# Patient Record
Sex: Male | Born: 1940 | Race: Black or African American | Hispanic: No | Marital: Married | Smoking: Never smoker
Health system: Southern US, Community
[De-identification: ages and names within clinical notes are randomized; demographics above are authoritative.]

## PROBLEM LIST (undated history)

## (undated) DIAGNOSIS — R413 Other amnesia: Secondary | ICD-10-CM

## (undated) DIAGNOSIS — E119 Type 2 diabetes mellitus without complications: Secondary | ICD-10-CM

## (undated) DIAGNOSIS — E785 Hyperlipidemia, unspecified: Secondary | ICD-10-CM

## (undated) DIAGNOSIS — R001 Bradycardia, unspecified: Secondary | ICD-10-CM

## (undated) DIAGNOSIS — G459 Transient cerebral ischemic attack, unspecified: Secondary | ICD-10-CM

## (undated) DIAGNOSIS — I10 Essential (primary) hypertension: Secondary | ICD-10-CM

## (undated) DIAGNOSIS — R011 Cardiac murmur, unspecified: Secondary | ICD-10-CM

## (undated) HISTORY — DX: Hyperlipidemia, unspecified: E78.5

## (undated) HISTORY — DX: Essential (primary) hypertension: I10

## (undated) HISTORY — DX: Cardiac murmur, unspecified: R01.1

## (undated) HISTORY — DX: Type 2 diabetes mellitus without complications: E11.9

## (undated) HISTORY — DX: Bradycardia, unspecified: R00.1

---

## 1998-08-19 ENCOUNTER — Ambulatory Visit (HOSPITAL_COMMUNITY): Admission: RE | Admit: 1998-08-19 | Discharge: 1998-08-19 | Payer: Self-pay | Admitting: Gastroenterology

## 1999-04-27 ENCOUNTER — Ambulatory Visit (HOSPITAL_COMMUNITY): Admission: EM | Admit: 1999-04-27 | Discharge: 1999-04-28 | Payer: Self-pay | Admitting: Ophthalmology

## 2001-11-15 ENCOUNTER — Ambulatory Visit (HOSPITAL_COMMUNITY): Admission: RE | Admit: 2001-11-15 | Discharge: 2001-11-15 | Payer: Self-pay | Admitting: Gastroenterology

## 2004-02-22 ENCOUNTER — Emergency Department (HOSPITAL_COMMUNITY): Admission: EM | Admit: 2004-02-22 | Discharge: 2004-02-22 | Payer: Self-pay | Admitting: Emergency Medicine

## 2005-03-28 ENCOUNTER — Emergency Department (HOSPITAL_COMMUNITY): Admission: EM | Admit: 2005-03-28 | Discharge: 2005-03-28 | Payer: Self-pay | Admitting: Emergency Medicine

## 2005-11-04 ENCOUNTER — Emergency Department (HOSPITAL_COMMUNITY): Admission: EM | Admit: 2005-11-04 | Discharge: 2005-11-04 | Payer: Self-pay | Admitting: Emergency Medicine

## 2009-05-22 ENCOUNTER — Ambulatory Visit (HOSPITAL_COMMUNITY): Admission: RE | Admit: 2009-05-22 | Discharge: 2009-05-22 | Payer: Self-pay | Admitting: Family Medicine

## 2011-02-25 NOTE — Procedures (Signed)
[place]. [facility]  Patient:    [name], [name] Visit Number: [id number] MRN: [id number]          Service Type: END Location: ENDO Attending Physician:  Charna Elizabeth Dictated by:   Anselmo Rod, M.D. Proc. Date: 11/15/01 Admit Date:  11/15/2001   CC:         Geraldo Pitter, M.D.                           Procedure Report  DATE OF BIRTH:  August 25, 1940  REFERRING PHYSICIAN:  Geraldo Pitter, M.D.  PROCEDURE PERFORMED:  Colonoscopy.  ENDOSCOPIST:  Anselmo Rod, M.D.  INSTRUMENT USED:  Olympus video colonoscope.  INDICATIONS FOR PROCEDURE:  Family history of colon cancer in a 70 year old African-American male. Rule out colonic polyps, masses, hemorrhoids, etc.  PREPROCEDURE PREPARATION:  Informed consent was procured from the patient. The patient was fasted for eight hours prior to the procedure and prepped with a bottle of magnesium citrate and a gallon of NuLytely the night prior to the procedure.  PREPROCEDURE PHYSICAL:  The patient had stable vital signs.  Neck supple. Chest clear to auscultation.  S1, S2 regular.  Abdomen soft with normal bowel sounds.  DESCRIPTION OF PROCEDURE:  The patient was placed in the left lateral decubitus position and sedated with 70 mg of Demerol and 6 mg of Versed intravenously.  Once the patient was adequately sedated and maintained on low-flow oxygen and continuous cardiac monitoring, the Olympus video colonoscope was advanced from the rectum to the cecum without difficulty.  No masses polyps, erosions, ulcerations or diverticula were seen.  There was no evidence of hemorrhoids.  The patient tolerated the procedure well.  The procedure was complete up to the cecum.  The ileocecal valve and the appendiceal orifice were clearly visualized and photographed.  IMPRESSION:  Normal colonoscopy.  RECOMMENDATIONS: 1. Repeat colorectal screening is recommended in the next five years unless    the patient  were to develop any abnormal symptoms in the interim. 2. A high fiber diet has been advocated with liberal fluid intake. 3. Outpatient follow-up is advised for a history of rectal bleeding ____________  hematochezia, melena, abnormal weight loss, change in    bowel habits etc.  This is advised on a p.r.n. basis. Dictated by:   Anselmo Rod, M.D. Attending Physician:  Charna Elizabeth DD:  11/15/01 TD:  11/15/01 Job: [id number]

## 2013-03-29 ENCOUNTER — Encounter: Payer: Self-pay | Admitting: Internal Medicine

## 2013-03-29 ENCOUNTER — Ambulatory Visit (INDEPENDENT_AMBULATORY_CARE_PROVIDER_SITE_OTHER): Payer: Medicare PPO | Admitting: Internal Medicine

## 2013-03-29 VITALS — BP 116/72 | HR 42 | Ht 71.0 in | Wt 183.7 lb

## 2013-03-29 DIAGNOSIS — R001 Bradycardia, unspecified: Secondary | ICD-10-CM | POA: Insufficient documentation

## 2013-03-29 DIAGNOSIS — I10 Essential (primary) hypertension: Secondary | ICD-10-CM | POA: Insufficient documentation

## 2013-03-29 DIAGNOSIS — I498 Other specified cardiac arrhythmias: Secondary | ICD-10-CM

## 2013-03-29 DIAGNOSIS — E785 Hyperlipidemia, unspecified: Secondary | ICD-10-CM

## 2013-03-29 NOTE — Progress Notes (Signed)
  OFFICE NOTE  Chief Complaint:  Routine followup  Primary Care Physician: Raymond Pitter, MD  HPI:  Raymond Burton is a 72 year old gentleman with a history of hypertension, dyslipidemia, and sinus bradycardia in the past. He has done very well over the past year without any new complaints. Overall, he has no complaints of chest pain, shortness of breath, palpitations, presyncope, syncopal symptoms. He continues to be asymptomatic with his bradycardia, noting that his heart rate does go up with exercise and at rest stays very low.  PMHx:  Past Medical History  Diagnosis Date  . Hyperlipidemia   . Murmur     2D ECHO, 02/04/2009 - EF 55-65%, normal  . Diabetes mellitus without complication   . Hypertension     NUCLEAR STRESS TEST, 07/17/2007 - EKG negative ischemia  . Bradycardia     History reviewed. No pertinent past surgical history.  FAMHx:  Family History  Problem Relation Age of Onset  . Heart attack Mother   . Stroke Mother     SOCHx:   reports that he has quit smoking. He has never used smokeless tobacco. He reports that he does not drink alcohol or use illicit drugs.  ALLERGIES:  No Known Allergies  ROS: A comprehensive review of systems was negative.  HOME MEDS: Current Outpatient Prescriptions  Medication Sig Dispense Refill  . amLODipine (NORVASC) 10 MG tablet Take 10 mg by mouth daily.      Marland Kitchen aspirin EC 81 MG tablet Take 81 mg by mouth daily.      . calcium-vitamin D (OSCAL WITH D) 500-200 MG-UNIT per tablet Take 1 tablet by mouth daily.      Marland Kitchen doxazosin (CARDURA) 2 MG tablet Take 2 mg by mouth daily.      . pravastatin (PRAVACHOL) 40 MG tablet Take 40 mg by mouth daily.      Marland Kitchen telmisartan-hydrochlorothiazide (MICARDIS HCT) 80-25 MG per tablet Take 1 tablet by mouth daily.       No current facility-administered medications for this visit.    LABS/IMAGING: No results found for this or any previous visit (from the past 48 hour(s)). No results  found.  VITALS: BP 116/72  Pulse 42  Ht 5\' 11"  (1.803 m)  Wt 183 lb 11.2 oz (83.326 kg)  BMI 25.63 kg/m2  EXAM: General appearance: alert and no distress Neck: no adenopathy, no carotid bruit, no JVD, supple, symmetrical, trachea midline and thyroid not enlarged, symmetric, no tenderness/mass/nodules Lungs: clear to auscultation bilaterally Heart: regular rate and rhythm, S1, S2 normal, no murmur, click, rub or gallop Abdomen: soft, non-tender; bowel sounds normal; no masses,  no organomegaly Extremities: extremities normal, atraumatic, no cyanosis or edema Pulses: 2+ and symmetric Skin: Skin color, texture, turgor normal. No rashes or lesions Neurologic: Grossly normal  EKG: Marked sinus bradycardia at 42  ASSESSMENT: 1. Asymptomatic sinus bradycardia with normal intervals and axes 2. Dyslipidemia-controlled 3. HTN - controlled  PLAN: 1.   Raymond Burton continues to do well and is asymptomatic with a sinus bradycardia. He continues to be able to elevate his heart rate with exercise, and suspect he has high vagal tone. He should continue with exercise, and medications to control high blood pressure. Overall he is doing well and we'll plan to see him back annually.  Raymond Nose, MD, [facility] Attending Cardiologist [facility]  Raymond Burton 03/29/2013, 9:52 AM

## 2013-03-29 NOTE — Patient Instructions (Addendum)
Your physician recommends that you schedule a follow-up appointment in: One year.  

## 2013-04-02 ENCOUNTER — Encounter: Payer: Self-pay | Admitting: Internal Medicine

## 2013-04-22 ENCOUNTER — Ambulatory Visit: Payer: Self-pay | Admitting: Internal Medicine

## 2014-04-01 ENCOUNTER — Telehealth: Payer: Self-pay | Admitting: Internal Medicine

## 2014-04-01 ENCOUNTER — Ambulatory Visit: Payer: Medicare PPO | Admitting: Internal Medicine

## 2014-04-02 NOTE — Telephone Encounter (Signed)
Closed encounter °

## 2014-04-03 ENCOUNTER — Ambulatory Visit: Payer: Medicare PPO | Admitting: Internal Medicine

## 2014-04-21 ENCOUNTER — Encounter: Payer: Self-pay | Admitting: Internal Medicine

## 2014-04-21 ENCOUNTER — Ambulatory Visit (INDEPENDENT_AMBULATORY_CARE_PROVIDER_SITE_OTHER): Payer: Medicare PPO | Admitting: Internal Medicine

## 2014-04-21 VITALS — BP 148/71 | HR 48 | Ht 71.5 in | Wt 187.0 lb

## 2014-04-21 DIAGNOSIS — E785 Hyperlipidemia, unspecified: Secondary | ICD-10-CM

## 2014-04-21 DIAGNOSIS — I498 Other specified cardiac arrhythmias: Secondary | ICD-10-CM

## 2014-04-21 DIAGNOSIS — R011 Cardiac murmur, unspecified: Secondary | ICD-10-CM

## 2014-04-21 DIAGNOSIS — R001 Bradycardia, unspecified: Secondary | ICD-10-CM

## 2014-04-21 DIAGNOSIS — I10 Essential (primary) hypertension: Secondary | ICD-10-CM

## 2014-04-21 MED ORDER — PRAVASTATIN SODIUM 40 MG PO TABS: 40.0000 mg | ORAL_TABLET | Freq: Every day | ORAL | Status: DC

## 2014-04-21 NOTE — Patient Instructions (Signed)
Your physician wants you to follow-up in: 1 year with Dr. Hilty. You will receive a reminder letter in the mail two months in advance. If you don't receive a letter, please call our office to schedule the follow-up appointment.  

## 2014-04-21 NOTE — Progress Notes (Signed)
OFFICE NOTE  Chief Complaint:  Routine followup  Primary Care Physician: Geraldo PitterBLAND,VEITA J, MD  HPI:  Raymond Burton is a 73 year old gentleman with a history of hypertension, dyslipidemia, and sinus bradycardia in the past. He has done very well over the past year without any new complaints. Overall, he has no complaints of chest pain, shortness of breath, palpitations, presyncope, syncopal symptoms. He continues to be asymptomatic with his bradycardia, noting that his heart rate does go up with exercise and at rest stays very low. Continues to be active is doing work around the yard. He said a couple of tick bite in the recent past and is taking doxycycline for this. Overall he has no complaints.  PMHx:  Past Medical History  Diagnosis Date  . Hyperlipidemia   . Murmur     2D ECHO, 02/04/2009 - EF 55-65%, normal  . Diabetes mellitus without complication   . Hypertension     NUCLEAR STRESS TEST, 07/17/2007 - EKG negative ischemia  . Bradycardia     History reviewed. No pertinent past surgical history.  FAMHx:  Family History  Problem Relation Age of Onset  . Heart attack Mother   . Stroke Mother     SOCHx:   reports that he has never smoked. He has never used smokeless tobacco. He reports that he does not drink alcohol or use illicit drugs.  ALLERGIES:  No Known Allergies  ROS: A comprehensive review of systems was negative.  HOME MEDS: Current Outpatient Prescriptions  Medication Sig Dispense Refill  . amLODipine (NORVASC) 10 MG tablet Take 10 mg by mouth daily.      Marland Kitchen. aspirin EC 81 MG tablet Take 81 mg by mouth daily.      . calcium-vitamin D (OSCAL WITH D) 500-200 MG-UNIT per tablet Take 1 tablet by mouth daily.      Marland Kitchen. doxazosin (CARDURA) 2 MG tablet Take 2 mg by mouth daily.      . pravastatin (PRAVACHOL) 40 MG tablet Take 40 mg by mouth daily.      Marland Kitchen. telmisartan-hydrochlorothiazide (MICARDIS HCT) 80-25 MG per tablet Take 1 tablet by mouth daily.        No current facility-administered medications for this visit.    LABS/IMAGING: No results found for this or any previous visit (from the past 48 hour(s)). No results found.  VITALS: BP 148/71  Pulse 48  Ht 5' 11.5" (1.816 m)  Wt 187 lb (84.823 kg)  BMI 25.72 kg/m2  EXAM: General appearance: alert and no distress Neck: no adenopathy, no carotid bruit, no JVD, supple, symmetrical, trachea midline and thyroid not enlarged, symmetric, no tenderness/mass/nodules Lungs: clear to auscultation bilaterally Heart: regular rate and rhythm, S1, S2 normal and systolic murmur: early systolic 2/6, blowing at apex Abdomen: soft, non-tender; bowel sounds normal; no masses,  no organomegaly Extremities: extremities normal, atraumatic, no cyanosis or edema Pulses: 2+ and symmetric Skin: Skin color, texture, turgor normal. No rashes or lesions Neurologic: Grossly normal  EKG: Sinus bradycardia at 48  ASSESSMENT: 1. Asymptomatic sinus bradycardia with normal intervals and axes 2. Dyslipidemia-controlled 3. HTN - controlled 4. Murmur- mild MR, stable  PLAN: 1.   [name] continues to do well and is asymptomatic with a sinus bradycardia. He continues to be able to elevate his heart rate with exercise, and suspect he has high vagal tone. He should continue with exercise, and medications to control high blood pressure. Cholesterol is being managed by his primary care provider. He does have a  soft systolic murmur which is mild mitral regurgitation and seems stable. Overall he is doing well and we'll plan to see him back annually.  Chrystie Nose, MD, [facility] Attending Cardiologist [facility]  Harrol Novello C 04/21/2014, 10:58 AM

## 2014-12-03 ENCOUNTER — Other Ambulatory Visit (HOSPITAL_COMMUNITY): Payer: Self-pay | Admitting: Family Medicine

## 2014-12-03 ENCOUNTER — Ambulatory Visit (HOSPITAL_COMMUNITY)
Admission: RE | Admit: 2014-12-03 | Discharge: 2014-12-03 | Disposition: A | Discharge status: Home / Self Care | Payer: Medicare PPO | Source: Ambulatory Visit | Attending: Family Medicine | Admitting: Family Medicine

## 2014-12-03 DIAGNOSIS — W208XXA Other cause of strike by thrown, projected or falling object, initial encounter: Secondary | ICD-10-CM | POA: Diagnosis not present

## 2014-12-03 DIAGNOSIS — S92415A Nondisplaced fracture of proximal phalanx of left great toe, initial encounter for closed fracture: Secondary | ICD-10-CM | POA: Insufficient documentation

## 2014-12-03 DIAGNOSIS — T148XXA Other injury of unspecified body region, initial encounter: Secondary | ICD-10-CM

## 2014-12-03 DIAGNOSIS — M79675 Pain in left toe(s): Secondary | ICD-10-CM | POA: Diagnosis present

## 2015-03-25 DIAGNOSIS — N401 Enlarged prostate with lower urinary tract symptoms: Secondary | ICD-10-CM | POA: Diagnosis not present

## 2015-03-25 DIAGNOSIS — R351 Nocturia: Secondary | ICD-10-CM | POA: Diagnosis not present

## 2015-03-27 DIAGNOSIS — I10 Essential (primary) hypertension: Secondary | ICD-10-CM | POA: Diagnosis not present

## 2015-03-27 DIAGNOSIS — E119 Type 2 diabetes mellitus without complications: Secondary | ICD-10-CM | POA: Diagnosis not present

## 2015-03-27 DIAGNOSIS — E780 Pure hypercholesterolemia: Secondary | ICD-10-CM | POA: Diagnosis not present

## 2015-04-20 DIAGNOSIS — S30860A Insect bite (nonvenomous) of lower back and pelvis, initial encounter: Secondary | ICD-10-CM | POA: Diagnosis not present

## 2015-04-21 ENCOUNTER — Encounter: Payer: Self-pay | Admitting: Internal Medicine

## 2015-04-21 ENCOUNTER — Ambulatory Visit (INDEPENDENT_AMBULATORY_CARE_PROVIDER_SITE_OTHER): Payer: Medicare PPO | Admitting: Internal Medicine

## 2015-04-21 VITALS — BP 124/72 | HR 49 | Ht 71.0 in | Wt 182.7 lb

## 2015-04-21 DIAGNOSIS — H5202 Hypermetropia, left eye: Secondary | ICD-10-CM | POA: Diagnosis not present

## 2015-04-21 DIAGNOSIS — E785 Hyperlipidemia, unspecified: Secondary | ICD-10-CM

## 2015-04-21 DIAGNOSIS — I10 Essential (primary) hypertension: Secondary | ICD-10-CM

## 2015-04-21 DIAGNOSIS — H18419 Arcus senilis, unspecified eye: Secondary | ICD-10-CM | POA: Diagnosis not present

## 2015-04-21 DIAGNOSIS — H35413 Lattice degeneration of retina, bilateral: Secondary | ICD-10-CM | POA: Diagnosis not present

## 2015-04-21 DIAGNOSIS — Z961 Presence of intraocular lens: Secondary | ICD-10-CM | POA: Diagnosis not present

## 2015-04-21 DIAGNOSIS — R001 Bradycardia, unspecified: Secondary | ICD-10-CM | POA: Diagnosis not present

## 2015-04-21 DIAGNOSIS — R011 Cardiac murmur, unspecified: Secondary | ICD-10-CM | POA: Diagnosis not present

## 2015-04-21 DIAGNOSIS — H5211 Myopia, right eye: Secondary | ICD-10-CM | POA: Diagnosis not present

## 2015-04-21 DIAGNOSIS — H43392 Other vitreous opacities, left eye: Secondary | ICD-10-CM | POA: Diagnosis not present

## 2015-04-21 DIAGNOSIS — H11021 Central pterygium of right eye: Secondary | ICD-10-CM | POA: Diagnosis not present

## 2015-04-21 DIAGNOSIS — H52223 Regular astigmatism, bilateral: Secondary | ICD-10-CM | POA: Diagnosis not present

## 2015-04-21 NOTE — Patient Instructions (Signed)
Your physician wants you to follow-up in: 1 year with Dr. Hilty. You will receive a reminder letter in the mail two months in advance. If you don't receive a letter, please call our office to schedule the follow-up appointment.  

## 2015-04-21 NOTE — Progress Notes (Signed)
OFFICE NOTE  Chief Complaint:  Routine followup  Primary Care Physician: Geraldo Pitter, MD  HPI:  Raymond Burton is a 74 year old gentleman with a history of hypertension, dyslipidemia, and sinus bradycardia in the past. He has done very well over the past year without any new complaints. Overall, he has no complaints of chest pain, shortness of breath, palpitations, presyncope, syncopal symptoms. He continues to be asymptomatic with his bradycardia, noting that his heart rate does go up with exercise and at rest stays very low. Continues to be active is doing work around the yard. He said a couple of tick bite in the recent past and is taking doxycycline for this. Overall he has no complaints.  Mr. Giannini is without complaints today. Blood pressure is well-controlled. Heart rate is still the 40s but he is asymptomatic. This does increase with exercise. He had some questions about his Cialis medicine, specifically he read about a side effect of blindness. I'm not aware of the side effect and I think it must be very infrequent. I think it is safe for him to continue to take this which should help with both prostate enlargement as well as erectile dysfunction.  PMHx:  Past Medical History  Diagnosis Date  . Hyperlipidemia   . Murmur     2D ECHO, 02/04/2009 - EF 55-65%, normal  . Diabetes mellitus without complication   . Hypertension     NUCLEAR STRESS TEST, 07/17/2007 - EKG negative ischemia  . Bradycardia     No past surgical history on file.  FAMHx:  Family History  Problem Relation Age of Onset  . Heart attack Mother   . Stroke Mother     SOCHx:   reports that he has never smoked. He has never used smokeless tobacco. He reports that he does not drink alcohol or use illicit drugs.  ALLERGIES:  No Known Allergies  ROS: A comprehensive review of systems was negative.  HOME MEDS: Current Outpatient Prescriptions  Medication Sig Dispense Refill  . amLODipine  (NORVASC) 10 MG tablet Take 10 mg by mouth daily.    Marland Kitchen aspirin EC 81 MG tablet Take 81 mg by mouth daily.    Marland Kitchen doxazosin (CARDURA) 2 MG tablet Take 2 mg by mouth daily.    . Garlic (GARLIQUE PO) Take by mouth daily.    . Multiple Minerals-Vitamins (CALCIUM-MAGNESIUM-ZINC-D3) TABS Take by mouth daily.    . pravastatin (PRAVACHOL) 40 MG tablet Take 1 tablet (40 mg total) by mouth daily. 90 tablet 3  . tadalafil (CIALIS) 5 MG tablet Take 5 mg by mouth daily as needed for erectile dysfunction.    Marland Kitchen telmisartan-hydrochlorothiazide (MICARDIS HCT) 80-25 MG per tablet Take 1 tablet by mouth daily.     No current facility-administered medications for this visit.    LABS/IMAGING: No results found for this or any previous visit (from the past 48 hour(s)). No results found.  VITALS: BP 124/72 mmHg  Pulse 49  Ht  (1.803 m)  Wt 182 lb 11.2 oz (82.872 kg)  BMI 25.49 kg/m2  EXAM: General appearance: alert and no distress Neck: no adenopathy, no carotid bruit, no JVD, supple, symmetrical, trachea midline and thyroid not enlarged, symmetric, no tenderness/mass/nodules Lungs: clear to auscultation bilaterally Heart: regular rate and rhythm, S1, S2 normal and systolic murmur: early systolic 2/6, blowing at apex Abdomen: soft, non-tender; bowel sounds normal; no masses,  no organomegaly Extremities: extremities normal, atraumatic, no cyanosis or edema Pulses: 2+ and symmetric Skin: Skin color,  texture, turgor normal. No rashes or lesions Neurologic: Grossly normal  EKG: Sinus bradycardia at 49  ASSESSMENT: 1. Asymptomatic sinus bradycardia with normal intervals and axes 2. Dyslipidemia-controlled 3. HTN - controlled 4. Murmur- mild MR, stable 5. ED/BPH  PLAN: 1.   [name] continues to do well and is asymptomatic with a sinus bradycardia. He continues to be able to elevate his heart rate with exercise, and suspect he has high vagal tone. He should continue with exercise, and  medications to control high blood pressure. Cholesterol is being managed by his primary care provider. He does have a soft systolic murmur which is mild mitral regurgitation and seems stable. Overall he is doing well and we'll plan to see him back annually.  Chrystie NoseKenneth C. Hilty, MD, [facility] Attending Cardiologist [facility]  Lisette AbuKenneth C [facility] 04/21/2015, 11:15 AM

## 2015-06-05 ENCOUNTER — Other Ambulatory Visit: Payer: Self-pay | Admitting: Internal Medicine

## 2015-06-05 NOTE — Telephone Encounter (Signed)
Rx request sent to pharmacy.  

## 2015-07-21 DIAGNOSIS — I10 Essential (primary) hypertension: Secondary | ICD-10-CM | POA: Diagnosis not present

## 2015-07-21 DIAGNOSIS — E119 Type 2 diabetes mellitus without complications: Secondary | ICD-10-CM | POA: Diagnosis not present

## 2015-07-28 DIAGNOSIS — E785 Hyperlipidemia, unspecified: Secondary | ICD-10-CM | POA: Diagnosis not present

## 2015-07-28 DIAGNOSIS — E119 Type 2 diabetes mellitus without complications: Secondary | ICD-10-CM | POA: Diagnosis not present

## 2015-07-28 DIAGNOSIS — I10 Essential (primary) hypertension: Secondary | ICD-10-CM | POA: Diagnosis not present

## 2015-07-28 DIAGNOSIS — Z6825 Body mass index (BMI) 25.0-25.9, adult: Secondary | ICD-10-CM | POA: Diagnosis not present

## 2015-10-01 ENCOUNTER — Other Ambulatory Visit: Payer: Self-pay | Admitting: Internal Medicine

## 2015-10-01 NOTE — Telephone Encounter (Signed)
REFILL 

## 2015-12-02 ENCOUNTER — Ambulatory Visit (HOSPITAL_BASED_OUTPATIENT_CLINIC_OR_DEPARTMENT_OTHER): Payer: Medicare Other | Attending: Family Medicine | Admitting: Radiology

## 2015-12-02 VITALS — Ht 71.0 in | Wt 190.0 lb

## 2015-12-02 DIAGNOSIS — G4733 Obstructive sleep apnea (adult) (pediatric): Secondary | ICD-10-CM | POA: Diagnosis not present

## 2015-12-02 DIAGNOSIS — R0683 Snoring: Secondary | ICD-10-CM | POA: Insufficient documentation

## 2015-12-02 DIAGNOSIS — Z79899 Other long term (current) drug therapy: Secondary | ICD-10-CM | POA: Diagnosis not present

## 2015-12-02 DIAGNOSIS — I10 Essential (primary) hypertension: Secondary | ICD-10-CM | POA: Diagnosis not present

## 2015-12-06 DIAGNOSIS — G4733 Obstructive sleep apnea (adult) (pediatric): Secondary | ICD-10-CM | POA: Diagnosis not present

## 2015-12-06 NOTE — Progress Notes (Signed)
   Patient Name: Raymond Burton, Raymond Burton Date: 12/02/2015 Gender: Male D.O.B: 04/15/1941 Age (years): 53 Referring Provider: Renaye Rakers Height (inches): 71 Interpreting Physician: Jetty Duhamel MD, ABSM Weight (lbs): 190 RPSGT: Ulyess Mort BMI: 26 MRN: [id number] Neck Size: 15.50 CLINICAL INFORMATION Sleep Study Type: NPSG Indication for sleep study: Hypertension, OSA Epworth Sleepiness Score:  SLEEP STUDY TECHNIQUE As per the AASM Manual for the Scoring of Sleep and Associated Events v2.3 (April 2016) with a hypopnea requiring 4% desaturations. The channels recorded and monitored were frontal, central and occipital EEG, electrooculogram (EOG), submentalis EMG (chin), nasal and oral airflow, thoracic and abdominal wall motion, anterior tibialis EMG, snore microphone, electrocardiogram, and pulse oximetry.  MEDICATIONS Patient's medications include: charted for review Medications self-administered by patient during sleep study :AMLODIPINE, DOXAZOSIN MESYLATE, PRAVASTATIN, telmisartan-hctz.  SLEEP ARCHITECTURE The study was initiated at 11:08:47 PM and ended at 5:13:44 AM. Sleep onset time was 1.9 minutes and the sleep efficiency was 92.7%. The total sleep time was 338.5 minutes. Stage REM latency was 50.5 minutes. The patient spent 14.92% of the night in stage N1 sleep, 58.35% in stage N2 sleep, 0.00% in stage N3 and 26.74% in REM. Alpha intrusion was absent. Supine sleep was 80.96%.  RESPIRATORY PARAMETERS The overall apnea/hypopnea index (AHI) was 10.3 per hour. There were 8 total apneas, including 2 obstructive, 4 central and 2 mixed apneas. There were 50 hypopneas and 22 RERAs. The AHI during Stage REM sleep was 5.3 per hour. AHI while supine was 12.7 per hour. The mean oxygen saturation was 93.85%. The minimum SpO2 during sleep was 87.00%. Moderate snoring was noted during this study.  CARDIAC DATA The 2 lead EKG demonstrated sinus rhythm. The mean heart rate was  47.41 beats per minute. Other EKG findings include: None.  LEG MOVEMENT DATA The total PLMS were 4 with a resulting PLMS index of 0.71. Associated arousal with leg movement index was 0.5 .  IMPRESSIONS - Mild obstructive sleep apnea occurred during this study (AHI = 10.3/h). - There were not enough early events to meet protocol requirements for split protocol CPAP titrration. - No significant central sleep apnea occurred during this study (CAI = 0.7/h). - Mild oxygen desaturation was noted during this study (Min O2 = 87.00%). - The patient snored with Moderate snoring volume. - No cardiac abnormalities were noted during this study. - Clinically significant periodic limb movements did not occur during sleep. No significant associated arousals.  DIAGNOSIS - Obstructive Sleep Apnea (327.23 [G47.33 ICD-10]  RECOMMENDATIONS - Therapeutic CPAP titration to determine optimal pressure required to alleviate sleep disordered breathing. - Therapeutic alternatives might include a fitted oral appliance or ENT surgical evaluation. - Positional therapy avoiding supine position during sleep. - Avoid alcohol, sedatives and other CNS depressants that may worsen sleep apnea and disrupt normal sleep architecture. - Sleep hygiene should be reviewed to assess factors that may improve sleep quality. - Weight management and regular exercise should be initiated or continued if appropriate.   Waymon Budge Diplomate, American Board of Sleep Medicine  ELECTRONICALLY SIGNED ON:  12/06/2015, 9:57 AM [facility] PH: [phone]   FX: [phone] ACCREDITED BY THE AMERICAN ACADEMY OF SLEEP MEDICINE

## 2016-01-11 ENCOUNTER — Emergency Department (HOSPITAL_COMMUNITY): Payer: Medicare Other

## 2016-01-11 ENCOUNTER — Emergency Department (HOSPITAL_COMMUNITY)
Admission: EM | Admit: 2016-01-11 | Discharge: 2016-01-11 | Disposition: A | Discharge status: Home / Self Care | Payer: Medicare Other | Attending: Emergency Medicine | Admitting: Emergency Medicine

## 2016-01-11 ENCOUNTER — Encounter (HOSPITAL_COMMUNITY): Payer: Self-pay | Admitting: Emergency Medicine

## 2016-01-11 DIAGNOSIS — S8012XA Contusion of left lower leg, initial encounter: Secondary | ICD-10-CM | POA: Diagnosis not present

## 2016-01-11 DIAGNOSIS — E119 Type 2 diabetes mellitus without complications: Secondary | ICD-10-CM | POA: Diagnosis not present

## 2016-01-11 DIAGNOSIS — T148XXA Other injury of unspecified body region, initial encounter: Secondary | ICD-10-CM

## 2016-01-11 DIAGNOSIS — S8992XA Unspecified injury of left lower leg, initial encounter: Secondary | ICD-10-CM | POA: Diagnosis present

## 2016-01-11 DIAGNOSIS — Z7982 Long term (current) use of aspirin: Secondary | ICD-10-CM | POA: Insufficient documentation

## 2016-01-11 DIAGNOSIS — Y9389 Activity, other specified: Secondary | ICD-10-CM | POA: Insufficient documentation

## 2016-01-11 DIAGNOSIS — W28XXXA Contact with powered lawn mower, initial encounter: Secondary | ICD-10-CM | POA: Insufficient documentation

## 2016-01-11 DIAGNOSIS — E785 Hyperlipidemia, unspecified: Secondary | ICD-10-CM | POA: Insufficient documentation

## 2016-01-11 DIAGNOSIS — Y999 Unspecified external cause status: Secondary | ICD-10-CM | POA: Insufficient documentation

## 2016-01-11 DIAGNOSIS — I10 Essential (primary) hypertension: Secondary | ICD-10-CM | POA: Diagnosis not present

## 2016-01-11 DIAGNOSIS — Z79899 Other long term (current) drug therapy: Secondary | ICD-10-CM | POA: Insufficient documentation

## 2016-01-11 DIAGNOSIS — Y929 Unspecified place or not applicable: Secondary | ICD-10-CM | POA: Diagnosis not present

## 2016-01-11 NOTE — ED Provider Notes (Signed)
CSN: [id number]     Arrival date & time 01/11/16  [id number] History   First MD Initiated Contact with Patient 01/11/16 [phone]     Chief Complaint  Patient presents with  . Ankle Pain     (Consider location/radiation/quality/duration/timing/severity/associated sxs/prior Treatment) HPI Comments: Patient states he was loading a riding lawnmower into a truck "about March 23" when it fell onto his left leg. He immediately had left knee pain and swelling as well as some ankle pain and swelling at the time. He did not seek medical treatment. He states that the swelling is improved but still persistent. The swelling has persisted on his leg into his ankle. He is able to walk. There is no numbness, tingling, weakness. No fever or chills. He's not been taking anything for the pain. No chest pain or shortness of breath. He does not take any blood thinners. He takes a small aspirin.  The history is provided by the patient and the spouse.    Past Medical History  Diagnosis Date  . Hyperlipidemia   . Murmur     2D ECHO, 02/04/2009 - EF 55-65%, normal  . Diabetes mellitus without complication (HCC)   . Hypertension     NUCLEAR STRESS TEST, 07/17/2007 - EKG negative ischemia  . Bradycardia    No past surgical history on file. Family History  Problem Relation Age of Onset  . Heart attack Mother   . Stroke Mother    Social History  Substance Use Topics  . Smoking status: Never Smoker   . Smokeless tobacco: Never Used     Comment: "smoked for about 2 weeks, gave him a headache"  . Alcohol Use: No    Review of Systems  Constitutional: Negative for fever, activity change and appetite change.  HENT: Negative for congestion and rhinorrhea.   Respiratory: Negative for chest tightness and shortness of breath.   Cardiovascular: Positive for leg swelling. Negative for chest pain.  Gastrointestinal: Negative for nausea, vomiting and abdominal pain.  Genitourinary: Negative for dysuria, urgency and hematuria.   Musculoskeletal: Positive for myalgias and arthralgias.  Skin: Negative for rash.  Neurological: Negative for dizziness and light-headedness.  A complete 10 system review of systems was obtained and all systems are negative except as noted in the HPI and PMH.      Allergies  Review of patient's allergies indicates no known allergies.  Home Medications   Prior to Admission medications   Medication Sig Start Date End Date Taking? Authorizing Provider  amLODipine (NORVASC) 10 MG tablet Take 10 mg by mouth daily. 03/25/13  Yes Historical Provider, MD  aspirin EC 81 MG tablet Take 81 mg by mouth daily.   Yes Historical Provider, MD  doxazosin (CARDURA) 2 MG tablet Take 2 mg by mouth daily. 03/25/13  Yes Historical Provider, MD  Garlic (GARLIQUE PO) Take by mouth daily.   Yes Historical Provider, MD  Multiple Minerals-Vitamins (CALCIUM-MAGNESIUM-ZINC-D3) TABS Take by mouth daily.   Yes Historical Provider, MD  pravastatin (PRAVACHOL) 40 MG tablet TAKE 1 TABLET BY MOUTH ONCE DAILY. 10/01/15  Yes Chrystie NoseKenneth C Hilty, MD  tadalafil (CIALIS) 5 MG tablet Take 5 mg by mouth daily as needed for erectile dysfunction.   Yes Historical Provider, MD  telmisartan-hydrochlorothiazide (MICARDIS HCT) 80-25 MG per tablet Take 1 tablet by mouth daily. 01/28/13  Yes Historical Provider, MD   BP 145/82 mmHg  Pulse 58  Temp(Src) 97.6 F (36.4 C) (Oral)  Resp 18  Ht 5\' 11"  (1.803 m)  Wt  180 lb (81.647 kg)  BMI 25.12 kg/m2  SpO2 100% Physical Exam  Constitutional: He is oriented to person, place, and time. He appears well-developed and well-nourished. No distress.  HENT:  Head: Normocephalic and atraumatic.  Mouth/Throat: Oropharynx is clear and moist. No oropharyngeal exudate.  Eyes: Conjunctivae and EOM are normal. Pupils are equal, round, and reactive to light.  Neck: Normal range of motion. Neck supple.  No meningismus.  Cardiovascular: Normal rate, regular rhythm, normal heart sounds and intact distal  pulses.   No murmur heard. Pulmonary/Chest: Effort normal and breath sounds normal. No respiratory distress.  Abdominal: Soft. There is no tenderness. There is no rebound and no guarding.  Musculoskeletal: Normal range of motion. He exhibits edema and tenderness.  There is swelling and ecchymosis to the left medial knee. Ecchymosis to the posterior distal thigh and proximal calf. Intact range of motion of knee with full range of motion and no bony tenderness. Swelling to left ankle and dorsal foot diffusely. Intact DP and PT pulses. Compartments of upper and lower leg are soft. Able to flex and extend ankle. Able to walk without difficulty.  Entire left leg is swollen compared to right leg.   Neurological: He is alert and oriented to person, place, and time. No cranial nerve deficit. He exhibits normal muscle tone. Coordination normal.  No ataxia on finger to nose bilaterally. No pronator drift. 5/5 strength throughout. CN 2-12 intact.Equal grip strength. Sensation intact.   Skin: Skin is warm.  Psychiatric: He has a normal mood and affect. His behavior is normal.  Nursing note and vitals reviewed.   ED Course  Procedures (including critical care time) Labs Review Labs Reviewed - No data to display  Imaging Review Dg Tibia/fibula Left  01/11/2016  CLINICAL DATA:  Pain and swelling EXAM: LEFT TIBIA AND FIBULA - 2 VIEW COMPARISON:  None. FINDINGS: No acute fracture. No dislocation. Soft tissue swelling is noted in the medial distal thigh. IMPRESSION: No acute bony pathology. Electronically Signed   By: Jolaine Click M.D.   On: 01/11/2016 08:56   Dg Ankle Complete Left  01/11/2016  CLINICAL DATA:  Leg swelling after trauma by lawnmower. Initial encounter. EXAM: LEFT ANKLE COMPLETE - 3+ VIEW COMPARISON:  None. FINDINGS: Soft tissue swelling without fracture, dislocation, or joint effusion. IMPRESSION: Soft tissue swelling without osseous abnormality. Electronically Signed   By: Marnee Spring  M.D.   On: 01/11/2016 08:57   US Venous Img Lower Unilateral Left  01/11/2016  CLINICAL DATA:  Left lower extremity swelling from foot to knee since a recent injury on 12/31/2015 (lawnmower fell on leg). EXAM: Choose 1 LOWER EXTREMITY VENOUS DOPPLER ULTRASOUND TECHNIQUE: Gray-scale sonography with graded compression, as well as color Doppler and duplex ultrasound were performed to evaluate the lower extremity deep venous systems from the level of the common femoral vein and including the common femoral, femoral, profunda femoral, popliteal and calf veins including the posterior tibial, peroneal and gastrocnemius veins when visible. The superficial great saphenous vein was also interrogated. Spectral Doppler was utilized to evaluate flow at rest and with distal augmentation maneuvers in the common femoral, femoral and popliteal veins. COMPARISON:  None. FINDINGS: Contralateral Common Femoral Vein: Respiratory phasicity is normal and symmetric with the symptomatic side. No evidence of thrombus. Normal compressibility. Common Femoral Vein: No evidence of thrombus. Normal compressibility, respiratory phasicity and response to augmentation. Saphenofemoral Junction: No evidence of thrombus. Normal compressibility and flow on color Doppler imaging. Profunda Femoral Vein: No evidence of thrombus.  Normal compressibility and flow on color Doppler imaging. Femoral Vein: No evidence of thrombus. Normal compressibility, respiratory phasicity and response to augmentation. Popliteal Vein: No evidence of thrombus. Normal compressibility, respiratory phasicity and response to augmentation. Calf Veins: No thrombus identified. Limited assessment of the peroneal veins. Superficial Great Saphenous Vein: No evidence of thrombus. Normal compressibility and flow on color Doppler imaging. Venous Reflux:  None. Other Findings: Prominent edema throughout the left calf. There is a 9.2 x 1.9 x 7.0 cm heterogeneously hypoechoic collection  without evidence of internal vascularity in the medial aspect of the distal left thigh. An incidental, normal appearing lymph node was noted measuring 2.8 x 1.0 x 2.0 cm in the left groin. IMPRESSION: 1. No evidence of deep venous thrombosis. 2. 9.2 cm complex collection in the distal left thigh compatible with a hematoma related to recent injury. 3. Prominent left calf edema. Electronically Signed   By: Sebastian Ache M.D.   On: 01/11/2016 10:01   Dg Knee Complete 4 Views Left  01/11/2016  CLINICAL DATA:  Left knee pain and swelling. Erythema. Lawnmower fell off truck and hit his leg. Initial encounter. EXAM: LEFT KNEE - COMPLETE 4+ VIEW COMPARISON:  None. FINDINGS: Marked soft tissue swelling is evident medial to the distal femur. There is no underlying fracture. The joint is located. There is no significant effusion. An anterior medial enchondroma is noted. IMPRESSION: 1. Prominent medial soft tissue swelling. Subcutaneous or intramuscular hematoma is not excluded. 2. No acute osseous abnormality. Electronically Signed   By: Marin Roberts M.D.   On: 01/11/2016 09:00   I have personally reviewed and evaluated these images and lab results as part of my medical decision-making.   EKG Interpretation None      MDM   Final diagnoses:  Hematoma  Leg injury, left, initial encounter   Left leg pain and swelling after blunt trauma 2 weeks ago. Neurovascularly intact. We'll obtain x-rays. Given amount of swelling will obtain Doppler to evaluate for DVT.  X-rays negative. Dopplers negative for DVT but there is a large hematoma to the distal left thigh. Patient's pain is controlled. He has intact DP and PT pulses. His compartments are soft. Advised elevation, ice, rest, follow-up with PCP and orthopedics as needed. Return precautions discussed.   No evidence of compartment syndrome at this time. He declines pain medication.      Glynn Octave, MD 01/11/16 [phone]

## 2016-01-11 NOTE — Discharge Instructions (Signed)
Hematoma Keep your leg elevated. Apply ice as discussed. Rest. Follow-up with your PCP and orthopedic doctor. Return to the ED with worsening pain, chills, numbness and tingling, weakness or any other concerns. A hematoma is a collection of blood under the skin, in an organ, in a body space, in a joint space, or in other tissue. The blood can clot to form a lump that you can see and feel. The lump is often firm and may sometimes become sore and tender. Most hematomas get better in a few days to weeks. However, some hematomas may be serious and require medical care. Hematomas can range in size from very small to very large. CAUSES  A hematoma can be caused by a blunt or penetrating injury. It can also be caused by spontaneous leakage from a blood vessel under the skin. Spontaneous leakage from a blood vessel is more likely to occur in older people, especially those taking blood thinners. Sometimes, a hematoma can develop after certain medical procedures. SIGNS AND SYMPTOMS   A firm lump on the body.  Possible pain and tenderness in the area.  Bruising.Blue, dark blue, purple-red, or yellowish skin may appear at the site of the hematoma if the hematoma is close to the surface of the skin. For hematomas in deeper tissues or body spaces, the signs and symptoms may be subtle. For example, an intra-abdominal hematoma may cause abdominal pain, weakness, fainting, and shortness of breath. An intracranial hematoma may cause a headache or symptoms such as weakness, trouble speaking, or a change in consciousness. DIAGNOSIS  A hematoma can usually be diagnosed based on your medical history and a physical exam. Imaging tests may be needed if your health care provider suspects a hematoma in deeper tissues or body spaces, such as the abdomen, head, or chest. These tests may include ultrasonography or a CT scan.  TREATMENT  Hematomas usually go away on their own over time. Rarely does the blood need to be drained  out of the body. Large hematomas or those that may affect vital organs will sometimes need surgical drainage or monitoring. HOME CARE INSTRUCTIONS   Apply ice to the injured area:   Put ice in a plastic bag.   Place a towel between your skin and the bag.   Leave the ice on for 20 minutes, 2-3 times a day for the first 1 to 2 days.   After the first 2 days, switch to using warm compresses on the hematoma.   Elevate the injured area to help decrease pain and swelling. Wrapping the area with an elastic bandage may also be helpful. Compression helps to reduce swelling and promotes shrinking of the hematoma. Make sure the bandage is not wrapped too tight.   If your hematoma is on a lower extremity and is painful, crutches may be helpful for a couple days.   Only take over-the-counter or prescription medicines as directed by your health care provider. SEEK IMMEDIATE MEDICAL CARE IF:   You have increasing pain, or your pain is not controlled with medicine.   You have a fever.   You have worsening swelling or discoloration.   Your skin over the hematoma breaks or starts bleeding.   Your hematoma is in your chest or abdomen and you have weakness, shortness of breath, or a change in consciousness.  Your hematoma is on your scalp (caused by a fall or injury) and you have a worsening headache or a change in alertness or consciousness. MAKE SURE YOU:  Understand these instructions.  Will watch your condition.  Will get help right away if you are not doing well or get worse.   This information is not intended to replace advice given to you by your health care provider. Make sure you discuss any questions you have with your health care provider.   Document Released: 05/10/2004 Document Revised: 05/29/2013 Document Reviewed: 03/06/2013 Elsevier Interactive Patient Education [employer].

## 2016-01-11 NOTE — ED Notes (Signed)
Pt states his riding lawn mower fell onto his left leg 3/23. Pt c/o continued left ankle pain and intermittent swelling. Pt ambulated to exam room from triage independently. nad noted.

## 2016-06-22 ENCOUNTER — Other Ambulatory Visit (HOSPITAL_COMMUNITY): Payer: Self-pay | Admitting: Family Medicine

## 2016-06-22 ENCOUNTER — Ambulatory Visit (HOSPITAL_COMMUNITY)
Admission: RE | Admit: 2016-06-22 | Discharge: 2016-06-22 | Disposition: A | Discharge status: Home / Self Care | Payer: Medicare Other | Source: Ambulatory Visit | Attending: Family Medicine | Admitting: Family Medicine

## 2016-06-22 DIAGNOSIS — T148XXA Other injury of unspecified body region, initial encounter: Secondary | ICD-10-CM

## 2016-06-22 DIAGNOSIS — S99912A Unspecified injury of left ankle, initial encounter: Secondary | ICD-10-CM | POA: Diagnosis present

## 2016-06-22 DIAGNOSIS — M7989 Other specified soft tissue disorders: Secondary | ICD-10-CM | POA: Diagnosis not present

## 2016-06-29 ENCOUNTER — Ambulatory Visit (INDEPENDENT_AMBULATORY_CARE_PROVIDER_SITE_OTHER): Payer: Medicare Other | Admitting: Internal Medicine

## 2016-06-29 ENCOUNTER — Encounter: Payer: Self-pay | Admitting: Internal Medicine

## 2016-06-29 VITALS — BP 124/72 | HR 49 | Ht 71.0 in | Wt 187.6 lb

## 2016-06-29 DIAGNOSIS — R600 Localized edema: Secondary | ICD-10-CM

## 2016-06-29 DIAGNOSIS — I10 Essential (primary) hypertension: Secondary | ICD-10-CM

## 2016-06-29 DIAGNOSIS — R011 Cardiac murmur, unspecified: Secondary | ICD-10-CM | POA: Diagnosis not present

## 2016-06-29 DIAGNOSIS — E785 Hyperlipidemia, unspecified: Secondary | ICD-10-CM

## 2016-06-29 DIAGNOSIS — R001 Bradycardia, unspecified: Secondary | ICD-10-CM

## 2016-06-29 NOTE — Patient Instructions (Addendum)
Medication Instructions:  Your physician recommends that you continue on your current medications as directed. Please refer to the Current Medication list given to you today.  Labwork: None Ordered  Testing/Procedures: None Ordered  Follow-Up: Your physician wants you to follow-up in: 4112 Month with Dr Rennis GoldenHilty. You will receive a reminder letter in the mail two months in advance. If you don't receive a letter, please call our office to schedule the follow-up appointment.  Any Other Special Instructions Will Be Listed Below (If Applicable).  Wear during the day on left leg daily   If you need a refill on your cardiac medications before your next appointment, please call your pharmacy.

## 2016-06-29 NOTE — Progress Notes (Signed)
OFFICE NOTE  Chief Complaint:  Routine followup  Primary Care Physician: Geraldo Pitter, MD  HPI:  Raymond Burton is a 75 year old gentleman with a history of hypertension, dyslipidemia, and sinus bradycardia in the past. He has done very well over the past year without any new complaints. Overall, he has no complaints of chest pain, shortness of breath, palpitations, presyncope, syncopal symptoms. He continues to be asymptomatic with his bradycardia, noting that his heart rate does go up with exercise and at rest stays very low. Continues to be active is doing work around the yard. He said a couple of tick bite in the recent past and is taking doxycycline for this. Overall he has no complaints.  Mr. Conly is without complaints today. Blood pressure is well-controlled. Heart rate is still the 40s but he is asymptomatic. This does increase with exercise. He had some questions about his Cialis medicine, specifically he read about a side effect of blindness. I'm not aware of the side effect and I think it must be very infrequent. I think it is safe for him to continue to take this which should help with both prostate enlargement as well as erectile dysfunction.  06/29/2016  Mr. Hillock returns today for follow-up. Overall he is done pretty well. Heart rate remains in the upper 40s but he is asymptomatic with this. He is able to exercise without any difficulties. Blood pressure is well-controlled. His cholesterol was recently assessed and we will obtain that information from his primary care provider's office. He reported that he had an injury to his left lower leg in May. Since then he's had some swelling. He's had x-rays without any clear sign of fracture. He had venous ultrasound in the emergency department in April 2017 which demonstrated no DVT however there was hematoma the distal left thigh. I suspect he has poor venous return or damaged lymphatics leading to his ongoing edema.  PMHx:    Past Medical History:  Diagnosis Date  . Bradycardia   . Diabetes mellitus without complication (HCC)   . Hyperlipidemia   . Hypertension    NUCLEAR STRESS TEST, 07/17/2007 - EKG negative ischemia  . Murmur    2D ECHO, 02/04/2009 - EF 55-65%, normal    No past surgical history on file.  FAMHx:  Family History  Problem Relation Age of Onset  . Heart attack Mother   . Stroke Mother     SOCHx:   reports that he has never smoked. He has never used smokeless tobacco. He reports that he does not drink alcohol or use drugs.  ALLERGIES:  No Known Allergies  ROS: A comprehensive review of systems was negative.  HOME MEDS: Current Outpatient Prescriptions  Medication Sig Dispense Refill  . amLODipine (NORVASC) 10 MG tablet Take 10 mg by mouth daily.    Marland Kitchen aspirin EC 81 MG tablet Take 81 mg by mouth daily.    Marland Kitchen doxazosin (CARDURA) 2 MG tablet Take 2 mg by mouth daily.    . Garlic (GARLIQUE PO) Take by mouth daily.    . Multiple Minerals-Vitamins (CALCIUM-MAGNESIUM-ZINC-D3) TABS Take by mouth daily.    . pravastatin (PRAVACHOL) 40 MG tablet TAKE 1 TABLET BY MOUTH ONCE DAILY. 90 tablet 3  . tadalafil (CIALIS) 5 MG tablet Take 5 mg by mouth daily as needed for erectile dysfunction.    Marland Kitchen telmisartan-hydrochlorothiazide (MICARDIS HCT) 80-25 MG per tablet Take 1 tablet by mouth daily.     No current facility-administered medications for this visit.  LABS/IMAGING: No results found for this or any previous visit (from the past 48 hour(s)). No results found.  VITALS: BP 124/72   Pulse (!) 49   Ht 5\' 11"  (1.803 m)   Wt 187 lb 9.6 oz (85.1 kg)   BMI 26.16 kg/m   EXAM: General appearance: alert and no distress Neck: no carotid bruit and no JVD Lungs: clear to auscultation bilaterally Heart: regular rate and rhythm, S1, S2 normal and systolic murmur: early systolic 2/6, blowing at apex Abdomen: soft, non-tender; bowel sounds normal; no masses,  no organomegaly Extremities:  edema 1+ LLE edema Pulses: 2+ and symmetric Skin: Skin color, texture, turgor normal. No rashes or lesions Neurologic: Grossly normal  EKG: Sinus bradycardia at 49  ASSESSMENT: 1. Left leg post-traumatic edema 2. Asymptomatic sinus bradycardia with normal intervals and axes 3. Dyslipidemia-controlled 4. HTN - controlled 5. Murmur- mild MR, stable 6. ED/BPH  PLAN: 1.   [name] is well and denies any problems associated with his bradycardia. His mitral murmur is stable. We'll obtain lab work from his primary regarding cholesterol. Blood pressure is at goal. He recently had some leg trauma in the early spring however ultrasound do not demonstrate DVT. He's had persistent edema associated with this. This is likely due to venous injury or lymphatic obstruction. He would benefit from a compression stocking. Will provide those today. Otherwise follow-up with me annually or sooner as necessary.  Chrystie NoseKenneth C. Unique Searfoss, MD, [facility]ona[facility]ACC Attending Cardiologist [facility]  Chrystie NoseKenneth C Brentt Fread 06/29/2016, 8:59 AM

## 2016-07-17 IMAGING — DX DG TIBIA/FIBULA 2V*L*
4 series · 4 of 4 positions shown · non-contrast
Comparison: None.

CLINICAL DATA: Pain and swelling

EXAM:
LEFT TIBIA AND FIBULA - 2 VIEW

[tibia ap (1 of 2)]
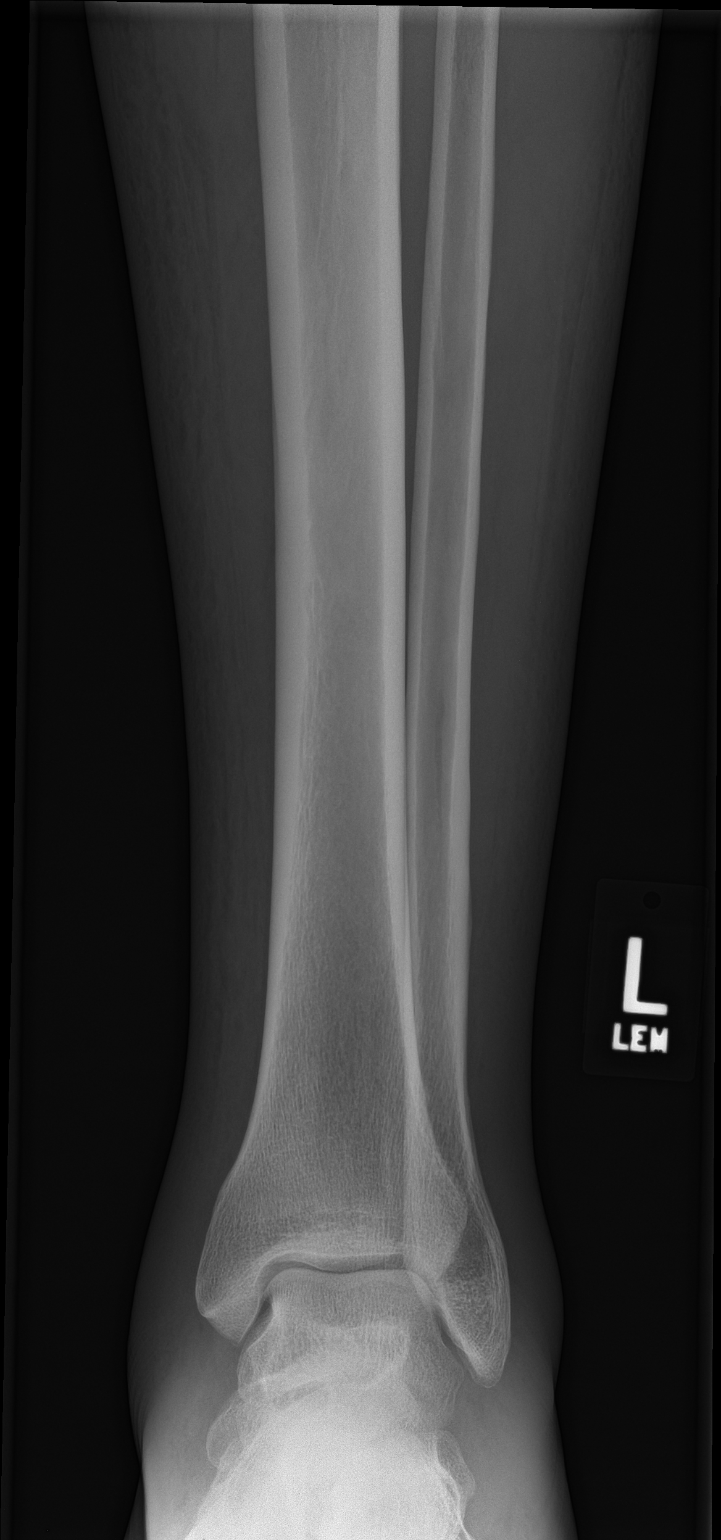

[tibia ap (2 of 2)]
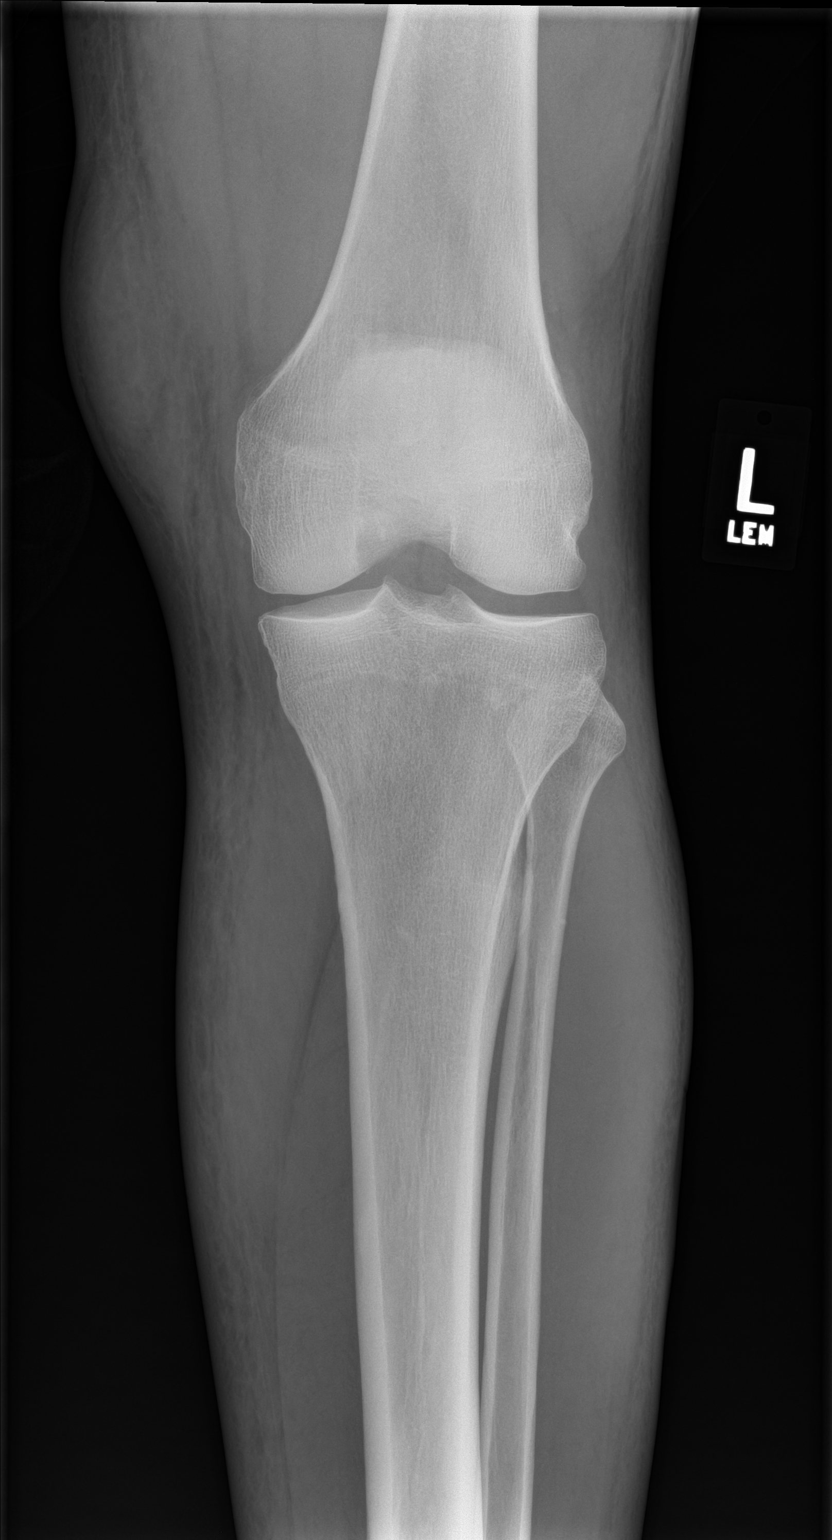

[tibia lat (1 of 2)]
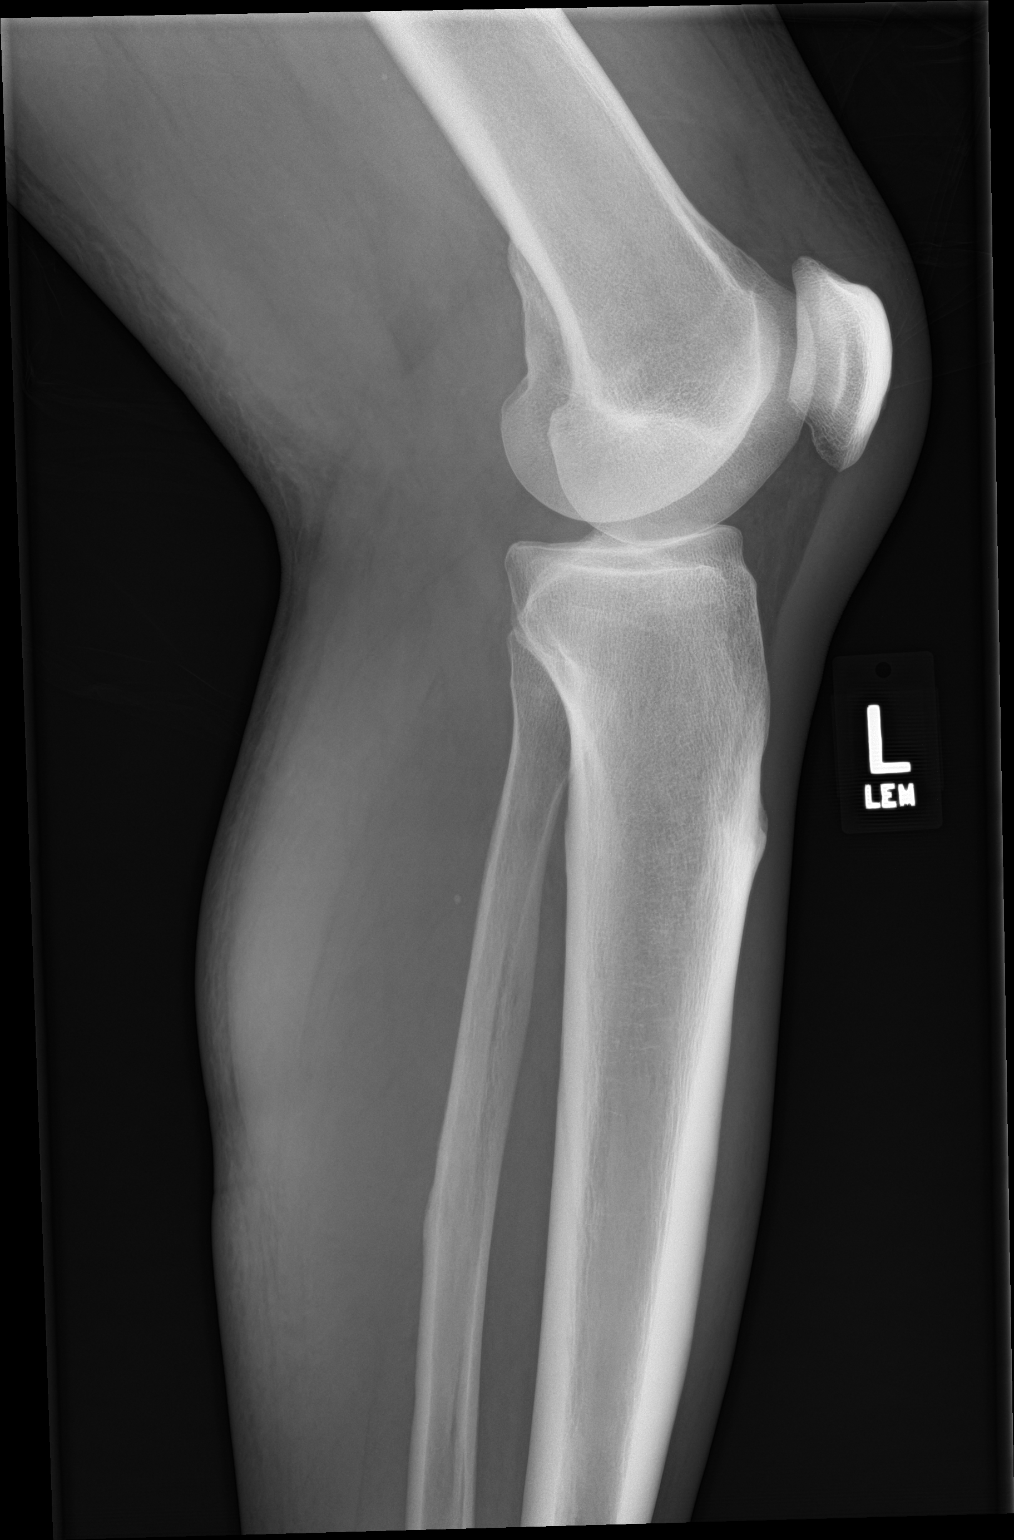

[tibia lat (2 of 2)]
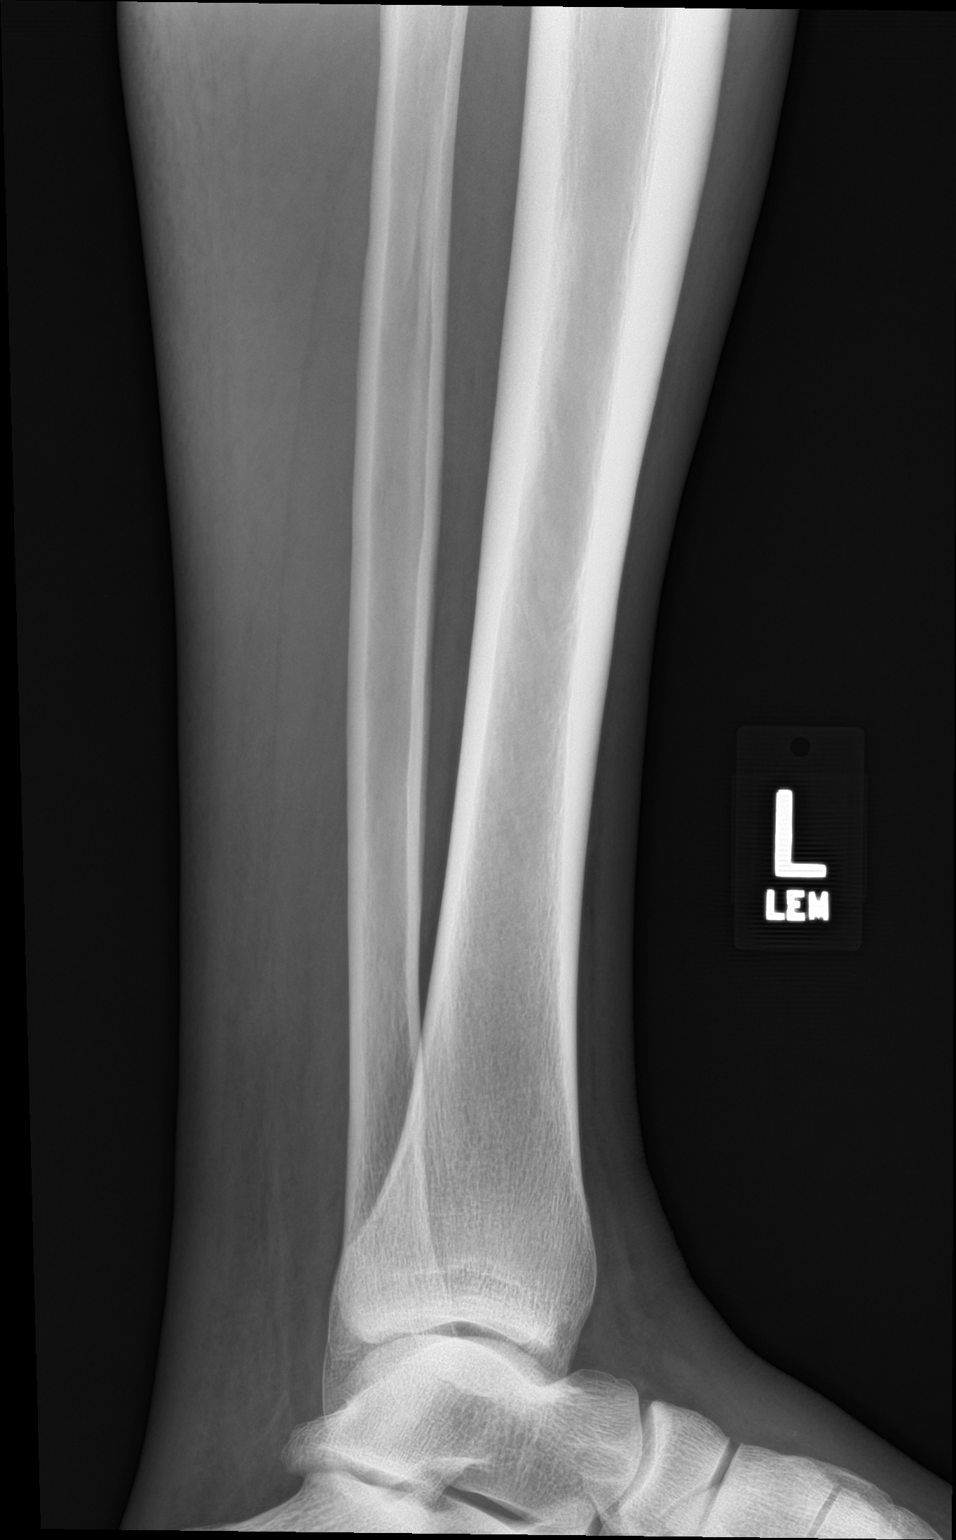

[4 of 4 positions shown; findings below may reference images not displayed]

FINDINGS: No acute fracture. No dislocation. Soft tissue swelling is noted in
the medial distal thigh.
IMPRESSION: No acute bony pathology.

## 2016-12-02 ENCOUNTER — Other Ambulatory Visit: Payer: Self-pay | Admitting: Internal Medicine

## 2016-12-02 NOTE — Telephone Encounter (Signed)
Rx(s) sent to pharmacy electronically.  

## 2017-03-07 ENCOUNTER — Other Ambulatory Visit: Payer: Self-pay | Admitting: Internal Medicine

## 2017-03-31 ENCOUNTER — Other Ambulatory Visit (HOSPITAL_COMMUNITY): Payer: Self-pay | Admitting: Family Medicine

## 2017-03-31 ENCOUNTER — Ambulatory Visit (HOSPITAL_COMMUNITY)
Admission: RE | Admit: 2017-03-31 | Discharge: 2017-03-31 | Disposition: A | Discharge status: Home / Self Care | Payer: Medicare Other | Source: Ambulatory Visit | Attending: Family Medicine | Admitting: Family Medicine

## 2017-03-31 DIAGNOSIS — M19012 Primary osteoarthritis, left shoulder: Secondary | ICD-10-CM | POA: Diagnosis not present

## 2017-03-31 DIAGNOSIS — S42002A Fracture of unspecified part of left clavicle, initial encounter for closed fracture: Secondary | ICD-10-CM | POA: Diagnosis present

## 2017-05-05 ENCOUNTER — Encounter: Payer: Medicare Other | Attending: Psychology | Admitting: Psychology

## 2017-05-05 ENCOUNTER — Encounter: Payer: Self-pay | Admitting: Psychology

## 2017-05-05 DIAGNOSIS — R413 Other amnesia: Secondary | ICD-10-CM | POA: Diagnosis not present

## 2017-05-05 NOTE — Progress Notes (Addendum)
Neuropsychological Consultation   Patient:   Raymond HolterJames L Burton   DOB:   Jun 09, 1941  MR Number:  [id number]  [facility]  [facility] [address], [place] [id number] [place] [id number] Dept: [phone]           Date of Service:   05/04/2017  Start Time:   10 AM End Time:   11 AM  Provider/Observer:  Arley PhenixJohn Joanette Silveria, Psy.D.       Clinical Neuropsychologist       Billing Code/Service: Neurobehavioral status examination   Chief Complaint:    The patient was referred by Dr. Parke SimmersBland for neuropsychological evaluation due to concerns about memory loss. The patient was concerned about whether or not these changes in memory function and word finding ability or sign of early stages of Alzheimer's versus normal age changes versus other neuropsychological/neuropsychiatric changes.  Reason for Service:  Raymond Burton is a 76 year old African-American male who was referred by Dr. Parke SimmersBland due to concerns about progressive changes in memory function. The patient reports that over the past year to that he has started forgetting more information experiencing episodes of getting lost in conversations. He also describes some word finding difficulties as well. The patient denies any geographic disorientation. The patient's wife reports that she has felt that some of these changes may be due to sleep disturbance as he is extremely restless in his sleep is always verbalizing like he is talking or making crying like sounds when he is sleeping. She reports that these symptoms started over a year ago and that as she saw these develophe he has become more forgetful. Both the patient and the wife both describe these cognitive changes as slow steady changes and not indicative of stepwise changes. The patient does not have any major medical issues that could potentially be related to this. He does acknowledge some  increased worry but to a mild degree. The patient's wife reports that there've been a lot of deaths in his immediate family and he is now the last of the siblings and he had to brothers die within the past year or 2.  The patient was referred for a sleep study with Dr. Maple HudsonYoung.  Mild obstructive sleep apnea was dx and the patient was to return for CPAP titration study.  The patient did not follow-up with this and is not using a CPAP now.  Current Status:  The patient describes significant changes in memory function as well as word finding difficulties and having difficulty finding his words during conversation. He describes being very concerning for him.  Reliability of Information: Information was provided through one hour face-to-face clinical interview with the patient and his wife as well as a review of available medical records.  Behavioral Observation: Raymond Burton  presents as a 76 y.o.-year-old Right African American Male who appeared his stated age. his dress was Appropriate and he was Well Groomed and his manners were Appropriate to the situation.  his participation was indicative of Appropriate and Attentive behaviors.  There were not any physical disabilities noted.  he displayed an appropriate level of cooperation and motivation.     Interactions:    Active Appropriate and Attentive  Attention:   within normal limits and attention span and concentration were age appropriate  Memory:   abnormal; remote memory intact, recent memory impaired  Visuo-spatial:  within normal limits  Speech (Volume):  low  Speech:   normal;  normal  Thought Process:  Coherent and Relevant  Though Content:  WNL;   Orientation:   person, place, time/date and situation  Judgment:   Good  Planning:   Good  Affect:    Anxious  Mood:    Anxious  Insight:   Good  Intelligence:   normal  Marital Status/Living: [place] and [place]  The patient is married and continues to live with his wife as well as his son Oretha CapriceDarian.  The patient has 2 children including a 75 year old son and a 75 year old son.  Current Employment: The patient is currently retired.  Past Employment:  The patient worked his entire adult life. He was in the [employer], work for the [employer] division of presence, as well as working for [employer].  Substance Use:  No concerns of substance abuse are reported.  No indications of any history of substance abuse.  Education:   The patient graduated from high school and had 2 years of formal community college.  Medical History:   Past Medical History:  Diagnosis Date  . Bradycardia   . Diabetes mellitus without complication (HCC)   . Hyperlipidemia   . Hypertension    NUCLEAR STRESS TEST, 07/17/2007 - EKG negative ischemia  . Murmur    2D ECHO, 02/04/2009 - EF 55-65%, normal       Abuse/Trauma History: There are no indications of history of abuse or trauma.  Psychiatric History:  No prior psychiatric history.  Family Med/Psych History:  Family History  Problem Relation Age of Onset  . Heart attack Mother   . Stroke Mother     Risk of Suicide/Violence: virtually non-existent no indications of any suicidal or homicidal ideation.  Impression/DX:  Raymond Burton is a 76 year old male who was referred for neuropsychological evaluation due to to concerns about progressive steady patterns of memory changes in memory loss. There also some mild features of word finding difficulties and some mild anxiety/worry. The symptoms are reported by both the patient and his wife is been progressively changing over the past year or 2. The patient has had some significant deaths in his family including his closest siblings.  The patient was referred for a sleep study with Dr. Maple HudsonYoung.  Mild obstructive sleep apnea was dx and the patient was to return for  CPAP titration study.  The patient did not follow-up with this and is not using a CPAP now.   Disposition/Plan:  We will set the patient up for neuropsychological assessment primarily using the RBANS and once that assessment battery is completed we may add or not further neuropsychological testing.  Diagnosis:    Memory loss of unknown cause         Electronically Signed   _______________________ Arley PhenixJohn Mikenna Bunkley, Psy.D.

## 2017-06-06 ENCOUNTER — Other Ambulatory Visit: Payer: Self-pay | Admitting: Internal Medicine

## 2017-06-06 ENCOUNTER — Encounter: Payer: Medicare Other | Attending: Psychology | Admitting: Psychology

## 2017-06-06 DIAGNOSIS — R413 Other amnesia: Secondary | ICD-10-CM | POA: Diagnosis present

## 2017-06-08 NOTE — Progress Notes (Signed)
The patient completed the RBANS testing battery and we will be scoring in interpreting it on 06/09/2017. The patient put his full effort into this evaluation and does appear to be a valid assessment.

## 2017-06-09 ENCOUNTER — Encounter (HOSPITAL_BASED_OUTPATIENT_CLINIC_OR_DEPARTMENT_OTHER): Payer: Medicare Other | Admitting: Psychology

## 2017-06-09 ENCOUNTER — Encounter: Payer: Self-pay | Admitting: Psychology

## 2017-06-09 DIAGNOSIS — R413 Other amnesia: Secondary | ICD-10-CM | POA: Diagnosis not present

## 2017-06-09 DIAGNOSIS — G4733 Obstructive sleep apnea (adult) (pediatric): Secondary | ICD-10-CM | POA: Diagnosis not present

## 2017-06-09 NOTE — Progress Notes (Addendum)
Neuropsychological consultation  Patient:  Raymond Burton   DOB: [birth year]  MR Number: [id number]  [facility] [address], [place] [address] [id number] [place] [id number] Dept: [phone]  Start: 10:30 AM End: 11:30 AM  Provider/Observer:     Hershal CoriaJohn R Rodenbough PSYD  Chief Complaint:      Chief Complaint  Patient presents with  . Memory Loss  . Sleeping Problem    Reason For Service:     Raymond HolterJames L Burton is a 76 year old African-American male who was referred by Dr. Parke SimmersBland due to concerns about progressive changes in memory function. The patient reports that over the past year to that he has started forgetting more information experiencing episodes of getting lost in conversations. He also describes some word finding difficulties as well. The patient denies any geographic disorientation. The patient's wife reports that she has felt that some of these changes may be due to sleep disturbance as he is extremely restless in his sleep is always verbalizing like he is talking or making crying like sounds when he is sleeping. She reports that these symptoms started over a year ago and that as she saw these develophe he has become more forgetful. Both the patient and the wife both describe these cognitive changes as slow steady changes and not indicative of stepwise changes. The patient does not have any major medical issues that could potentially be related to this. He does acknowledge some increased worry but to a mild degree. The patient's wife reports that there've been a lot of deaths in his immediate family and he is now the last of the siblings and he had to brothers die within the past year or 2.  The patient was referred for a sleep study with Dr. Maple HudsonYoung.  Mild obstructive sleep apnea was dx and the patient was to return for CPAP titration study.  The patient did not follow-up with  this and is not using a CPAP now.  Testing Administered:  RBANS  Participation Level:   Active  Participation Quality:  Appropriate      Behavioral Observation:  Well Groomed, Alert, and Appropriate.   Test Results:        RBANS Update Form A Total Scale 83  The patient produced a composite score of all indices within this neuropsychological battery of 83, which falls that the low average range of functioning.  This likely represents a significant decline from old be predicted based on his prior education and occupational history.  There was considerable variability in subtest performance and therefore the specific pattern of subtest performance will be specifically important rather than looking at this global measure beyond the concept that there is strong suggestion of general cognitive impairment.      RBANS Update Form A Immediate Memory 78  The patient produced a immediate memory index score of 78 which falls in the borderline range of functioning. This measure assess initial encoding and learning of complex and simple verbal information. This level of performance is indicative of impairments with regard to verbal memory including difficulty to encode and store new information. The patient had greater difficulties with story memory then list learning and memory and therefore the sequence of events may be one of the areas that he has greater difficulty in recalling regarding his day-to-day life.  This would likely suggest that the patient may have more difficulty remembering his medication, remembering appointments, or the sequencing of  patterns such as driving or cooking.        RBANS Update Form A Visuospatial/ Constructional 96  The patient produced an index score of 96 on the visuospatial/constructional composite score.  This measure assess basic visual spatial perception and ability to attend to visual information. The patient performed consistent with expected levels of performance  on this measure and there were no indications of significant visual spatial or constructional deficits.        RBANS Update Form A Attention 85  The patient produced and attentional index score of 85 which falls in the low average range of functioning. This is a better performance than he produced for immediate memory index score and suggest that his memory difficulties are not simply due to deficits with encoding but more of story information even though there are some issues with encoding auditory information. There is some slowing of overall visual scanning and processing speed and some issues with encoding abilities and focus execute tasks.      RBANS Update Form A Language 88  The patient produced a language index score of 88. This measure assess his expressive language functioning and this score suggests that there is some reduction in verbal fluency and verbal naming abilities. There were no indications of receptive language deficits.     RBANS Update Form A Delayed Memory 93  The patient produced a delayed memory index score of 93. This performance falls in the average range and is significantly higher than his immediate memory index score. This pattern of performance strongly suggest that the patient, while having difficulty storing initial/new learning information if that information is actually learned initially it is well maintained and available for retrieval at a later date. This is a significant finding and suggest difficulties with initial storage but the patient is able to maintain that information over a period of delay. The patient was clearly able to recognize and retrieve information from long-term memory stores.  Summary of Results:   The results of the current objective neuropsychological assessment do show considerable variability across various neuropsychological functioning areas. The patient produced a global index score of 83 which falls in the low average range. However,  there was significant variability across subtest and index measures. The patient had the greatest difficulty on measures related to immediate memory while in fact one of his best areas of performance had to do with his ability to retain information over a period of delay. The patient also did very well on visual spatial and constructional abilities. He did have some mild to moderate deficits with regard to verbal fluency and speed of mental operations/focus execute attention.   Impression/Diagnosis:   The results of the current neuropsychological evaluation are not consistent with those typically seen with various neurodegenerative/cortical dementia such as Alzheimer's. However, there is indication of significant neurocognitive decline. The areas that are most affected have to do with the overall speed of thinking and speed of mental operations, auditory encoding abilities and verbal fluency/expressive language fluency. The patient also showed significant deficits with regard to it initially learning new information. However, in sharp contrast to this, the patient was able to effectively retain the information that was initially stored quite well over a period of delay. In fact, his ability to retain information once it is learned was quite good. This would suggest more of attentional/distraction issues rather than progressive cortical dementia such as Alzheimer's or Lewy body disease. Looking to the patient's medical history I do see that he was referred  for a sleep study with Dr. Fannie Knee. The interpretation of his sleep study suggested mild sleep apnea one year ago. The patient also had an MRI of the brain 13 years ago which suggested some arterial calcification. These 2 variables may be playing a primary role in his current condition. It is my understanding that the patient did not follow-up with this initial diagnosis and has not been utilizing his CPAP device. He did not go through the titration  evaluation for specific device.  I do not think that the patient's current neuropsychological profile is consistent with conditions such as Alzheimer's or other cortical dementia is. However, we will need to reassess the patient in approximately 6-9 months to see if there is any progressive decline. In the meantime I do think that the patient should return to Dr. Maple Hudson to complete the process regarding these sleep apnea study and starting to actively utilizes CPAP device. There is specific suggestion that some of the deficits he is starting to see progressively worse and may be directly related to chronic sleep deprivation and the effects of chronic untreated sleep apnea.  Recommendations:   I would recommend two specific things with the patient at this time. The first one is following up with his sleep study to get titrated and fitted for a CPAP device. I think this is very important that the patient follow through with this as chronic sleep deprivation and sleep apneas may be playing a significant role in his observed cognitive difficulties. I also think we may want to have another CT/MRI to see if there is any worsening in the arterial calcifications identified 13 years ago.  Diagnosis:    Axis I: Memory loss of unknown cause  Obstructive sleep apnea   Arley Phenix, Psy.D. Neuropsychologist

## 2017-06-22 ENCOUNTER — Encounter: Payer: Medicare Other | Attending: Psychology | Admitting: Psychology

## 2017-06-22 DIAGNOSIS — G4733 Obstructive sleep apnea (adult) (pediatric): Secondary | ICD-10-CM

## 2017-06-22 DIAGNOSIS — R413 Other amnesia: Secondary | ICD-10-CM | POA: Insufficient documentation

## 2017-06-26 ENCOUNTER — Other Ambulatory Visit (HOSPITAL_BASED_OUTPATIENT_CLINIC_OR_DEPARTMENT_OTHER): Payer: Self-pay

## 2017-06-26 DIAGNOSIS — G478 Other sleep disorders: Secondary | ICD-10-CM

## 2017-07-03 ENCOUNTER — Ambulatory Visit (INDEPENDENT_AMBULATORY_CARE_PROVIDER_SITE_OTHER): Payer: Medicare Other | Admitting: Internal Medicine

## 2017-07-03 ENCOUNTER — Encounter: Payer: Self-pay | Admitting: Internal Medicine

## 2017-07-03 VITALS — BP 138/73 | HR 54 | Ht 71.5 in | Wt 181.4 lb

## 2017-07-03 DIAGNOSIS — R011 Cardiac murmur, unspecified: Secondary | ICD-10-CM

## 2017-07-03 DIAGNOSIS — R001 Bradycardia, unspecified: Secondary | ICD-10-CM

## 2017-07-03 DIAGNOSIS — I10 Essential (primary) hypertension: Secondary | ICD-10-CM | POA: Diagnosis not present

## 2017-07-03 DIAGNOSIS — E785 Hyperlipidemia, unspecified: Secondary | ICD-10-CM

## 2017-07-03 NOTE — Patient Instructions (Signed)
Your physician recommends that you continue on your current medications as directed. Please refer to the Current Medication list given to you today.  Your physician wants you to follow-up in: ONE YEAR with Dr. Hilty. You will receive a reminder letter in the mail two months in advance. If you don't receive a letter, please call our office to schedule the follow-up appointment.  

## 2017-07-03 NOTE — Progress Notes (Signed)
OFFICE NOTE  Chief Complaint:  Routine followup  Primary Care Physician: Renaye Rakers, MD  HPI:  Raymond Burton is a 76 year old gentleman with a history of hypertension, dyslipidemia, and sinus bradycardia in the past. He has done very well over the past year without any new complaints. Overall, he has no complaints of chest pain, shortness of breath, palpitations, presyncope, syncopal symptoms. He continues to be asymptomatic with his bradycardia, noting that his heart rate does go up with exercise and at rest stays very low. Continues to be active is doing work around the yard. He said a couple of tick bite in the recent past and is taking doxycycline for this. Overall he has no complaints.  Raymond Burton is without complaints today. Blood pressure is well-controlled. Heart rate is still the 40s but he is asymptomatic. This does increase with exercise. He had some questions about his Cialis medicine, specifically he read about a side effect of blindness. I'm not aware of the side effect and I think it must be very infrequent. I think it is safe for him to continue to take this which should help with both prostate enlargement as well as erectile dysfunction.  06/29/2016  Raymond Burton returns today for follow-up. Overall he is done pretty well. Heart rate remains in the upper 40s but he is asymptomatic with this. He is able to exercise without any difficulties. Blood pressure is well-controlled. His cholesterol was recently assessed and we will obtain that information from his primary care provider's office. He reported that he had an injury to his left lower leg in May. Since then he's had some swelling. He's had x-rays without any clear sign of fracture. He had venous ultrasound in the emergency department in April 2017 which demonstrated no DVT however there was hematoma the distal left thigh. I suspect he has poor venous return or damaged lymphatics leading to his ongoing  edema.  07/03/2017  Raymond Burton was seen today for follow-up. He denies any chest pain, worsening fatigue or shortness of breath. Heart rate is actually little higher than it has been in the past. Cholesterol is been at goal blood pressure is well-controlled today. Recently was scheduled for a split-night sleep study which is coming up next week. He has reported some fatigue during the day.  PMHx:  Past Medical History:  Diagnosis Date  . Bradycardia   . Diabetes mellitus without complication (HCC)   . Hyperlipidemia   . Hypertension    NUCLEAR STRESS TEST, 07/17/2007 - EKG negative ischemia  . Murmur    2D ECHO, 02/04/2009 - EF 55-65%, normal    No past surgical history on file.  FAMHx:  Family History  Problem Relation Age of Onset  . Heart attack Mother   . Stroke Mother     SOCHx:   reports that he has never smoked. He has never used smokeless tobacco. He reports that he does not drink alcohol or use drugs.  ALLERGIES:  No Known Allergies  ROS: Pertinent items noted in HPI and remainder of comprehensive ROS otherwise negative.  HOME MEDS: Current Outpatient Prescriptions  Medication Sig Dispense Refill  . amLODipine (NORVASC) 10 MG tablet Take 10 mg by mouth daily.    Marland Kitchen aspirin EC 81 MG tablet Take 81 mg by mouth daily.    Marland Kitchen doxazosin (CARDURA) 2 MG tablet Take 2 mg by mouth daily.    . Garlic (GARLIQUE PO) Take by mouth daily.    . Multiple Minerals-Vitamins (CALCIUM-MAGNESIUM-ZINC-D3) TABS  Take by mouth daily.    . pravastatin (PRAVACHOL) 40 MG tablet Take 1 tablet (40 mg total) by mouth daily. 90 tablet 0  . tadalafil (CIALIS) 5 MG tablet Take 5 mg by mouth daily as needed for erectile dysfunction.    Marland Kitchen telmisartan-hydrochlorothiazide (MICARDIS HCT) 80-25 MG per tablet Take 1 tablet by mouth daily.     No current facility-administered medications for this visit.     LABS/IMAGING: No results found for this or any previous visit (from the past 48 hour(s)). No  results found.  VITALS: BP 138/73   Pulse (!) 54   Ht 5' 11.5" (1.816 m)   Wt 181 lb 6.4 oz (82.3 kg)   BMI 24.95 kg/m   EXAM: General appearance: alert and no distress Neck: no carotid bruit and no JVD Lungs: clear to auscultation bilaterally Heart: regular rate and rhythm, S1, S2 normal and systolic murmur: early systolic 2/6, blowing at apex Abdomen: soft, non-tender; bowel sounds normal; no masses,  no organomegaly Extremities: edema 1+ LLE edema Pulses: 2+ and symmetric Skin: Skin color, texture, turgor normal. No rashes or lesions Neurologic: Grossly normal  EKG: Sinus bradycardia 54, LVH by voltage-personally reviewed  ASSESSMENT: 1. Left leg post-traumatic edema 2. Asymptomatic sinus bradycardia with normal intervals and axes 3. Dyslipidemia-controlled 4. HTN - controlled 5. Murmur- mild MR, stable 6. ED/BPH  PLAN: 1.   Raymond Burton seems to be doing well without any new symptoms. He has mild mitral regurgitation on echo which is been stable. His cholesterol is at goal. Blood pressure is well-controlled. He is asymptomatic with sinus bradycardia which is slightly higher today than it has been the past. Follow-up with me annually or sooner as necessary.  Chrystie Nose, MD, [facility] Attending Cardiologist [facility]  Chrystie Nose 07/03/2017, 12:57 PM

## 2017-07-04 NOTE — Progress Notes (Signed)
Impression/Diagnosis:                     The results of the current neuropsychological evaluation are not consistent with those typically seen with various neurodegenerative/cortical dementia such as Alzheimer's. However, there is indication of significant neurocognitive decline. The areas that are most affected have to do with the overall speed of thinking and speed of mental operations, auditory encoding abilities and verbal fluency/expressive language fluency. The patient also showed significant deficits with regard to it initially learning new information. However, in sharp contrast to this, the patient was able to effectively retain the information that was initially stored quite well over a period of delay. In fact, his ability to retain information once it is learned was quite good. This would suggest more of attentional/distraction issues rather than progressive cortical dementia such as Alzheimer's or Lewy body disease. Looking to the patient's medical history I do see that he was referred for a sleep study with Dr. Fannie Knee. The interpretation of his sleep study suggested mild sleep apnea one year ago. The patient also had an MRI of the brain 13 years ago which suggested some arterial calcification. These 2 variables may be playing a primary role in his current condition. It is my understanding that the patient did not follow-up with this initial diagnosis and has not been utilizing his CPAP device. He did not go through the titration evaluation for specific device.  I do not think that the patient's current neuropsychological profile is consistent with conditions such as Alzheimer's or other cortical dementia is. However, we will need to reassess the patient in approximately 6-9 months to see if there is any progressive decline. In the meantime I do think that the patient should return to Dr. Maple Hudson to complete the process regarding these sleep apnea study and starting to actively utilizes CPAP device.  There is specific suggestion that some of the deficits he is starting to see progressively worse and may be directly related to chronic sleep deprivation and the effects of chronic untreated sleep apnea.  Recommendations:                          I would recommend two specific things with the patient at this time. The first one is following up with his sleep study to get titrated and fitted for a CPAP device. I think this is very important that the patient follow through with this as chronic sleep deprivation and sleep apneas may be playing a significant role in his observed cognitive difficulties. I also think we may want to have another CT/MRI to see if there is any worsening in the arterial calcifications identified 13 years ago.  Diagnosis:                               Axis I: Memory loss of unknown cause  Obstructive sleep apnea   Arley Phenix, Psy.D. Neuropsychologist

## 2017-07-12 ENCOUNTER — Ambulatory Visit: Payer: Medicare Other | Attending: Family Medicine | Admitting: Neurology

## 2017-07-12 DIAGNOSIS — Z7982 Long term (current) use of aspirin: Secondary | ICD-10-CM | POA: Insufficient documentation

## 2017-07-12 DIAGNOSIS — G478 Other sleep disorders: Secondary | ICD-10-CM | POA: Insufficient documentation

## 2017-07-12 DIAGNOSIS — Z79899 Other long term (current) drug therapy: Secondary | ICD-10-CM | POA: Insufficient documentation

## 2017-07-12 DIAGNOSIS — R0683 Snoring: Secondary | ICD-10-CM | POA: Diagnosis not present

## 2017-07-14 ENCOUNTER — Encounter: Payer: Medicare Other | Admitting: Psychology

## 2017-07-15 NOTE — Procedures (Signed)
   [facility] Olufemi Mofield A. Gerilyn Pilgrim, MD     [email or web address]             NOCTURNAL POLYSOMNOGRAPHY   LOCATION: [facility]  Patient Name: Raymond Burton, Raymond Burton Date: 07/12/2017 Gender: Male D.O.B: Mar 10, 1941 Age (years): 75 Referring Provider: Renaye Rakers Height (inches): 71 Interpreting Physician: Beryle Beams MD, ABSM Weight (lbs): 190 RPSGT: Peak, Robert BMI: 26 MRN: [id number] Neck Size: 15.50 CLINICAL INFORMATION Sleep Study Type: NPSG  Indication for sleep study: N/A  Epworth Sleepiness Score: 15   Most recent polysomnogram dated 12/02/2015 revealed an AHI of 10.3/h and RDI of 14.2/h. SLEEP STUDY TECHNIQUE As per the AASM Manual for the Scoring of Sleep and Associated Events v2.3 (April 2016) with a hypopnea requiring 4% desaturations.  The channels recorded and monitored were frontal, central and occipital EEG, electrooculogram (EOG), submentalis EMG (chin), nasal and oral airflow, thoracic and abdominal wall motion, anterior tibialis EMG, snore microphone, electrocardiogram, and pulse oximetry.  MEDICATIONS Medications self-administered by patient taken the night of the study : AMLODIPINE, DOXAZOSIN MESYLATE, PRAVASTATIN, telmisartan-hctz   Current Outpatient Prescriptions:  .  amLODipine (NORVASC) 10 MG tablet, Take 10 mg by mouth daily., Disp: , Rfl:  .  aspirin EC 81 MG tablet, Take 81 mg by mouth daily., Disp: , Rfl:  .  doxazosin (CARDURA) 2 MG tablet, Take 2 mg by mouth daily., Disp: , Rfl:  .  Garlic (GARLIQUE PO), Take by mouth daily., Disp: , Rfl:  .  Multiple Minerals-Vitamins (CALCIUM-MAGNESIUM-ZINC-D3) TABS, Take by mouth daily., Disp: , Rfl:  .  pravastatin (PRAVACHOL) 40 MG tablet, Take 1 tablet (40 mg total) by mouth daily., Disp: 90 tablet, Rfl: 0 .  tadalafil (CIALIS) 5 MG tablet, Take 5 mg by mouth daily as needed for erectile dysfunction., Disp: , Rfl:  .  telmisartan-hydrochlorothiazide (MICARDIS HCT) 80-25 MG per tablet, Take 1  tablet by mouth daily., Disp: , Rfl:    SLEEP ARCHITECTURE The study was initiated at 9:52:53 PM and ended at 5:27:26 AM.  Sleep onset time was 1.6 minutes and the sleep efficiency was 82.8%. The total sleep time was 376.4 minutes.  Stage REM latency was 61.0 minutes.  The patient spent 14.61% of the night in stage N1 sleep, 71.04% in stage N2 sleep, 0.00% in stage N3 and 14.34% in REM.  Alpha intrusion was absent.  Supine sleep was 80.42%.  RESPIRATORY PARAMETERS The overall apnea/hypopnea index (AHI) was 3.8 per hour. There were 1 total apneas, including 0 obstructive, 1 central and 0 mixed apneas. There were 23 hypopneas and 20 RERAs.  The AHI during Stage REM sleep was 4.4 per hour.  AHI while supine was 4.2 per hour.  The mean oxygen saturation was 91.16%. The minimum SpO2 during sleep was 86.00%.  moderate snoring was noted during this study.  CARDIAC DATA The 2 lead EKG demonstrated sinus rhythm. The mean heart rate was 47.05 beats per minute. Other EKG findings include: None. LEG MOVEMENT DATA The total PLMS were 0 with a resulting PLMS index of 0.00. Associated arousal with leg movement index was 0.0.  IMPRESSIONS 1.  Absent slow sleep is observed during this recording. Otherwise, this study is unremarkable.   Argie Ramming, MD Diplomate, American Board of Sleep Medicine.  ELECTRONICALLY SIGNED ON:  07/15/2017, 5:04 PM [facility] PH: [phone]   FX: [phone] ACCREDITED BY THE AMERICAN ACADEMY OF SLEEP MEDICINE

## 2017-09-05 ENCOUNTER — Other Ambulatory Visit: Payer: Self-pay | Admitting: Internal Medicine

## 2017-09-05 NOTE — Telephone Encounter (Signed)
REFILL 

## 2018-02-20 ENCOUNTER — Encounter: Payer: Medicare Other | Attending: Psychology | Admitting: Psychology

## 2018-02-20 DIAGNOSIS — R413 Other amnesia: Secondary | ICD-10-CM

## 2018-02-23 ENCOUNTER — Encounter: Payer: Self-pay | Admitting: Psychology

## 2018-02-23 NOTE — Addendum Note (Signed)
Addended by: Hershal Coria on: 02/23/2018 11:22 AM   Modules accepted: Level of Service

## 2018-02-23 NOTE — Progress Notes (Signed)
The patient returns for his follow-up visit regarding some of the efforts with that of been made since the neuropsychological evaluation was completed.  The patient reports that he has gone into a sleep study and they found only mild sleep apnea and did not feel it was to the level that needed to be treated.  However, the patient is continuing to have sleep issues and sleep deprivation may be at least partially a factor in his cognitive difficulties.  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  The patient reports that Dr. Parke Simmers has started him on a new medication but he was not sure what it is and I am working on trying to find out whether this is a cholinesterase-based medication.

## 2018-03-20 ENCOUNTER — Ambulatory Visit: Payer: Medicare Other | Admitting: Psychology

## 2018-04-26 ENCOUNTER — Encounter: Payer: Medicare Other | Admitting: Psychology

## 2018-05-10 ENCOUNTER — Encounter: Payer: Medicare Other | Attending: Psychology | Admitting: Psychology

## 2018-05-10 DIAGNOSIS — R413 Other amnesia: Secondary | ICD-10-CM | POA: Insufficient documentation

## 2018-05-10 NOTE — Progress Notes (Signed)
Memory issues continue and some issues coming up with words or people's names and losing track some but not really much change.  Being more careful with what he is saying to keep on track.  We will perform repeat neuropsychological testing to confirm stability of functioning.   Patient keeping track of blood glucose and it is almost always under 100.  Will set up for repeat testing.    Will repeat the RBANS

## 2018-07-20 ENCOUNTER — Encounter: Payer: Medicare Other | Attending: Psychology | Admitting: Psychology

## 2018-07-20 DIAGNOSIS — G4733 Obstructive sleep apnea (adult) (pediatric): Secondary | ICD-10-CM

## 2018-07-20 DIAGNOSIS — R413 Other amnesia: Secondary | ICD-10-CM | POA: Diagnosis not present

## 2018-07-25 ENCOUNTER — Encounter: Payer: Self-pay | Admitting: Psychology

## 2018-07-25 NOTE — Progress Notes (Signed)
We continue to work on therapeutic interventions around issues related to his memory loss.  The patient reports that his memory has not changed significantly although he does continue to have difficulties in this area.  The patient reports that his sleep has been improved.  The patient is continued to actively work on the therapeutic interventions we have been addressing including sleep hygiene issues, building better coping skills and adaptive skills around stress and working on improving social interactions.

## 2018-07-26 ENCOUNTER — Encounter (HOSPITAL_BASED_OUTPATIENT_CLINIC_OR_DEPARTMENT_OTHER): Payer: Medicare Other | Admitting: Psychology

## 2018-07-26 ENCOUNTER — Encounter

## 2018-07-26 ENCOUNTER — Encounter: Payer: Self-pay | Admitting: Psychology

## 2018-07-26 DIAGNOSIS — R413 Other amnesia: Secondary | ICD-10-CM

## 2018-07-26 DIAGNOSIS — G4733 Obstructive sleep apnea (adult) (pediatric): Secondary | ICD-10-CM

## 2018-07-26 NOTE — Progress Notes (Signed)
Neuropsychological consultation  Patient:  Raymond Burton   DOB: 1941-04-13  MR Number: [id number]  Location: [facility]IC[facility] PHYSICAL MEDICINE AND REHABILITATION [address] [place], [address] [id number] [facility] [place] [address] Dept: [phone]  Start: 2:30 PM End: 3:30 PM  Provider/Observer:     Hershal Coria PsyD  Chief Complaint:      Chief Complaint  Patient presents with  . Memory Loss    Reason For Service:     Raymond Burton is a 77 year old African-American male who was referred by Dr. Parke Burton due to concerns about progressive changes in memory function. The patient reports that over the past year to that he has started forgetting more information experiencing episodes of getting lost in conversations. He also describes some word finding difficulties as well. The patient denies any geographic disorientation. The patient's wife reports that she has felt that some of these changes may be due to sleep disturbance as he is extremely restless in his sleep is always verbalizing like he is talking or making crying like sounds when he is sleeping. She reports that these symptoms started over a year ago and that as she saw these develophe he has become more forgetful. Both the patient and the wife both describe these cognitive changes as slow steady changes and not indicative of stepwise changes. The patient does not have any major medical issues that could potentially be related to this. He does acknowledge some increased worry but to a mild degree. The patient's wife reports that there've been a lot of deaths in his immediate family and he is now the last of the siblings and he had to brothers die within the past year or 2.  This was the reason for service initially in 2018.  The patient is return for follow-up neuropsychological evaluation for direct comparisons to the previous assessment done in 2018.  The patient denies any  significant changes or worsening of his cognitive functioning over the past year.    Below you will find reference for both the previous evaluation directly compared to current measures.  The patient does report that his memory is remained very stable and he is not noted any worsening of his symptoms.  The patient was administered the form a version of the RBANS in 2018 with and was administered the form b for this assessment.  Testing Administered:  RBANS  Participation Level:   Active  Participation Quality:  Appropriate      Behavioral Observation:  Well Groomed, Alert, and Appropriate.   Test Results:        RBANS Update  Total Scale  2018: 83  2019:  92  Patient produced a composite score on the initial assessment that fell in the low average range of functioning.  The patient's current measure falls in the average range and is a 9 point improvement over his assessment in 2018.  Clearly, there does not appear to be any decrease in functioning over the past year from a global perspective and in fact the patient is doing better than the initial assessment.       RBANS Update Immediate Memory  2018:  78  2019:  87  The patient initially produced an immediate memory index score of 87 which fell in the borderline range of functioning.  The patient displayed objective findings of difficulty encoding and story new information.  However, over the past year his performance on this measure is significantly improved (9  point improvement) and he is now functioning in the upper end of the low average range.  This pattern would suggest that the patient is only having mild deficits with regard to immediate memory.         RBANS Update Visuospatial/ Constructional  2018:  96    2019:  109  The patient produced a visual spatial/visual construction index score of 96 in 2018 and his current performance on an identical measure was 109.  His performance was at the average range in 2018 and is in the upper  end of the average range today.  This again is a 13 point improvement over the initial assessment.  There do not appear to be any deficits with regard to visual spatial or visual constructional abilities.        RBANS Update Attention   2018:  85     2019:  91  Again, following a similar pattern the patient showed a six-point improvement in performance comparing 2018 functioning versus 2019 on a measure of attentional variables.  His initial performance was in the low average range and his current performance is in the lower end of the average range.  If anything, this would represent only very mild difficulties with regard to attention and concentration.       RBANS Update Language 2018:  88    2019:   88  The patient produced identical scores in both 2018 in 2019 with regard to his language index.  This pattern falls in the low average range of functioning and while would be consistent with some mild deficits with regard to verbal fluency and verbal naming abilities there is no indication of any progressive deficits in this area.     RBANS Update Delayed Memory 2018:  93      2019:  98  The patient again showed improvement in his overall functioning on measures of delayed memory.  Initially produced an index score of 93 in 2018 which is at the lower end of the average range.  The patient performed essentially in the average range on the most recent measure and displayed an 8 point improvement with previous comparisons.  There do not appear to be any significant deficits with regard to his ability to remember after period of delay and he is clearly able to recognize and retrieve information from long-term memory stores.   Summary of Results:   The results of the current neuropsychological evaluation are not consistent with any progression or further deterioration in his overall neuropsychological functioning.  In fact, on all measures but one the patient showed mild to moderate improvements in  individual subtest measures.  The patient's global performance showed improvement and he also showed improvement on areas of immediate memory, visual-spatial abilities, attention and concentration, and delayed memory functioning.  The only score that was the same from previous assessment was his language functioning related to verbal fluency and naming abilities and this score was identical to last years.  There is clearly no indication of any progressive deterioration in the patient's overall functioning.  All measures fell in the average range with the exception of immediate memory which falls in the upper end of the low average range.  Impression/Diagnosis:   The results of the current neuropsychological evaluation which also consist of direct comparisons to identical test measures performed 1 year prior are not consistent with those typically seen with various neuro degenerative/cortical dementia such as Alzheimer's.  There are no indications of any significant neurocognitive decline.  The patient does have some very mild issues with regard to verbal fluency and encoding of information but the patient is able to adequately store that information and retrieve it after period of delay.  I do think that the patient should continue to address issues related to mild to moderate sleep apnea.  I will be providing feedback regarding the results of the current neuropsychological evaluation directly to the patient later this week.  Recommendations:   At this point, other than continuing to address issues related to possible obstructive sleep apnea the patient appears to be doing quite well.  There clearly no indications of progressive degenerative dementia and in fact, the patient is doing better this year than he was last year with regard to his cognitive functioning.  If there are any significant changes noted by the patient or his treating physicians I will remain available for further neuropsychological  consultation if needed.  Diagnosis:    Axis I: Memory loss of unknown cause  Obstructive sleep apnea   Arley Phenix, Psy.D. Neuropsychologist

## 2018-08-23 ENCOUNTER — Encounter: Payer: Medicare Other | Attending: Psychology | Admitting: Psychology

## 2018-08-23 DIAGNOSIS — R413 Other amnesia: Secondary | ICD-10-CM

## 2018-08-28 ENCOUNTER — Encounter: Payer: Self-pay | Admitting: Psychology

## 2018-08-28 NOTE — Progress Notes (Signed)
Today provided feedback regarding the recent evaluation.  Below is the summer and interpretation of that report.  This was interpreted and feedback provided to patient today.  Summary of Results:                        The results of the current neuropsychological evaluation are not consistent with any progression or further deterioration in his overall neuropsychological functioning.  In fact, on all measures but one the patient showed mild to moderate improvements in individual subtest measures.  The patient's global performance showed improvement and he also showed improvement on areas of immediate memory, visual-spatial abilities, attention and concentration, and delayed memory functioning.  The only score that was the same from previous assessment was his language functioning related to verbal fluency and naming abilities and this score was identical to last years.  There is clearly no indication of any progressive deterioration in the patient's overall functioning.  All measures fell in the average range with the exception of immediate memory which falls in the upper end of the low average range.  Impression/Diagnosis:                     The results of the current neuropsychological evaluation which also consist of direct comparisons to identical test measures performed 1 year prior are not consistent with those typically seen with various neuro degenerative/cortical dementia such as Alzheimer's.  There are no indications of any significant neurocognitive decline.  The patient does have some very mild issues with regard to verbal fluency and encoding of information but the patient is able to adequately store that information and retrieve it after period of delay.  I do think that the patient should continue to address issues related to mild to moderate sleep apnea.  I will be providing feedback regarding the results of the current neuropsychological evaluation directly to the patient later this  week.  Recommendations:                          At this point, other than continuing to address issues related to possible obstructive sleep apnea the patient appears to be doing quite well.  There clearly no indications of progressive degenerative dementia and in fact, the patient is doing better this year than he was last year with regard to his cognitive functioning.  If there are any significant changes noted by the patient or his treating physicians I will remain available for further neuropsychological consultation if needed.  Diagnosis:                               Axis I: Memory loss of unknown cause  Obstructive sleep apnea   Arley PhenixJohn Seletha Zimmermann, Psy.D. Neuropsychologist

## 2018-09-20 ENCOUNTER — Other Ambulatory Visit: Payer: Self-pay | Admitting: Internal Medicine

## 2018-10-04 ENCOUNTER — Ambulatory Visit: Payer: Medicare Other | Admitting: Psychology

## 2018-10-05 ENCOUNTER — Ambulatory Visit: Payer: Medicare Other | Admitting: Psychology

## 2018-10-05 ENCOUNTER — Encounter

## 2019-04-02 ENCOUNTER — Other Ambulatory Visit: Payer: Medicare Other

## 2019-04-02 ENCOUNTER — Other Ambulatory Visit: Payer: Self-pay

## 2019-04-02 DIAGNOSIS — Z20822 Contact with and (suspected) exposure to covid-19: Secondary | ICD-10-CM

## 2019-04-06 LAB — NOVEL CORONAVIRUS, NAA: SARS-CoV-2, NAA: NOT DETECTED

## 2019-10-17 DIAGNOSIS — R7309 Other abnormal glucose: Secondary | ICD-10-CM | POA: Diagnosis not present

## 2019-10-17 DIAGNOSIS — E782 Mixed hyperlipidemia: Secondary | ICD-10-CM | POA: Diagnosis not present

## 2019-10-17 DIAGNOSIS — I10 Essential (primary) hypertension: Secondary | ICD-10-CM | POA: Diagnosis not present

## 2019-10-30 ENCOUNTER — Ambulatory Visit: Payer: Medicare PPO | Attending: Internal Medicine

## 2019-10-30 ENCOUNTER — Other Ambulatory Visit: Payer: Self-pay

## 2019-10-30 DIAGNOSIS — Z20822 Contact with and (suspected) exposure to covid-19: Secondary | ICD-10-CM | POA: Insufficient documentation

## 2019-10-31 LAB — NOVEL CORONAVIRUS, NAA: SARS-CoV-2, NAA: NOT DETECTED

## 2019-11-04 ENCOUNTER — Telehealth: Payer: Self-pay | Admitting: Family Medicine

## 2019-11-04 NOTE — Telephone Encounter (Signed)
Negative COVID results given. Patient results "NOT Detected." Caller expressed understanding. ° °

## 2019-12-11 DIAGNOSIS — E782 Mixed hyperlipidemia: Secondary | ICD-10-CM | POA: Diagnosis not present

## 2019-12-11 DIAGNOSIS — R7309 Other abnormal glucose: Secondary | ICD-10-CM | POA: Diagnosis not present

## 2019-12-11 DIAGNOSIS — I10 Essential (primary) hypertension: Secondary | ICD-10-CM | POA: Diagnosis not present

## 2019-12-19 DIAGNOSIS — I10 Essential (primary) hypertension: Secondary | ICD-10-CM | POA: Diagnosis not present

## 2019-12-19 DIAGNOSIS — E1169 Type 2 diabetes mellitus with other specified complication: Secondary | ICD-10-CM | POA: Diagnosis not present

## 2019-12-19 DIAGNOSIS — E785 Hyperlipidemia, unspecified: Secondary | ICD-10-CM | POA: Diagnosis not present

## 2020-04-20 DIAGNOSIS — R54 Age-related physical debility: Secondary | ICD-10-CM | POA: Diagnosis not present

## 2020-04-20 DIAGNOSIS — Z6829 Body mass index (BMI) 29.0-29.9, adult: Secondary | ICD-10-CM | POA: Diagnosis not present

## 2020-04-20 DIAGNOSIS — E1169 Type 2 diabetes mellitus with other specified complication: Secondary | ICD-10-CM | POA: Diagnosis not present

## 2020-04-20 DIAGNOSIS — I10 Essential (primary) hypertension: Secondary | ICD-10-CM | POA: Diagnosis not present

## 2020-04-20 DIAGNOSIS — E782 Mixed hyperlipidemia: Secondary | ICD-10-CM | POA: Diagnosis not present

## 2020-06-11 DIAGNOSIS — R351 Nocturia: Secondary | ICD-10-CM | POA: Diagnosis not present

## 2020-06-11 DIAGNOSIS — R3121 Asymptomatic microscopic hematuria: Secondary | ICD-10-CM | POA: Diagnosis not present

## 2020-06-11 DIAGNOSIS — N401 Enlarged prostate with lower urinary tract symptoms: Secondary | ICD-10-CM | POA: Diagnosis not present

## 2020-06-22 DIAGNOSIS — R410 Disorientation, unspecified: Secondary | ICD-10-CM | POA: Diagnosis not present

## 2020-06-22 DIAGNOSIS — Z23 Encounter for immunization: Secondary | ICD-10-CM | POA: Diagnosis not present

## 2020-06-22 DIAGNOSIS — R7309 Other abnormal glucose: Secondary | ICD-10-CM | POA: Diagnosis not present

## 2020-06-22 DIAGNOSIS — I10 Essential (primary) hypertension: Secondary | ICD-10-CM | POA: Diagnosis not present

## 2020-07-08 DIAGNOSIS — H6123 Impacted cerumen, bilateral: Secondary | ICD-10-CM | POA: Diagnosis not present

## 2020-09-30 DIAGNOSIS — H5203 Hypermetropia, bilateral: Secondary | ICD-10-CM | POA: Diagnosis not present

## 2020-09-30 DIAGNOSIS — Z9849 Cataract extraction status, unspecified eye: Secondary | ICD-10-CM | POA: Diagnosis not present

## 2020-09-30 DIAGNOSIS — H43393 Other vitreous opacities, bilateral: Secondary | ICD-10-CM | POA: Diagnosis not present

## 2020-09-30 DIAGNOSIS — H43813 Vitreous degeneration, bilateral: Secondary | ICD-10-CM | POA: Diagnosis not present

## 2020-09-30 DIAGNOSIS — H524 Presbyopia: Secondary | ICD-10-CM | POA: Diagnosis not present

## 2020-09-30 DIAGNOSIS — H52223 Regular astigmatism, bilateral: Secondary | ICD-10-CM | POA: Diagnosis not present

## 2020-09-30 DIAGNOSIS — H33301 Unspecified retinal break, right eye: Secondary | ICD-10-CM | POA: Diagnosis not present

## 2020-09-30 DIAGNOSIS — Z961 Presence of intraocular lens: Secondary | ICD-10-CM | POA: Diagnosis not present

## 2020-10-22 DIAGNOSIS — Z125 Encounter for screening for malignant neoplasm of prostate: Secondary | ICD-10-CM | POA: Diagnosis not present

## 2020-10-22 DIAGNOSIS — I10 Essential (primary) hypertension: Secondary | ICD-10-CM | POA: Diagnosis not present

## 2020-10-22 DIAGNOSIS — F0150 Vascular dementia without behavioral disturbance: Secondary | ICD-10-CM | POA: Diagnosis not present

## 2020-10-22 DIAGNOSIS — E1169 Type 2 diabetes mellitus with other specified complication: Secondary | ICD-10-CM | POA: Diagnosis not present

## 2020-10-22 DIAGNOSIS — R413 Other amnesia: Secondary | ICD-10-CM | POA: Diagnosis not present

## 2020-11-07 DIAGNOSIS — I10 Essential (primary) hypertension: Secondary | ICD-10-CM | POA: Diagnosis not present

## 2020-11-07 DIAGNOSIS — E785 Hyperlipidemia, unspecified: Secondary | ICD-10-CM | POA: Diagnosis not present

## 2021-01-13 DIAGNOSIS — E1169 Type 2 diabetes mellitus with other specified complication: Secondary | ICD-10-CM | POA: Diagnosis not present

## 2021-01-13 DIAGNOSIS — Z0000 Encounter for general adult medical examination without abnormal findings: Secondary | ICD-10-CM | POA: Diagnosis not present

## 2021-01-13 DIAGNOSIS — E785 Hyperlipidemia, unspecified: Secondary | ICD-10-CM | POA: Diagnosis not present

## 2021-01-13 DIAGNOSIS — I10 Essential (primary) hypertension: Secondary | ICD-10-CM | POA: Diagnosis not present

## 2021-01-28 DIAGNOSIS — Z23 Encounter for immunization: Secondary | ICD-10-CM | POA: Diagnosis not present

## 2021-02-19 DIAGNOSIS — E785 Hyperlipidemia, unspecified: Secondary | ICD-10-CM | POA: Diagnosis not present

## 2021-02-19 DIAGNOSIS — R413 Other amnesia: Secondary | ICD-10-CM | POA: Diagnosis not present

## 2021-02-19 DIAGNOSIS — I10 Essential (primary) hypertension: Secondary | ICD-10-CM | POA: Diagnosis not present

## 2021-02-19 DIAGNOSIS — E559 Vitamin D deficiency, unspecified: Secondary | ICD-10-CM | POA: Diagnosis not present

## 2021-02-19 DIAGNOSIS — E1169 Type 2 diabetes mellitus with other specified complication: Secondary | ICD-10-CM | POA: Diagnosis not present

## 2021-03-26 DIAGNOSIS — R413 Other amnesia: Secondary | ICD-10-CM | POA: Diagnosis not present

## 2021-03-26 DIAGNOSIS — Z6823 Body mass index (BMI) 23.0-23.9, adult: Secondary | ICD-10-CM | POA: Diagnosis not present

## 2021-03-26 DIAGNOSIS — I10 Essential (primary) hypertension: Secondary | ICD-10-CM | POA: Diagnosis not present

## 2021-03-26 DIAGNOSIS — E1169 Type 2 diabetes mellitus with other specified complication: Secondary | ICD-10-CM | POA: Diagnosis not present

## 2021-03-26 DIAGNOSIS — R634 Abnormal weight loss: Secondary | ICD-10-CM | POA: Diagnosis not present

## 2021-05-17 DIAGNOSIS — E559 Vitamin D deficiency, unspecified: Secondary | ICD-10-CM | POA: Diagnosis not present

## 2021-05-17 DIAGNOSIS — G4730 Sleep apnea, unspecified: Secondary | ICD-10-CM | POA: Diagnosis not present

## 2021-05-17 DIAGNOSIS — I10 Essential (primary) hypertension: Secondary | ICD-10-CM | POA: Diagnosis not present

## 2021-05-17 DIAGNOSIS — R7309 Other abnormal glucose: Secondary | ICD-10-CM | POA: Diagnosis not present

## 2021-05-17 DIAGNOSIS — E669 Obesity, unspecified: Secondary | ICD-10-CM | POA: Diagnosis not present

## 2021-05-17 DIAGNOSIS — R413 Other amnesia: Secondary | ICD-10-CM | POA: Diagnosis not present

## 2021-05-17 DIAGNOSIS — E782 Mixed hyperlipidemia: Secondary | ICD-10-CM | POA: Diagnosis not present

## 2021-06-02 DIAGNOSIS — H04121 Dry eye syndrome of right lacrimal gland: Secondary | ICD-10-CM | POA: Diagnosis not present

## 2021-06-02 DIAGNOSIS — H11021 Central pterygium of right eye: Secondary | ICD-10-CM | POA: Diagnosis not present

## 2021-06-02 DIAGNOSIS — T7840XD Allergy, unspecified, subsequent encounter: Secondary | ICD-10-CM | POA: Diagnosis not present

## 2021-06-02 DIAGNOSIS — H04122 Dry eye syndrome of left lacrimal gland: Secondary | ICD-10-CM | POA: Diagnosis not present

## 2021-07-12 DIAGNOSIS — N400 Enlarged prostate without lower urinary tract symptoms: Secondary | ICD-10-CM | POA: Diagnosis not present

## 2021-07-12 DIAGNOSIS — R3121 Asymptomatic microscopic hematuria: Secondary | ICD-10-CM | POA: Diagnosis not present

## 2021-08-17 DIAGNOSIS — E1169 Type 2 diabetes mellitus with other specified complication: Secondary | ICD-10-CM | POA: Diagnosis not present

## 2021-08-17 DIAGNOSIS — A779 Spotted fever, unspecified: Secondary | ICD-10-CM | POA: Diagnosis not present

## 2021-08-17 DIAGNOSIS — E785 Hyperlipidemia, unspecified: Secondary | ICD-10-CM | POA: Diagnosis not present

## 2021-08-17 DIAGNOSIS — I10 Essential (primary) hypertension: Secondary | ICD-10-CM | POA: Diagnosis not present

## 2021-08-17 DIAGNOSIS — E559 Vitamin D deficiency, unspecified: Secondary | ICD-10-CM | POA: Diagnosis not present

## 2021-08-17 DIAGNOSIS — R413 Other amnesia: Secondary | ICD-10-CM | POA: Diagnosis not present

## 2021-08-17 DIAGNOSIS — A849 Tick-borne viral encephalitis, unspecified: Secondary | ICD-10-CM | POA: Diagnosis not present

## 2021-08-17 DIAGNOSIS — F0150 Vascular dementia without behavioral disturbance: Secondary | ICD-10-CM | POA: Diagnosis not present

## 2021-09-13 DIAGNOSIS — R410 Disorientation, unspecified: Secondary | ICD-10-CM | POA: Diagnosis not present

## 2021-09-13 DIAGNOSIS — F0150 Vascular dementia without behavioral disturbance: Secondary | ICD-10-CM | POA: Diagnosis not present

## 2021-09-13 DIAGNOSIS — E1169 Type 2 diabetes mellitus with other specified complication: Secondary | ICD-10-CM | POA: Diagnosis not present

## 2021-09-13 DIAGNOSIS — I10 Essential (primary) hypertension: Secondary | ICD-10-CM | POA: Diagnosis not present

## 2021-10-28 DIAGNOSIS — K4090 Unilateral inguinal hernia, without obstruction or gangrene, not specified as recurrent: Secondary | ICD-10-CM | POA: Diagnosis not present

## 2021-11-03 DIAGNOSIS — Z7729 Contact with and (suspected ) exposure to other hazardous substances: Secondary | ICD-10-CM | POA: Diagnosis not present

## 2021-11-03 DIAGNOSIS — R413 Other amnesia: Secondary | ICD-10-CM | POA: Diagnosis not present

## 2021-11-03 DIAGNOSIS — Z1159 Encounter for screening for other viral diseases: Secondary | ICD-10-CM | POA: Diagnosis not present

## 2021-11-03 DIAGNOSIS — Z79899 Other long term (current) drug therapy: Secondary | ICD-10-CM | POA: Diagnosis not present

## 2021-11-03 DIAGNOSIS — G20 Parkinson's disease: Secondary | ICD-10-CM | POA: Diagnosis not present

## 2021-11-03 DIAGNOSIS — G4752 REM sleep behavior disorder: Secondary | ICD-10-CM | POA: Diagnosis not present

## 2021-11-03 DIAGNOSIS — F0390 Unspecified dementia without behavioral disturbance: Secondary | ICD-10-CM | POA: Diagnosis not present

## 2021-11-03 DIAGNOSIS — R258 Other abnormal involuntary movements: Secondary | ICD-10-CM | POA: Diagnosis not present

## 2021-11-10 DIAGNOSIS — R413 Other amnesia: Secondary | ICD-10-CM | POA: Diagnosis not present

## 2021-12-02 DIAGNOSIS — I10 Essential (primary) hypertension: Secondary | ICD-10-CM | POA: Diagnosis not present

## 2021-12-15 DIAGNOSIS — R258 Other abnormal involuntary movements: Secondary | ICD-10-CM | POA: Diagnosis not present

## 2021-12-15 DIAGNOSIS — G20 Parkinson's disease: Secondary | ICD-10-CM | POA: Diagnosis not present

## 2021-12-15 DIAGNOSIS — F0280 Dementia in other diseases classified elsewhere without behavioral disturbance: Secondary | ICD-10-CM | POA: Diagnosis not present

## 2021-12-15 DIAGNOSIS — Z79899 Other long term (current) drug therapy: Secondary | ICD-10-CM | POA: Diagnosis not present

## 2021-12-15 DIAGNOSIS — F03A0 Unspecified dementia, mild, without behavioral disturbance, psychotic disturbance, mood disturbance, and anxiety: Secondary | ICD-10-CM | POA: Diagnosis not present

## 2021-12-22 DIAGNOSIS — K4090 Unilateral inguinal hernia, without obstruction or gangrene, not specified as recurrent: Secondary | ICD-10-CM | POA: Diagnosis not present

## 2022-01-13 DIAGNOSIS — I10 Essential (primary) hypertension: Secondary | ICD-10-CM | POA: Diagnosis not present

## 2022-01-13 DIAGNOSIS — D176 Benign lipomatous neoplasm of spermatic cord: Secondary | ICD-10-CM | POA: Diagnosis not present

## 2022-01-13 DIAGNOSIS — K4090 Unilateral inguinal hernia, without obstruction or gangrene, not specified as recurrent: Secondary | ICD-10-CM | POA: Diagnosis not present

## 2022-01-31 DIAGNOSIS — I10 Essential (primary) hypertension: Secondary | ICD-10-CM | POA: Diagnosis not present

## 2022-01-31 DIAGNOSIS — E1169 Type 2 diabetes mellitus with other specified complication: Secondary | ICD-10-CM | POA: Diagnosis not present

## 2022-01-31 DIAGNOSIS — F0150 Vascular dementia without behavioral disturbance: Secondary | ICD-10-CM | POA: Diagnosis not present

## 2022-01-31 DIAGNOSIS — E782 Mixed hyperlipidemia: Secondary | ICD-10-CM | POA: Diagnosis not present

## 2022-06-10 DIAGNOSIS — Z0000 Encounter for general adult medical examination without abnormal findings: Secondary | ICD-10-CM | POA: Diagnosis not present

## 2022-06-10 DIAGNOSIS — E119 Type 2 diabetes mellitus without complications: Secondary | ICD-10-CM | POA: Diagnosis not present

## 2022-06-10 DIAGNOSIS — Z6826 Body mass index (BMI) 26.0-26.9, adult: Secondary | ICD-10-CM | POA: Diagnosis not present

## 2022-06-10 DIAGNOSIS — G4730 Sleep apnea, unspecified: Secondary | ICD-10-CM | POA: Diagnosis not present

## 2022-06-10 DIAGNOSIS — R7309 Other abnormal glucose: Secondary | ICD-10-CM | POA: Diagnosis not present

## 2022-06-10 DIAGNOSIS — I10 Essential (primary) hypertension: Secondary | ICD-10-CM | POA: Diagnosis not present

## 2022-06-10 DIAGNOSIS — F0150 Vascular dementia without behavioral disturbance: Secondary | ICD-10-CM | POA: Diagnosis not present

## 2022-06-23 DIAGNOSIS — F03A0 Unspecified dementia, mild, without behavioral disturbance, psychotic disturbance, mood disturbance, and anxiety: Secondary | ICD-10-CM | POA: Diagnosis not present

## 2022-06-23 DIAGNOSIS — R258 Other abnormal involuntary movements: Secondary | ICD-10-CM | POA: Diagnosis not present

## 2022-06-23 DIAGNOSIS — F0390 Unspecified dementia without behavioral disturbance: Secondary | ICD-10-CM | POA: Diagnosis not present

## 2022-06-23 DIAGNOSIS — Z79899 Other long term (current) drug therapy: Secondary | ICD-10-CM | POA: Diagnosis not present

## 2022-08-29 DIAGNOSIS — N401 Enlarged prostate with lower urinary tract symptoms: Secondary | ICD-10-CM | POA: Diagnosis not present

## 2022-08-29 DIAGNOSIS — R3121 Asymptomatic microscopic hematuria: Secondary | ICD-10-CM | POA: Diagnosis not present

## 2022-08-29 DIAGNOSIS — R351 Nocturia: Secondary | ICD-10-CM | POA: Diagnosis not present

## 2022-09-09 DIAGNOSIS — I10 Essential (primary) hypertension: Secondary | ICD-10-CM | POA: Diagnosis not present

## 2022-09-09 DIAGNOSIS — E782 Mixed hyperlipidemia: Secondary | ICD-10-CM | POA: Diagnosis not present

## 2022-09-09 DIAGNOSIS — E1169 Type 2 diabetes mellitus with other specified complication: Secondary | ICD-10-CM | POA: Diagnosis not present

## 2022-10-26 DIAGNOSIS — F03A0 Unspecified dementia, mild, without behavioral disturbance, psychotic disturbance, mood disturbance, and anxiety: Secondary | ICD-10-CM | POA: Diagnosis not present

## 2022-10-26 DIAGNOSIS — Z77098 Contact with and (suspected) exposure to other hazardous, chiefly nonmedicinal, chemicals: Secondary | ICD-10-CM | POA: Diagnosis not present

## 2022-10-26 DIAGNOSIS — G20A1 Parkinson's disease without dyskinesia, without mention of fluctuations: Secondary | ICD-10-CM | POA: Diagnosis not present

## 2023-01-13 DIAGNOSIS — R7309 Other abnormal glucose: Secondary | ICD-10-CM | POA: Diagnosis not present

## 2023-01-13 DIAGNOSIS — I10 Essential (primary) hypertension: Secondary | ICD-10-CM | POA: Diagnosis not present

## 2023-02-20 DIAGNOSIS — S70362A Insect bite (nonvenomous), left thigh, initial encounter: Secondary | ICD-10-CM | POA: Diagnosis not present

## 2023-04-26 DIAGNOSIS — G20A1 Parkinson's disease without dyskinesia, without mention of fluctuations: Secondary | ICD-10-CM | POA: Diagnosis not present

## 2023-04-26 DIAGNOSIS — F02A0 Dementia in other diseases classified elsewhere, mild, without behavioral disturbance, psychotic disturbance, mood disturbance, and anxiety: Secondary | ICD-10-CM | POA: Diagnosis not present

## 2023-05-19 DIAGNOSIS — I10 Essential (primary) hypertension: Secondary | ICD-10-CM | POA: Diagnosis not present

## 2023-05-19 DIAGNOSIS — Z6824 Body mass index (BMI) 24.0-24.9, adult: Secondary | ICD-10-CM | POA: Diagnosis not present

## 2023-05-19 DIAGNOSIS — E1169 Type 2 diabetes mellitus with other specified complication: Secondary | ICD-10-CM | POA: Diagnosis not present

## 2023-05-19 DIAGNOSIS — E559 Vitamin D deficiency, unspecified: Secondary | ICD-10-CM | POA: Diagnosis not present

## 2023-05-19 DIAGNOSIS — F0150 Vascular dementia without behavioral disturbance: Secondary | ICD-10-CM | POA: Diagnosis not present

## 2023-06-10 DIAGNOSIS — N1831 Chronic kidney disease, stage 3a: Secondary | ICD-10-CM | POA: Diagnosis not present

## 2023-06-10 DIAGNOSIS — Z823 Family history of stroke: Secondary | ICD-10-CM | POA: Diagnosis not present

## 2023-06-10 DIAGNOSIS — G20A1 Parkinson's disease without dyskinesia, without mention of fluctuations: Secondary | ICD-10-CM | POA: Diagnosis not present

## 2023-06-10 DIAGNOSIS — E785 Hyperlipidemia, unspecified: Secondary | ICD-10-CM | POA: Diagnosis not present

## 2023-06-10 DIAGNOSIS — Z8249 Family history of ischemic heart disease and other diseases of the circulatory system: Secondary | ICD-10-CM | POA: Diagnosis not present

## 2023-06-10 DIAGNOSIS — N400 Enlarged prostate without lower urinary tract symptoms: Secondary | ICD-10-CM | POA: Diagnosis not present

## 2023-06-10 DIAGNOSIS — G309 Alzheimer's disease, unspecified: Secondary | ICD-10-CM | POA: Diagnosis not present

## 2023-06-10 DIAGNOSIS — F0150 Vascular dementia without behavioral disturbance: Secondary | ICD-10-CM | POA: Diagnosis not present

## 2023-06-10 DIAGNOSIS — Z809 Family history of malignant neoplasm, unspecified: Secondary | ICD-10-CM | POA: Diagnosis not present

## 2023-06-19 DIAGNOSIS — Z6824 Body mass index (BMI) 24.0-24.9, adult: Secondary | ICD-10-CM | POA: Diagnosis not present

## 2023-06-19 DIAGNOSIS — A6929 Other conditions associated with Lyme disease: Secondary | ICD-10-CM | POA: Diagnosis not present

## 2023-06-19 DIAGNOSIS — A849 Tick-borne viral encephalitis, unspecified: Secondary | ICD-10-CM | POA: Diagnosis not present

## 2023-08-22 DIAGNOSIS — R4584 Anhedonia: Secondary | ICD-10-CM | POA: Diagnosis not present

## 2023-08-22 DIAGNOSIS — G5132 Clonic hemifacial spasm, left: Secondary | ICD-10-CM | POA: Diagnosis not present

## 2023-08-22 DIAGNOSIS — G20A1 Parkinson's disease without dyskinesia, without mention of fluctuations: Secondary | ICD-10-CM | POA: Diagnosis not present

## 2023-08-29 DIAGNOSIS — R4182 Altered mental status, unspecified: Secondary | ICD-10-CM | POA: Diagnosis not present

## 2023-08-29 DIAGNOSIS — Z8673 Personal history of transient ischemic attack (TIA), and cerebral infarction without residual deficits: Secondary | ICD-10-CM | POA: Diagnosis not present

## 2023-08-29 DIAGNOSIS — G5132 Clonic hemifacial spasm, left: Secondary | ICD-10-CM | POA: Diagnosis not present

## 2023-08-29 DIAGNOSIS — R9089 Other abnormal findings on diagnostic imaging of central nervous system: Secondary | ICD-10-CM | POA: Diagnosis not present

## 2023-08-29 DIAGNOSIS — I6782 Cerebral ischemia: Secondary | ICD-10-CM | POA: Diagnosis not present

## 2023-09-06 ENCOUNTER — Inpatient Hospital Stay (HOSPITAL_COMMUNITY)
Admission: EM | Admit: 2023-09-06 | Discharge: 2023-09-20 | DRG: 683 | Disposition: A | Discharge status: Other Acute Inpatient Hospital | Payer: Medicare PPO | Attending: Internal Medicine | Admitting: Internal Medicine

## 2023-09-06 ENCOUNTER — Emergency Department (HOSPITAL_COMMUNITY): Payer: Medicare PPO

## 2023-09-06 ENCOUNTER — Encounter (HOSPITAL_COMMUNITY): Payer: Self-pay

## 2023-09-06 DIAGNOSIS — N179 Acute kidney failure, unspecified: Secondary | ICD-10-CM | POA: Diagnosis not present

## 2023-09-06 DIAGNOSIS — N170 Acute kidney failure with tubular necrosis: Secondary | ICD-10-CM | POA: Diagnosis not present

## 2023-09-06 DIAGNOSIS — F0283 Dementia in other diseases classified elsewhere, unspecified severity, with mood disturbance: Secondary | ICD-10-CM | POA: Diagnosis present

## 2023-09-06 DIAGNOSIS — J690 Pneumonitis due to inhalation of food and vomit: Secondary | ICD-10-CM | POA: Diagnosis not present

## 2023-09-06 DIAGNOSIS — C9000 Multiple myeloma not having achieved remission: Secondary | ICD-10-CM | POA: Diagnosis present

## 2023-09-06 DIAGNOSIS — I1311 Hypertensive heart and chronic kidney disease without heart failure, with stage 5 chronic kidney disease, or end stage renal disease: Secondary | ICD-10-CM | POA: Diagnosis not present

## 2023-09-06 DIAGNOSIS — N186 End stage renal disease: Secondary | ICD-10-CM | POA: Diagnosis present

## 2023-09-06 DIAGNOSIS — N189 Chronic kidney disease, unspecified: Secondary | ICD-10-CM | POA: Diagnosis not present

## 2023-09-06 DIAGNOSIS — I10 Essential (primary) hypertension: Secondary | ICD-10-CM | POA: Diagnosis present

## 2023-09-06 DIAGNOSIS — N281 Cyst of kidney, acquired: Secondary | ICD-10-CM | POA: Diagnosis not present

## 2023-09-06 DIAGNOSIS — M898X9 Other specified disorders of bone, unspecified site: Secondary | ICD-10-CM | POA: Diagnosis present

## 2023-09-06 DIAGNOSIS — I6782 Cerebral ischemia: Secondary | ICD-10-CM | POA: Diagnosis not present

## 2023-09-06 DIAGNOSIS — R6521 Severe sepsis with septic shock: Secondary | ICD-10-CM | POA: Diagnosis not present

## 2023-09-06 DIAGNOSIS — R809 Proteinuria, unspecified: Secondary | ICD-10-CM | POA: Diagnosis not present

## 2023-09-06 DIAGNOSIS — K219 Gastro-esophageal reflux disease without esophagitis: Secondary | ICD-10-CM | POA: Diagnosis present

## 2023-09-06 DIAGNOSIS — N400 Enlarged prostate without lower urinary tract symptoms: Secondary | ICD-10-CM | POA: Diagnosis present

## 2023-09-06 DIAGNOSIS — B952 Enterococcus as the cause of diseases classified elsewhere: Secondary | ICD-10-CM | POA: Diagnosis not present

## 2023-09-06 DIAGNOSIS — Z1152 Encounter for screening for COVID-19: Secondary | ICD-10-CM

## 2023-09-06 DIAGNOSIS — Z7982 Long term (current) use of aspirin: Secondary | ICD-10-CM | POA: Diagnosis not present

## 2023-09-06 DIAGNOSIS — Z79899 Other long term (current) drug therapy: Secondary | ICD-10-CM

## 2023-09-06 DIAGNOSIS — Z8673 Personal history of transient ischemic attack (TIA), and cerebral infarction without residual deficits: Secondary | ICD-10-CM | POA: Diagnosis not present

## 2023-09-06 DIAGNOSIS — Z751 Person awaiting admission to adequate facility elsewhere: Secondary | ICD-10-CM

## 2023-09-06 DIAGNOSIS — I480 Paroxysmal atrial fibrillation: Secondary | ICD-10-CM | POA: Diagnosis not present

## 2023-09-06 DIAGNOSIS — E8770 Fluid overload, unspecified: Secondary | ICD-10-CM | POA: Diagnosis not present

## 2023-09-06 DIAGNOSIS — R0989 Other specified symptoms and signs involving the circulatory and respiratory systems: Secondary | ICD-10-CM | POA: Diagnosis not present

## 2023-09-06 DIAGNOSIS — R0602 Shortness of breath: Secondary | ICD-10-CM | POA: Diagnosis not present

## 2023-09-06 DIAGNOSIS — N390 Urinary tract infection, site not specified: Secondary | ICD-10-CM | POA: Diagnosis not present

## 2023-09-06 DIAGNOSIS — Z823 Family history of stroke: Secondary | ICD-10-CM

## 2023-09-06 DIAGNOSIS — I959 Hypotension, unspecified: Secondary | ICD-10-CM | POA: Diagnosis not present

## 2023-09-06 DIAGNOSIS — R7303 Prediabetes: Secondary | ICD-10-CM | POA: Diagnosis not present

## 2023-09-06 DIAGNOSIS — K5900 Constipation, unspecified: Secondary | ICD-10-CM | POA: Diagnosis not present

## 2023-09-06 DIAGNOSIS — Z8249 Family history of ischemic heart disease and other diseases of the circulatory system: Secondary | ICD-10-CM | POA: Diagnosis not present

## 2023-09-06 DIAGNOSIS — R918 Other nonspecific abnormal finding of lung field: Secondary | ICD-10-CM | POA: Diagnosis not present

## 2023-09-06 DIAGNOSIS — N320 Bladder-neck obstruction: Secondary | ICD-10-CM | POA: Diagnosis present

## 2023-09-06 DIAGNOSIS — R0902 Hypoxemia: Secondary | ICD-10-CM | POA: Diagnosis not present

## 2023-09-06 DIAGNOSIS — G20A1 Parkinson's disease without dyskinesia, without mention of fluctuations: Secondary | ICD-10-CM | POA: Diagnosis present

## 2023-09-06 DIAGNOSIS — D631 Anemia in chronic kidney disease: Secondary | ICD-10-CM | POA: Diagnosis present

## 2023-09-06 DIAGNOSIS — I2489 Other forms of acute ischemic heart disease: Secondary | ICD-10-CM | POA: Diagnosis present

## 2023-09-06 DIAGNOSIS — R001 Bradycardia, unspecified: Secondary | ICD-10-CM | POA: Diagnosis present

## 2023-09-06 DIAGNOSIS — D1803 Hemangioma of intra-abdominal structures: Secondary | ICD-10-CM | POA: Diagnosis present

## 2023-09-06 DIAGNOSIS — R531 Weakness: Secondary | ICD-10-CM | POA: Diagnosis not present

## 2023-09-06 DIAGNOSIS — J9601 Acute respiratory failure with hypoxia: Secondary | ICD-10-CM | POA: Diagnosis not present

## 2023-09-06 DIAGNOSIS — D72829 Elevated white blood cell count, unspecified: Secondary | ICD-10-CM | POA: Diagnosis not present

## 2023-09-06 DIAGNOSIS — M47819 Spondylosis without myelopathy or radiculopathy, site unspecified: Secondary | ICD-10-CM | POA: Diagnosis not present

## 2023-09-06 DIAGNOSIS — A419 Sepsis, unspecified organism: Secondary | ICD-10-CM | POA: Diagnosis not present

## 2023-09-06 DIAGNOSIS — E785 Hyperlipidemia, unspecified: Secondary | ICD-10-CM | POA: Diagnosis present

## 2023-09-06 DIAGNOSIS — I272 Pulmonary hypertension, unspecified: Secondary | ICD-10-CM | POA: Diagnosis not present

## 2023-09-06 DIAGNOSIS — I509 Heart failure, unspecified: Secondary | ICD-10-CM | POA: Diagnosis not present

## 2023-09-06 DIAGNOSIS — I132 Hypertensive heart and chronic kidney disease with heart failure and with stage 5 chronic kidney disease, or end stage renal disease: Secondary | ICD-10-CM | POA: Diagnosis not present

## 2023-09-06 DIAGNOSIS — R7989 Other specified abnormal findings of blood chemistry: Secondary | ICD-10-CM | POA: Diagnosis not present

## 2023-09-06 DIAGNOSIS — J811 Chronic pulmonary edema: Secondary | ICD-10-CM | POA: Diagnosis not present

## 2023-09-06 DIAGNOSIS — I517 Cardiomegaly: Secondary | ICD-10-CM | POA: Diagnosis not present

## 2023-09-06 DIAGNOSIS — I4891 Unspecified atrial fibrillation: Secondary | ICD-10-CM | POA: Diagnosis not present

## 2023-09-06 DIAGNOSIS — R0600 Dyspnea, unspecified: Secondary | ICD-10-CM | POA: Diagnosis not present

## 2023-09-06 DIAGNOSIS — R451 Restlessness and agitation: Secondary | ICD-10-CM | POA: Diagnosis not present

## 2023-09-06 DIAGNOSIS — J9811 Atelectasis: Secondary | ICD-10-CM | POA: Diagnosis not present

## 2023-09-06 DIAGNOSIS — R059 Cough, unspecified: Secondary | ICD-10-CM | POA: Diagnosis not present

## 2023-09-06 DIAGNOSIS — I5031 Acute diastolic (congestive) heart failure: Secondary | ICD-10-CM | POA: Diagnosis not present

## 2023-09-06 DIAGNOSIS — G9340 Encephalopathy, unspecified: Secondary | ICD-10-CM | POA: Diagnosis not present

## 2023-09-06 DIAGNOSIS — I129 Hypertensive chronic kidney disease with stage 1 through stage 4 chronic kidney disease, or unspecified chronic kidney disease: Secondary | ICD-10-CM | POA: Diagnosis not present

## 2023-09-06 DIAGNOSIS — Z452 Encounter for adjustment and management of vascular access device: Secondary | ICD-10-CM | POA: Diagnosis not present

## 2023-09-06 DIAGNOSIS — E1122 Type 2 diabetes mellitus with diabetic chronic kidney disease: Secondary | ICD-10-CM | POA: Diagnosis present

## 2023-09-06 DIAGNOSIS — G928 Other toxic encephalopathy: Secondary | ICD-10-CM | POA: Diagnosis not present

## 2023-09-06 DIAGNOSIS — M4316 Spondylolisthesis, lumbar region: Secondary | ICD-10-CM | POA: Diagnosis not present

## 2023-09-06 DIAGNOSIS — I21A1 Myocardial infarction type 2: Secondary | ICD-10-CM | POA: Diagnosis not present

## 2023-09-06 DIAGNOSIS — Z7901 Long term (current) use of anticoagulants: Secondary | ICD-10-CM | POA: Diagnosis not present

## 2023-09-06 DIAGNOSIS — N250 Renal osteodystrophy: Secondary | ICD-10-CM | POA: Diagnosis not present

## 2023-09-06 HISTORY — DX: Transient cerebral ischemic attack, unspecified: G45.9

## 2023-09-06 HISTORY — DX: Other amnesia: R41.3

## 2023-09-06 LAB — CBC
HCT: 35.8 % — ABNORMAL LOW (ref 39.0–52.0)
Hemoglobin: 11.9 g/dL — ABNORMAL LOW (ref 13.0–17.0)
MCH: 33.5 pg (ref 26.0–34.0)
MCHC: 33.2 g/dL (ref 30.0–36.0)
MCV: 100.8 fL — ABNORMAL HIGH (ref 80.0–100.0)
Platelets: 197 10*3/uL (ref 150–400)
RBC: 3.55 MIL/uL — ABNORMAL LOW (ref 4.22–5.81)
RDW: 12.9 % (ref 11.5–15.5)
WBC: 6 10*3/uL (ref 4.0–10.5)
nRBC: 0 % (ref 0.0–0.2)

## 2023-09-06 LAB — T4, FREE: Free T4: 0.88 ng/dL (ref 0.61–1.12)

## 2023-09-06 LAB — TSH: TSH: 2.05 u[IU]/mL (ref 0.350–4.500)

## 2023-09-06 LAB — RESP PANEL BY RT-PCR (RSV, FLU A&B, COVID)  RVPGX2
Influenza A by PCR: NEGATIVE
Influenza B by PCR: NEGATIVE
Resp Syncytial Virus by PCR: NEGATIVE
SARS Coronavirus 2 by RT PCR: NEGATIVE

## 2023-09-06 LAB — URINALYSIS, ROUTINE W REFLEX MICROSCOPIC
Bilirubin Urine: NEGATIVE
Glucose, UA: NEGATIVE mg/dL
Ketones, ur: NEGATIVE mg/dL
Nitrite: NEGATIVE
Protein, ur: 30 mg/dL — AB
Specific Gravity, Urine: 1.009 (ref 1.005–1.030)
pH: 6 (ref 5.0–8.0)

## 2023-09-06 LAB — PROTIME-INR
INR: 1.1 (ref 0.8–1.2)
Prothrombin Time: 14.3 s (ref 11.4–15.2)

## 2023-09-06 LAB — COMPREHENSIVE METABOLIC PANEL
ALT: 8 U/L (ref 0–44)
AST: 26 U/L (ref 15–41)
Albumin: 3.6 g/dL (ref 3.5–5.0)
Alkaline Phosphatase: 44 U/L (ref 38–126)
Anion gap: 11 (ref 5–15)
BUN: 73 mg/dL — ABNORMAL HIGH (ref 8–23)
CO2: 27 mmol/L (ref 22–32)
Calcium: 9.3 mg/dL (ref 8.9–10.3)
Chloride: 103 mmol/L (ref 98–111)
Creatinine, Ser: 7.82 mg/dL — ABNORMAL HIGH (ref 0.61–1.24)
GFR, Estimated: 6 mL/min — ABNORMAL LOW (ref >60)
Glucose, Bld: 107 mg/dL — ABNORMAL HIGH (ref 70–99)
Potassium: 4.1 mmol/L (ref 3.5–5.1)
Sodium: 141 mmol/L (ref 135–145)
Total Bilirubin: 0.9 mg/dL (ref <1.2)
Total Protein: 7.2 g/dL (ref 6.5–8.1)

## 2023-09-06 LAB — TROPONIN I (HIGH SENSITIVITY)
Troponin I (High Sensitivity): 458 ng/L (ref <18)
Troponin I (High Sensitivity): 475 ng/L (ref <18)

## 2023-09-06 LAB — APTT: aPTT: 30 s (ref 24–36)

## 2023-09-06 MED ORDER — LACTATED RINGERS IV SOLN: INTRAVENOUS | Status: AC

## 2023-09-06 MED ORDER — SODIUM CHLORIDE 0.9 % IV BOLUS
500.0000 mL | Freq: Once | INTRAVENOUS | Status: AC
  Administered 2023-09-06: 500 mL via INTRAVENOUS

## 2023-09-06 MED ORDER — ASPIRIN 325 MG PO TABS
325.0000 mg | ORAL_TABLET | Freq: Once | ORAL | Status: AC
  Administered 2023-09-06: 325 mg via ORAL
  Filled 2023-09-06: qty 1

## 2023-09-06 MED ORDER — HEPARIN BOLUS VIA INFUSION
4000.0000 [IU] | Freq: Once | INTRAVENOUS | Status: AC
  Administered 2023-09-06: 4000 [IU] via INTRAVENOUS

## 2023-09-06 MED ORDER — LACTATED RINGERS IV SOLN: INTRAVENOUS | Status: DC

## 2023-09-06 MED ORDER — HEPARIN (PORCINE) 25000 UT/250ML-% IV SOLN
950.0000 [IU]/h | INTRAVENOUS | Status: DC
  Administered 2023-09-06 – 2023-09-07 (×2): 950 [IU]/h via INTRAVENOUS
  Filled 2023-09-06 (×2): qty 250

## 2023-09-06 NOTE — Assessment & Plan Note (Addendum)
Follows with neurology.  Baseline dementia, at baseline he can answer simple questions, but on bad days he is barely able to communicate. -Resume Sinemet

## 2023-09-06 NOTE — ED Notes (Signed)
CRITICAL VALUE STICKER  CRITICAL VALUE: troponin 458  MD NOTIFIED: Dr. Suezanne Jacquet  TIME OF NOTIFICATION: [phone]

## 2023-09-06 NOTE — ED Provider Notes (Signed)
[place] EMERGENCY DEPARTMENT AT [facility] Provider Note   CSN: [id number] Arrival date & time: 09/06/23  1347     History  Chief Complaint  Patient presents with   Fatigue    Raymond Burton is a 82 y.o. male.  82 year old male with history of Parkinson's disease, cognitive impairment, and anhedonia who presents emergency department with generalized weakness.  History obtained for the patient's wife and from the patient.  Reports that he has been having generalized weakness for the past 3 days.  Also has had some congestion and mild shortness of breath.  No cough, fevers, chills, chest pain, nausea, vomiting, or diarrhea.  No hematochezia or melena.  No dysuria or frequency.  Was started on sertraline a month ago for anhedonia but says that he does not think he is depressed at this time.  No focal weakness.       Home Medications Prior to Admission medications   Medication Sig Start Date End Date Taking? Authorizing Provider  amLODipine (NORVASC) 10 MG tablet Take 10 mg by mouth daily. 03/25/13   [provider]  aspirin EC 81 MG tablet Take 81 mg by mouth daily.    [provider]  doxazosin (CARDURA) 2 MG tablet Take 2 mg by mouth daily. 03/25/13   [provider]  Garlic (GARLIQUE PO) Take by mouth daily.    [provider]  memantine (NAMENDA) 10 MG tablet Take 10 mg by mouth 2 (two) times daily.    [provider]  Multiple Minerals-Vitamins (CALCIUM-MAGNESIUM-ZINC-D3) TABS Take by mouth daily.    [provider]  pravastatin (PRAVACHOL) 40 MG tablet TAKE 1 TABLET BY MOUTH ONCE DAILY. 09/05/17   Hilty, Lisette Abu, MD  tadalafil (CIALIS) 5 MG tablet Take 5 mg by mouth daily as needed for erectile dysfunction.    [provider]  telmisartan-hydrochlorothiazide (MICARDIS HCT) 80-25 MG per tablet Take 1 tablet by mouth daily. 01/28/13   [provider]      Allergies    Patient has no known  allergies.    Review of Systems   Review of Systems  Physical Exam Updated Vital Signs BP (!) 142/66   Pulse (!) 58   Temp (!) 96.4 F (35.8 C) (Axillary)   Resp (!) 21   Ht 5\' 9"  (1.753 m)   Wt 79.4 kg   SpO2 97%   BMI 25.84 kg/m  Physical Exam Vitals and nursing note reviewed.  Constitutional:      General: He is not in acute distress.    Appearance: He is well-developed.  HENT:     Head: Normocephalic and atraumatic.     Right Ear: External ear normal.     Left Ear: External ear normal.     Nose: Nose normal.  Eyes:     Extraocular Movements: Extraocular movements intact.     Conjunctiva/sclera: Conjunctivae normal.     Pupils: Pupils are equal, round, and reactive to light.  Cardiovascular:     Rate and Rhythm: Normal rate and regular rhythm.     Heart sounds: Normal heart sounds.  Pulmonary:     Effort: Pulmonary effort is normal. No respiratory distress.     Breath sounds: Normal breath sounds.  Abdominal:     General: There is no distension.     Palpations: Abdomen is soft. There is no mass.     Tenderness: There is no abdominal tenderness. There is no guarding.  Musculoskeletal:     Cervical  back: Normal range of motion and neck supple.     Right lower leg: No edema.     Left lower leg: No edema.  Skin:    General: Skin is warm and dry.  Neurological:     Mental Status: He is alert. Mental status is at baseline.     Cranial Nerves: No cranial nerve deficit.     Sensory: No sensory deficit.     Motor: No weakness.  Psychiatric:        Mood and Affect: Mood normal.        Behavior: Behavior normal.     ED Results / Procedures / Treatments   Labs (all labs ordered are listed, but only abnormal results are displayed) Labs Reviewed  CBC - Abnormal; Notable for the following components:      Result Value   RBC 3.55 (*)    Hemoglobin 11.9 (*)    HCT 35.8 (*)    MCV 100.8 (*)    All other components within normal limits  COMPREHENSIVE METABOLIC  PANEL - Abnormal; Notable for the following components:   Glucose, Bld 107 (*)    BUN 73 (*)    Creatinine, Ser 7.82 (*)    GFR, Estimated 6 (*)    All other components within normal limits  TROPONIN I (HIGH SENSITIVITY) - Abnormal; Notable for the following components:   Troponin I (High Sensitivity) 458 (*)    All other components within normal limits  RESP PANEL BY RT-PCR (RSV, FLU A&B, COVID)  [id number]  TSH  T4, FREE  URINALYSIS, ROUTINE W REFLEX MICROSCOPIC  TROPONIN I (HIGH SENSITIVITY)    EKG EKG Interpretation Date/Time:  Wednesday September 06 2023 15:09:48 EST Ventricular Rate:  53 PR Interval:  201 QRS Duration:  130 QT Interval:  494 QTC Calculation: 464 R Axis:   -41  Text Interpretation: Sinus rhythm Nonspecific IVCD with LAD Left ventricular hypertrophy Anterior Q waves, possibly due to LVH Confirmed by Alvino Blood ([id number]) on 09/06/2023 3:30:49 PM  Radiology DG Chest 2 View  Result Date: 09/06/2023 CLINICAL DATA:  Shortness of breath.  Generalized weakness. EXAM: CHEST - 2 VIEW COMPARISON:  None Available. FINDINGS: Bilateral lung fields are clear. Bilateral costophrenic angles are clear. Normal cardio-mediastinal silhouette. No acute osseous abnormalities. The soft tissues are within normal limits. IMPRESSION: No active cardiopulmonary disease. Electronically Signed   By: Jules Schick M.D.   On: 09/06/2023 16:00    Procedures Procedures    Medications Ordered in ED Medications  aspirin tablet 325 mg (325 mg Oral Given 09/06/23 1742)    ED Course/ Medical Decision Making/ A&P Clinical Course as of 09/06/23 1847  Wed Sep 06, 2023  1530 Received sign out from Dr. Eloise Harman presenting with fatigue. Does have some depression w/o SI or HI. Pending labs to evaluate for medical causes of weakness.  [WS]  1530 Signed out to Dr Suezanne Jacquet.  [RP]    Clinical Course User Index [RP] Rondel Baton, MD [WS] Lonell Grandchild, MD                                  Medical Decision Making Amount and/or Complexity of Data Reviewed Labs: ordered. Radiology: ordered.  Risk OTC drugs. Decision regarding hospitalization.   Raymond Burton is a 82 y.o. male with comorbidities that complicate the patient evaluation including Parkinson's disease, cognitive impairment, and anhedonia who presents emergency department with generalized  weakness.   Initial Ddx:  Anemia, hypothyroidism, MI, generalized deconditioning, depression, stroke  MDM/Course:  Patient presents to the emergency department with several days of generalized weakness.  Also has had runny nose and some mild shortness of breath.  No focal neurodeficits on history or exam.  No other gross abnormalities on his exam.  Patient had blood work that was sent including troponin and TSH to assess for MI or hypothyroidism.  Also had basic labs sent off and urinalysis.  With his respiratory symptoms did send COVID and flu and obtain chest x-ray.  Signed out to the oncoming physician awaiting results of his labs.    This patient presents to the ED for concern of complaints listed in HPI, this involves an extensive number of treatment options, and is a complaint that carries with it a high risk of complications and morbidity. Disposition including potential need for admission considered.   Dispo: Pending remainder of workup  Additional history obtained from spouse Records reviewed Outpatient Clinic Notes I personally reviewed and interpreted the pt's EKG: see above for interpretation  I have reviewed the patients home medications and made adjustments as needed Social Determinants of health:  Elderly  Portions of this note were generated with Scientist, clinical (histocompatibility and immunogenetics). Dictation errors may occur despite best attempts at proofreading.           Final Clinical Impression(s) / ED Diagnoses Final diagnoses:  Generalized weakness    Rx / DC Orders ED Discharge Orders     None          Rondel Baton, MD 09/06/23 [phone]

## 2023-09-06 NOTE — Assessment & Plan Note (Signed)
Stable. ?-Resume Norvasc in a.m. ?

## 2023-09-06 NOTE — Assessment & Plan Note (Signed)
Hgba1c

## 2023-09-06 NOTE — Assessment & Plan Note (Signed)
Creatinine markedly elevated at 7.82.  Last checked per chart 2018 creatinine was 1.27 with GFR of 64.  Unknown if baseline CKD, acuity of renal dysfunction.  Family reports chronic poor oral intake, he is also on telmisartan HCTZ.  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  -UA with small hemoglobin, 30 protein -500 mL bolus, continue LR at 100 cc/h for 15 hours -Renal ultrasound -Insert Foley -Please consult nephrology in a.m. -Hold HCTZ telmisartan (25/80)

## 2023-09-06 NOTE — Assessment & Plan Note (Signed)
Sinus bradycardia by EKG.  Heart rate 49-63.  History of bradycardia.  Not on rate limiting medications. -TSH normal at 0.05

## 2023-09-06 NOTE — Assessment & Plan Note (Signed)
Troponin 458 > 475.  Reports some chest pain previously but is unable to tell when I will give details.  Currently chest pain-free.  EKG without ST or T wave changes. - EDP talked to cardiology, does not feel that this is very concerning with normal EKG in the setting of significant renal dysfunction, should be okay for patient to stay at [facility].  -Obtain echocardiogram -Continue heparin drip for now -Cardiology consult -Start atorvastatin 40 daily - Start aspirin 81mg  daily

## 2023-09-06 NOTE — ED Triage Notes (Signed)
Pt arrived POV with c/o worsening fatigue and generalized weakness since last night. Denies any other symptoms.

## 2023-09-06 NOTE — ED Provider Notes (Signed)
  Procedures  .Critical Care  Performed by: Lonell Grandchild, MD Authorized by: Lonell Grandchild, MD   Critical care provider statement:    Critical care time (minutes):  30   Critical care was necessary to treat or prevent imminent or life-threatening deterioration of the following conditions:  Cardiac failure and renal failure   Critical care was time spent personally by me on the following activities:  Development of treatment plan with patient or surrogate, discussions with consultants, evaluation of patient's response to treatment, examination of patient, ordering and review of laboratory studies, ordering and review of radiographic studies, ordering and performing treatments and interventions, pulse oximetry, re-evaluation of patient's condition and review of old charts   Care discussed with: admitting provider     ED Course / MDM   Clinical Course as of 09/06/23 1932  Wed Sep 06, 2023  1530 Received sign out from Dr. Eloise Harman presenting with fatigue. Does have some depression w/o SI or HI. Pending labs to evaluate for medical causes of weakness.  [WS]  1530 Signed out to Dr Suezanne Jacquet.  [RP]  1931 Labs notable for elevated troponin, AKI versus CKD.  No prior creatinines available in chart, note from 2023 outside hospital mentions a normal creatinine.  Family and patient not sure of any history of renal dysfunction.  PVR was performed by nursing and reportedly to be very small.  Will order ultrasound renal.  He does have some vague chest pain intermittently, no pain at the moment.  No shortness of breath, loss of consciousness, diaphoresis.  Discussed with Dr. Excell Seltzer with cardiology, does not feel that this is very concerning with normal EKG in the setting of significant renal dysfunction, should be okay for patient to stay at [facility].  Will start on heparin given elevated troponin.  Discussed with the hospitalist who will admit the patient. [WS]    Clinical Course User Index [RP]  Rondel Baton, MD [WS] Lonell Grandchild, MD   Medical Decision Making Amount and/or Complexity of Data Reviewed Labs: ordered. Radiology: ordered.  Risk OTC drugs. Prescription drug management. Decision regarding hospitalization.          Lonell Grandchild, MD 09/06/23 [phone]

## 2023-09-06 NOTE — Progress Notes (Signed)
PHARMACY - ANTICOAGULATION CONSULT NOTE  Pharmacy Consult for Heparin Infusion Indication: chest pain/ACS  No Known Allergies  Patient Measurements: Height: 5\' 9"  (175.3 cm) Weight: 79.4 kg (175 lb) IBW/kg (Calculated) : 70.7 Heparin Dosing Weight: 79.4 kg  Vital Signs: Temp: 96.4 F (35.8 C) (11/27 1747) Temp Source: Axillary (11/27 1747) BP: 142/66 (11/27 1830) Pulse Rate: 58 (11/27 1830)  Labs: Recent Labs    09/06/23 1521  HGB 11.9*  HCT 35.8*  PLT 197  CREATININE 7.82*  TROPONINIHS 458*    Estimated Creatinine Clearance: 7.4 mL/min (A) (by C-G formula based on SCr of 7.82 mg/dL (H)).   Medical History: Past Medical History:  Diagnosis Date   Bradycardia    Diabetes mellitus without complication (HCC)    Hyperlipidemia    Hypertension    NUCLEAR STRESS TEST, 07/17/2007 - EKG negative ischemia   Memory loss    Murmur    2D ECHO, 02/04/2009 - EF 55-65%, normal   TIA (transient ischemic attack)     Assessment: Patient is a 82 year old male with a past medical history of Parkinson's disease, cognitive impairment, and anhedonia. Patient presented to ED with generalized weakness and was shown to have an elevated Troponin level of 458. Pharmacy was consulted to initiate patient on a heparin infusion. Per chart review, patient is not on anticoagulation prior to admission.  Baseline INR and aPTT ordered. Hgb 11.9. PLT 197.  No signs/symptoms of bleeding noted on exam.  Goal of Therapy:  Heparin level 0.3-0.7 units/ml Monitor platelets by anticoagulation protocol: Yes   Plan:  Give 4000 unit bolus Initiate heparin infusion at a rate of 950 units/hr Check heparin level in 8 hours Monitor CBC daily   Merryl Hacker, PharmD Clinical Pharmacist 09/06/2023,6:48 PM

## 2023-09-06 NOTE — H&P (Signed)
History and Physical    Raymond Burton:[id number] DOB: 01-20-1941 DOA: 09/06/2023  PCP: Renaye Rakers, MD   Patient coming from: Home  I have personally briefly reviewed patient's old medical records in [facility]  Chief Complaint: Weakness  HPI: Raymond Burton is a 82 y.o. male with medical history significant for bradycardia, hypertension, prediabetes, Parkinson's, TIA. Patient was brought to the ED by family with complaints of generalized weakness.  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 Family reports gradual weakness over the past several weeks to months, over the past 3 days it has been more pronounced, with patient lying in bed.  He also reports gradual onset of poor appetite in the same time period, which is different from his baseline.  He denies any pain currently, but reports some mild chest pain a while ago- which he had not mentioned to family previously, he is unable to give further details.  Per family he has been voiding okay.  Reports some nasal congestion recently that appears to have improved.  No difficulty breathing.  No leg swelling.  No vomiting no diarrhea.  They are unaware of any recent blood work done.  Denies history of kidney disease.  Per chart - last blood work was done 2018, and creatinine was 1.27.  He follows with a neurologist for Parkinson's and is on Sinemet and was started on sertraline about 2 weeks ago for anhedonia.  He is on telmisartan HCTZ daily.  ED Course: Temperature 96.4.  Heart rate 49-62.  Respiratory 10-23.  Blood pressure 98-148.  O2 sats greater than 96% on room air. Elevated creatinine 7.82 and BUN 73 COVID test negative. TSH 2. Troponin 458 >> 475. EDP talked to cardiologist Dr. Hughie Closs not feel that this is very concerning with normal EKG in the setting of significant renal dysfunction, should be okay for patient to stay at [facility].  Heparin drip  started.  Review of Systems: As per HPI all other systems reviewed and negative.  Past Medical History:  Diagnosis Date   Bradycardia    Diabetes mellitus without complication (HCC)    Hyperlipidemia    Hypertension    NUCLEAR STRESS TEST, 07/17/2007 - EKG negative ischemia   Memory loss    Murmur    2D ECHO, 02/04/2009 - EF 55-65%, normal   TIA (transient ischemic attack)     History reviewed. No pertinent surgical history.   reports that he has never smoked. He has never used smokeless tobacco. He reports that he does not drink alcohol and does not use drugs.  No Known Allergies  Family History  Problem Relation Age of Onset   Heart attack Mother    Stroke Mother     Prior to Admission medications   Medication Sig Start Date End Date Taking? Authorizing Provider  GARLIC PO Take 1 tablet by mouth daily. 11/03/21  Yes [provider]  NAMZARIC 28-10 MG CP24 Take 1 capsule by mouth at bedtime. 11/03/21  Yes [provider]  sertraline (ZOLOFT) 25 MG tablet Take 1 tablet by mouth daily. 08/22/23 08/21/24 Yes [provider]  amLODipine (NORVASC) 10 MG tablet Take 10 mg by mouth daily. 03/25/13   [provider]  aspirin EC 81 MG tablet Take 81 mg by mouth daily.    [provider]  carbidopa-levodopa (SINEMET IR) 25-100 MG tablet Take 1 tablet by mouth 3 (three) times daily. 12/15/21 04/20/24  [provider]  doxazosin (CARDURA) 2 MG tablet Take 2 mg by mouth daily. 03/25/13   [provider]  memantine (NAMENDA) 10 MG tablet Take 10 mg by mouth 2 (two) times daily.    [provider]  Multiple Minerals-Vitamins (CALCIUM-MAGNESIUM-ZINC-D3) TABS Take by mouth daily.    [provider]  pravastatin (PRAVACHOL) 40 MG tablet TAKE 1 TABLET BY MOUTH ONCE DAILY. 09/05/17   Hilty, Lisette Abu, MD  tadalafil (CIALIS) 5 MG tablet Take 5 mg by mouth daily as needed for erectile dysfunction.    [provider]   tamsulosin (FLOMAX) 0.4 MG CAPS capsule Take 0.4 mg by mouth daily.    [provider]  telmisartan-hydrochlorothiazide (MICARDIS HCT) 80-25 MG per tablet Take 1 tablet by mouth daily. 01/28/13   [provider]    Physical Exam: Vitals:   09/06/23 1745 09/06/23 1747 09/06/23 1800 09/06/23 1830  BP: 135/77 135/77 (!) 148/82 (!) 142/66  Pulse: (!) 49 (!) 58 (!) 56 (!) 58  Resp: 18 (!) 21 (!) 21 (!) 21  Temp:  (!) 96.4 F (35.8 C)    TempSrc:  Axillary    SpO2: 96% 98% 98% 97%  Weight:      Height:        Constitutional: NAD, calm, comfortable Vitals:   09/06/23 1745 09/06/23 1747 09/06/23 1800 09/06/23 1830  BP: 135/77 135/77 (!) 148/82 (!) 142/66  Pulse: (!) 49 (!) 58 (!) 56 (!) 58  Resp: 18 (!) 21 (!) 21 (!) 21  Temp:  (!) 96.4 F (35.8 C)    TempSrc:  Axillary    SpO2: 96% 98% 98% 97%  Weight:      Height:       Eyes: PERRL, lids and conjunctivae normal ENMT: Mucous membranes are moist.  Neck: normal, supple, no masses, no thyromegaly Respiratory: clear to auscultation bilaterally, no wheezing, no crackles. Normal respiratory effort. No accessory muscle use.  Cardiovascular: Regular rate and rhythm, no murmurs / rubs / gallops. No extremity edema. 2+ pedal pulses.  Extremities warm Abdomen: no tenderness, no masses palpated. No hepatosplenomegaly. Bowel sounds positive.  Musculoskeletal: no clubbing / cyanosis. No joint deformity upper and lower extremities.  Skin: no rashes, lesions, ulcers. No induration Neurologic: No facial asymmetry, 5/5 strength all extremities.  Speech fluent. Psychiatric: Awake and alert able to answer questions, but paucity of speech likely due to baseline dementia.  Oriented to person and place but unable to tell me why he is here.  Mental status speech at baseline.  Labs on Admission: I have personally reviewed following labs and imaging studies  CBC: Recent Labs  Lab 09/06/23 1521  WBC 6.0  HGB 11.9*  HCT 35.8*  MCV  100.8*  PLT 197   Basic Metabolic Panel: Recent Labs  Lab 09/06/23 1521  NA 141  K 4.1  CL 103  CO2 27  GLUCOSE 107*  BUN 73*  CREATININE 7.82*  CALCIUM 9.3   GFR: Estimated Creatinine Clearance: 7.4 mL/min (A) (by C-G formula based on SCr of 7.82 mg/dL (H)). Liver Function Tests: Recent Labs  Lab 09/06/23 1521  AST 26  ALT 8  ALKPHOS 44  BILITOT 0.9  PROT 7.2  ALBUMIN 3.6   Thyroid Function Tests: Recent Labs    09/06/23 1521  TSH 2.050   Urine analysis:    Component Value Date/Time   COLORURINE YELLOW 09/06/2023 1853   APPEARANCEUR CLEAR 09/06/2023 1853   LABSPEC 1.009 09/06/2023 1853   PHURINE 6.0 09/06/2023  1853   GLUCOSEU NEGATIVE 09/06/2023 1853   HGBUR SMALL (A) 09/06/2023 1853   BILIRUBINUR NEGATIVE 09/06/2023 1853   KETONESUR NEGATIVE 09/06/2023 1853   PROTEINUR 30 (A) 09/06/2023 1853   NITRITE NEGATIVE 09/06/2023 1853   LEUKOCYTESUR TRACE (A) 09/06/2023 1853    Radiological Exams on Admission: DG Chest 2 View  Result Date: 09/06/2023 CLINICAL DATA:  Shortness of breath.  Generalized weakness. EXAM: CHEST - 2 VIEW COMPARISON:  None Available. FINDINGS: Bilateral lung fields are clear. Bilateral costophrenic angles are clear. Normal cardio-mediastinal silhouette. No acute osseous abnormalities. The soft tissues are within normal limits. IMPRESSION: No active cardiopulmonary disease. Electronically Signed   By: Jules Schick M.D.   On: 09/06/2023 16:00    EKG: Independently reviewed.  Sinus bradycardia rate 53, QTc 464, normal PR interval 201.  No significant change from prior.  Assessment/Plan Principal Problem:   AKI (acute kidney injury) (HCC) Active Problems:   Elevated troponin   Essential hypertension   Bradycardia   Pre-diabetes   Parkinson disease (HCC)  Assessment and Plan: * AKI (acute kidney injury) (HCC) Creatinine markedly elevated at 7.82.  Last checked per chart 2018 creatinine was 1.27 with GFR of 64.  Unknown if baseline  CKD, acuity of renal dysfunction.  Family reports chronic poor oral intake, he is also on telmisartan HCTZ.  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  -UA with small hemoglobin, 30 protein -500 mL bolus, continue LR at 100 cc/h for 15 hours -Renal ultrasound -Insert Foley -Please consult nephrology in a.m. -Hold HCTZ telmisartan (25/80)  Elevated troponin Troponin 458 > 475.  Reports some chest pain previously but is unable to tell when I will give details.  Currently chest pain-free.  EKG without ST or T wave changes. - EDP talked to cardiology, does not feel that this is very concerning with normal EKG in the setting of significant renal dysfunction, should be okay for patient to stay at [facility].  -Obtain echocardiogram -Continue heparin drip for now -Cardiology consult -Start atorvastatin 40 daily - Start aspirin 81mg  daily  Parkinson disease [facility]) Follows with neurology.  Baseline dementia, at baseline he can answer simple questions, but on bad days he is barely able to communicate. -Resume Sinemet   Pre-diabetes - Hgba1c  Bradycardia Sinus bradycardia by EKG.  Heart rate 49-63.  History of bradycardia.  Not on rate limiting medications. -TSH normal at 0.05  Essential hypertension Stable. -Resume Norvasc in a.m.   DVT prophylaxis: Heparin Code Status: FULL Family Communication: Spouse and son at bedside Disposition Plan: > 2 days Consults called: Please consult nephrology in a.m. Admission status: Inpt tele I certify that at the point of admission it is my clinical judgment that the patient will require inpatient hospital care spanning beyond 2 midnights from the point of admission due to high intensity of service, high risk for further deterioration and high frequency of surveillance required.   Author: Onnie Boer, MD 09/06/2023 8:34 PM  For on call review [email or web address].

## 2023-09-07 ENCOUNTER — Inpatient Hospital Stay (HOSPITAL_COMMUNITY): Payer: Medicare PPO

## 2023-09-07 ENCOUNTER — Other Ambulatory Visit: Payer: Self-pay

## 2023-09-07 DIAGNOSIS — R7989 Other specified abnormal findings of blood chemistry: Secondary | ICD-10-CM | POA: Diagnosis not present

## 2023-09-07 DIAGNOSIS — R001 Bradycardia, unspecified: Secondary | ICD-10-CM | POA: Diagnosis not present

## 2023-09-07 DIAGNOSIS — N179 Acute kidney failure, unspecified: Secondary | ICD-10-CM | POA: Diagnosis not present

## 2023-09-07 LAB — ECHOCARDIOGRAM COMPLETE
Area-P 1/2: 4.36 cm2
Height: 69 in
S' Lateral: 2.5 cm
Weight: 2726.65 [oz_av]

## 2023-09-07 LAB — BASIC METABOLIC PANEL
Anion gap: 13 (ref 5–15)
BUN: 72 mg/dL — ABNORMAL HIGH (ref 8–23)
CO2: 24 mmol/L (ref 22–32)
Calcium: 8.7 mg/dL — ABNORMAL LOW (ref 8.9–10.3)
Chloride: 104 mmol/L (ref 98–111)
Creatinine, Ser: 8.02 mg/dL — ABNORMAL HIGH (ref 0.61–1.24)
GFR, Estimated: 6 mL/min — ABNORMAL LOW (ref >60)
Glucose, Bld: 88 mg/dL (ref 70–99)
Potassium: 3.9 mmol/L (ref 3.5–5.1)
Sodium: 141 mmol/L (ref 135–145)

## 2023-09-07 LAB — PROTEIN / CREATININE RATIO, URINE
Creatinine, Urine: 82 mg/dL
Protein Creatinine Ratio: 1.59 mg/mg{creat} — ABNORMAL HIGH (ref 0.00–0.15)
Total Protein, Urine: 130 mg/dL

## 2023-09-07 LAB — HEMOGLOBIN A1C
Hgb A1c MFr Bld: 6 % — ABNORMAL HIGH (ref 4.8–5.6)
Mean Plasma Glucose: 125.5 mg/dL

## 2023-09-07 LAB — HEPATITIS B SURFACE ANTIGEN: Hepatitis B Surface Ag: NONREACTIVE

## 2023-09-07 LAB — HIV ANTIBODY (ROUTINE TESTING W REFLEX): HIV Screen 4th Generation wRfx: NONREACTIVE

## 2023-09-07 LAB — NA AND K (SODIUM & POTASSIUM), RAND UR
Potassium Urine: 29 mmol/L
Sodium, Ur: 56 mmol/L

## 2023-09-07 LAB — TROPONIN I (HIGH SENSITIVITY): Troponin I (High Sensitivity): 424 ng/L (ref <18)

## 2023-09-07 LAB — HEPARIN LEVEL (UNFRACTIONATED)
Heparin Unfractionated: 0.35 [IU]/mL (ref 0.30–0.70)
Heparin Unfractionated: 0.34 [IU]/mL (ref 0.30–0.70)

## 2023-09-07 LAB — HEPATITIS C ANTIBODY: HCV Ab: NONREACTIVE

## 2023-09-07 MED ORDER — ASPIRIN 81 MG PO TBEC
81.0000 mg | DELAYED_RELEASE_TABLET | Freq: Every day | ORAL | Status: DC
  Administered 2023-09-07 – 2023-09-19 (×13): 81 mg via ORAL
  Filled 2023-09-07 (×13): qty 1

## 2023-09-07 MED ORDER — ONDANSETRON HCL 4 MG PO TABS: 4.0000 mg | ORAL_TABLET | Freq: Four times a day (QID) | ORAL | Status: DC | PRN

## 2023-09-07 MED ORDER — CHLORHEXIDINE GLUCONATE CLOTH 2 % EX PADS
6.0000 | MEDICATED_PAD | Freq: Every day | CUTANEOUS | Status: DC
  Administered 2023-09-07 – 2023-09-19 (×13): 6 via TOPICAL

## 2023-09-07 MED ORDER — ATORVASTATIN CALCIUM 40 MG PO TABS
40.0000 mg | ORAL_TABLET | Freq: Every day | ORAL | Status: DC
  Administered 2023-09-07 – 2023-09-19 (×13): 40 mg via ORAL
  Filled 2023-09-07 (×13): qty 1

## 2023-09-07 MED ORDER — ACETAMINOPHEN 325 MG PO TABS
650.0000 mg | ORAL_TABLET | Freq: Four times a day (QID) | ORAL | Status: DC | PRN
  Administered 2023-09-11 – 2023-09-15 (×5): 650 mg via ORAL
  Filled 2023-09-07 (×5): qty 2

## 2023-09-07 MED ORDER — MUPIROCIN 2 % EX OINT: 1.0000 | TOPICAL_OINTMENT | Freq: Two times a day (BID) | CUTANEOUS | Status: DC

## 2023-09-07 MED ORDER — ONDANSETRON HCL 4 MG/2ML IJ SOLN: 4.0000 mg | Freq: Four times a day (QID) | INTRAMUSCULAR | Status: DC | PRN

## 2023-09-07 MED ORDER — LACTATED RINGERS IV SOLN: Freq: Once | INTRAVENOUS | Status: AC

## 2023-09-07 MED ORDER — TAMSULOSIN HCL 0.4 MG PO CAPS
0.4000 mg | ORAL_CAPSULE | Freq: Every day | ORAL | Status: DC
  Administered 2023-09-07 – 2023-09-19 (×13): 0.4 mg via ORAL
  Filled 2023-09-07 (×13): qty 1

## 2023-09-07 MED ORDER — AMLODIPINE BESYLATE 5 MG PO TABS
10.0000 mg | ORAL_TABLET | Freq: Every day | ORAL | Status: DC
  Administered 2023-09-07 – 2023-09-14 (×8): 10 mg via ORAL
  Filled 2023-09-07 (×9): qty 2

## 2023-09-07 MED ORDER — ACETAMINOPHEN 650 MG RE SUPP: 650.0000 mg | Freq: Four times a day (QID) | RECTAL | Status: DC | PRN

## 2023-09-07 MED ORDER — POLYETHYLENE GLYCOL 3350 17 G PO PACK: 17.0000 g | PACK | Freq: Every day | ORAL | Status: DC | PRN

## 2023-09-07 NOTE — Progress Notes (Signed)
PHARMACY - ANTICOAGULATION CONSULT NOTE  Pharmacy Consult for Heparin Infusion Indication: chest pain/ACS  No Known Allergies  Patient Measurements: Height: 5\' 9"  (175.3 cm) Weight: 77.3 kg (170 lb 6.7 oz) IBW/kg (Calculated) : 70.7 Heparin Dosing Weight: 79.4 kg  Vital Signs: Temp: 98.2 F (36.8 C) (11/28 0300) Temp Source: Oral (11/28 0300) BP: 120/73 (11/28 0300) Pulse Rate: 62 (11/28 0300)  Labs: Recent Labs    09/06/23 1521 09/06/23 1757 09/07/23 0408  HGB 11.9*  --   --   HCT 35.8*  --   --   PLT 197  --   --   APTT 30  --   --   LABPROT 14.3  --   --   INR 1.1  --   --   HEPARINUNFRC  --   --  0.35  CREATININE 7.82*  --   --   TROPONINIHS 458* 475*  --     Estimated Creatinine Clearance: 7.4 mL/min (A) (by C-G formula based on SCr of 7.82 mg/dL (H)).   Medical History: Past Medical History:  Diagnosis Date   Bradycardia    Diabetes mellitus without complication (HCC)    Hyperlipidemia    Hypertension    NUCLEAR STRESS TEST, 07/17/2007 - EKG negative ischemia   Memory loss    Murmur    2D ECHO, 02/04/2009 - EF 55-65%, normal   TIA (transient ischemic attack)     Assessment: Patient is a 82 year old male with a past medical history of Parkinson's disease, cognitive impairment, and anhedonia. Patient presented to ED with generalized weakness and was shown to have an elevated Troponin level of 458. Pharmacy was consulted to initiate patient on a heparin infusion. Per chart review, patient is not on anticoagulation prior to admission.  Baseline INR and aPTT ordered. Hgb 11.9. PLT 197.  No signs/symptoms of bleeding noted on exam.   11/28 AM update:  Initial heparin level therapeutic   Goal of Therapy:  Heparin level 0.3-0.7 units/ml Monitor platelets by anticoagulation protocol: Yes   Plan:  Cont heparin 950 units/hr Heparin level in 6-8 hours  Abran Duke, PharmD, BCPS Clinical Pharmacist Phone: [phone]

## 2023-09-07 NOTE — Plan of Care (Signed)
  Problem: Education: Goal: Knowledge of General Education information will improve Description Including pain rating scale, medication(s)/side effects and non-pharmacologic comfort measures Outcome: Progressing   Problem: Health Behavior/Discharge Planning: Goal: Ability to manage health-related needs will improve Outcome: Progressing   

## 2023-09-07 NOTE — Progress Notes (Signed)
PHARMACY - ANTICOAGULATION CONSULT NOTE  Pharmacy Consult for Heparin Infusion Indication: chest pain/ACS  No Known Allergies  Patient Measurements: Height: 5\' 9"  (175.3 cm) Weight: 77.3 kg (170 lb 6.7 oz) IBW/kg (Calculated) : 70.7 Heparin Dosing Weight: 79.4 kg  Vital Signs: Temp: 98.2 F (36.8 C) (11/28 0300) Temp Source: Oral (11/28 0300) BP: 120/73 (11/28 0300) Pulse Rate: 62 (11/28 0300)  Labs: Recent Labs    09/06/23 1521 09/06/23 1757 09/07/23 0408 09/07/23 1139  HGB 11.9*  --   --   --   HCT 35.8*  --   --   --   PLT 197  --   --   --   APTT 30  --   --   --   LABPROT 14.3  --   --   --   INR 1.1  --   --   --   HEPARINUNFRC  --   --  0.35 0.34  CREATININE 7.82*  --  8.02*  --   TROPONINIHS 458* 475* 424*  --     Estimated Creatinine Clearance: 7.2 mL/min (A) (by C-G formula based on SCr of 8.02 mg/dL (H)).   Medical History: Past Medical History:  Diagnosis Date   Bradycardia    Diabetes mellitus without complication (HCC)    Hyperlipidemia    Hypertension    NUCLEAR STRESS TEST, 07/17/2007 - EKG negative ischemia   Memory loss    Murmur    2D ECHO, 02/04/2009 - EF 55-65%, normal   TIA (transient ischemic attack)     Assessment: Patient is a 82 year old male with a past medical history of Parkinson's disease, cognitive impairment, and anhedonia. Patient presented to ED with generalized weakness and was shown to have an elevated Troponin level of 458. Pharmacy was consulted to initiate patient on a heparin infusion. Per chart review, patient is not on anticoagulation prior to admission.  HL 0.34- therapeutic CBC WNL Trop 424   Goal of Therapy:  Heparin level 0.3-0.7 units/ml Monitor platelets by anticoagulation protocol: Yes   Plan:  Cont heparin 950 units/hr Daily heparin level  Judeth Cornfield, PharmD Clinical Pharmacist 09/07/2023 12:05 PM

## 2023-09-07 NOTE — Progress Notes (Signed)
PROGRESS NOTE    Raymond Burton  ZOX:[id number] DOB: Feb 06, 1941 DOA: 09/06/2023 PCP: Renaye Rakers, MD   Brief Narrative:    Raymond Burton is a 82 y.o. male with medical history significant for bradycardia, hypertension, prediabetes, Parkinson's, TIA. Patient was brought to the ED by family with complaints of generalized weakness. 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 Family reports gradual weakness over the past several weeks to months, over the past 3 days it has been more pronounced, with patient lying in bed.  He also reports gradual onset of poor appetite in the same time period, which is different from his baseline.  He has been admitted for AKI versus progression of CKD as well as elevated troponin levels.  Nephrology evaluation pending.  Assessment & Plan:   Principal Problem:   AKI (acute kidney injury) (HCC) Active Problems:   Elevated troponin   Essential hypertension   Bradycardia   Pre-diabetes   Parkinson disease (HCC)  Assessment and Plan:    AKI (acute kidney injury) (HCC) versus progression of CKD Creatinine markedly elevated at 7.82.  Last checked per chart 2018 creatinine was 1.27 with GFR of 64.  Unknown if baseline CKD, acuity of renal dysfunction.  Family reports chronic poor oral intake, he is also on telmisartan HCTZ.  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  -UA with small hemoglobin, 30 protein -500 mL bolus, continue LR at 100 cc/h for 15 hours -Renal ultrasound pending -Insert Foley -Appreciate nephrology evaluation -Hold HCTZ telmisartan (25/80)   Elevated troponin Troponin 458 > 475.  Reports some chest pain previously but is unable to tell when I will give details.  Currently chest pain-free.  EKG without ST or T wave changes. - EDP talked to cardiology, does not feel that this is very concerning with normal EKG in the setting of significant renal dysfunction, should be okay  for patient to stay at [facility].  -Obtain echocardiogram -Continue heparin drip for now for total 48 hours -Cardiology consult -Start atorvastatin 40 daily - Start aspirin 81mg  daily   Parkinson disease (HCC) Follows with neurology.  Baseline dementia, at baseline he can answer simple questions, but on bad days he is barely able to communicate. -Resume Sinemet     Pre-diabetes - Hgba1c   Bradycardia Sinus bradycardia by EKG.  Heart rate 49-63.  History of bradycardia.  Not on rate limiting medications. -TSH normal at 2.05   Essential hypertension Stable. -Currently on Norvasc    DVT prophylaxis:Heparin drip Code Status: Full Family Communication: Wife present at bedside Disposition Plan:  Status is: Inpatient Remains inpatient appropriate because: Need for IV fluid.   Consultants:  Nephrology  Procedures:  None  Antimicrobials:     Subjective: Patient seen and evaluated today with no new acute complaints or concerns. No acute concerns or events noted overnight.  Objective: Vitals:   09/06/23 1915 09/06/23 2013 09/06/23 2348 09/07/23 0300  BP: 137/74 (!) 141/79 130/69 120/73  Pulse: 61 63 72 62  Resp: 18 18 17 18   Temp:  98.3 F (36.8 C) 98.4 F (36.9 C) 98.2 F (36.8 C)  TempSrc:  Oral Oral Oral  SpO2: 97% 98% 97% 98%  Weight:  77.3 kg    Height:        Intake/Output Summary (Last 24 hours) at 09/07/2023 0641 Last data filed at 09/07/2023 0604 Gross per 24 hour  Intake 993.39 ml  Output 900 ml  Net 93.39  ml   Filed Weights   09/06/23 1402 09/06/23 2013  Weight: 79.4 kg 77.3 kg    Examination:  General exam: Appears calm and comfortable  Respiratory system: Clear to auscultation. Respiratory effort normal. Cardiovascular system: S1 & S2 heard, RRR.  Gastrointestinal system: Abdomen is soft Central nervous system: Alert and awake Extremities: No edema Skin: No significant lesions noted Psychiatry: Flat affect.    Data Reviewed: I  have personally reviewed following labs and imaging studies  CBC: Recent Labs  Lab 09/06/23 1521  WBC 6.0  HGB 11.9*  HCT 35.8*  MCV 100.8*  PLT 197   Basic Metabolic Panel: Recent Labs  Lab 09/06/23 1521  NA 141  K 4.1  CL 103  CO2 27  GLUCOSE 107*  BUN 73*  CREATININE 7.82*  CALCIUM 9.3   GFR: Estimated Creatinine Clearance: 7.4 mL/min (A) (by C-G formula based on SCr of 7.82 mg/dL (H)). Liver Function Tests: Recent Labs  Lab 09/06/23 1521  AST 26  ALT 8  ALKPHOS 44  BILITOT 0.9  PROT 7.2  ALBUMIN 3.6   No results for input(s): "LIPASE", "AMYLASE" in the last 168 hours. No results for input(s): "AMMONIA" in the last 168 hours. Coagulation Profile: Recent Labs  Lab 09/06/23 1521  INR 1.1   Cardiac Enzymes: No results for input(s): "CKTOTAL", "CKMB", "CKMBINDEX", "TROPONINI" in the last 168 hours. BNP (last 3 results) No results for input(s): "PROBNP" in the last 8760 hours. HbA1C: No results for input(s): "HGBA1C" in the last 72 hours. CBG: No results for input(s): "GLUCAP" in the last 168 hours. Lipid Profile: No results for input(s): "CHOL", "HDL", "LDLCALC", "TRIG", "CHOLHDL", "LDLDIRECT" in the last 72 hours. Thyroid Function Tests: Recent Labs    09/06/23 1521  TSH 2.050  FREET4 0.88   Anemia Panel: No results for input(s): "VITAMINB12", "FOLATE", "FERRITIN", "TIBC", "IRON", "RETICCTPCT" in the last 72 hours. Sepsis Labs: No results for input(s): "PROCALCITON", "LATICACIDVEN" in the last 168 hours.  Recent Results (from the past 240 hour(s))  Resp panel by RT-PCR (RSV, Flu A&B, Covid) Anterior Nasal Swab     Status: None   Collection Time: 09/06/23  3:21 PM   Specimen: Anterior Nasal Swab  Result Value Ref Range Status   SARS Coronavirus 2 by RT PCR NEGATIVE NEGATIVE Final    Comment: (NOTE) SARS-CoV-2 target nucleic acids are NOT DETECTED.  The SARS-CoV-2 RNA is generally detectable in upper respiratory specimens during the acute  phase of infection. The lowest concentration of SARS-CoV-2 viral copies this assay can detect is 138 copies/mL. A negative result does not preclude SARS-Cov-2 infection and should not be used as the sole basis for treatment or other patient management decisions. A negative result may occur with  improper specimen collection/handling, submission of specimen other than nasopharyngeal swab, presence of viral mutation(s) within the areas targeted by this assay, and inadequate number of viral copies(<138 copies/mL). A negative result must be combined with clinical observations, patient history, and epidemiological information. The expected result is Negative.  Fact Sheet for Patients:  BloggerCourse.com  Fact Sheet for Healthcare Providers:  SeriousBroker.it  This test is no t yet approved or cleared by the [place] FDA and  has been authorized for detection and/or diagnosis of SARS-CoV-2 by FDA under an Emergency Use Authorization (EUA). This EUA will remain  in effect (meaning this test can be used) for the duration of the COVID-19 declaration under Section 564(b)(1) of the Act, 21 U.S.C.section 360bbb-3(b)(1), unless the authorization is terminated  or revoked sooner.       Influenza A by PCR NEGATIVE NEGATIVE Final   Influenza B by PCR NEGATIVE NEGATIVE Final    Comment: (NOTE) The Xpert Xpress SARS-CoV-2/FLU/RSV plus assay is intended as an aid in the diagnosis of influenza from Nasopharyngeal swab specimens and should not be used as a sole basis for treatment. Nasal washings and aspirates are unacceptable for Xpert Xpress SARS-CoV-2/FLU/RSV testing.  Fact Sheet for Patients: BloggerCourse.com  Fact Sheet for Healthcare Providers: SeriousBroker.it  This test is not yet approved or cleared by the [place] FDA and has been authorized for detection and/or diagnosis of  SARS-CoV-2 by FDA under an Emergency Use Authorization (EUA). This EUA will remain in effect (meaning this test can be used) for the duration of the COVID-19 declaration under Section 564(b)(1) of the Act, 21 U.S.C. section 360bbb-3(b)(1), unless the authorization is terminated or revoked.     Resp Syncytial Virus by PCR NEGATIVE NEGATIVE Final    Comment: (NOTE) Fact Sheet for Patients: BloggerCourse.com  Fact Sheet for Healthcare Providers: SeriousBroker.it  This test is not yet approved or cleared by the [place] FDA and has been authorized for detection and/or diagnosis of SARS-CoV-2 by FDA under an Emergency Use Authorization (EUA). This EUA will remain in effect (meaning this test can be used) for the duration of the COVID-19 declaration under Section 564(b)(1) of the Act, 21 U.S.C. section 360bbb-3(b)(1), unless the authorization is terminated or revoked.  Performed at [facility], [address] [place], [place] [address]          Radiology Studies: DG Chest 2 View  Result Date: 09/06/2023 CLINICAL DATA:  Shortness of breath.  Generalized weakness. EXAM: CHEST - 2 VIEW COMPARISON:  None Available. FINDINGS: Bilateral lung fields are clear. Bilateral costophrenic angles are clear. Normal cardio-mediastinal silhouette. No acute osseous abnormalities. The soft tissues are within normal limits. IMPRESSION: No active cardiopulmonary disease. Electronically Signed   By: Jules Schick M.D.   On: 09/06/2023 16:00        Scheduled Meds:  amLODipine  10 mg Oral Daily   aspirin EC  81 mg Oral Daily   atorvastatin  40 mg Oral Daily   tamsulosin  0.4 mg Oral Daily   Continuous Infusions:  heparin 950 Units/hr (09/07/23 0604)   lactated ringers 100 mL/hr at 09/06/23 2122     LOS: 1 day    Time spent: 55 minutes    Lawerence Dery D Sherryll Burger, DO [facility]  If 7PM-7AM, please contact  night-coverage [email or web address] 09/07/2023, 6:41 AM

## 2023-09-07 NOTE — Consult Note (Addendum)
[facility] Nephrology Consultation Note  Requesting MD: Dr. Sherryll Burger, Pratik Reason for consult: AKI  HPI:  Raymond Burton is a 82 y.o. male with past medical history significant for hypertension, prediabetes, HLD, Parkinson's dementia, TIA who was presented to the hospital on 11/27 because of worsening fatigue and generalized weakness, seen as a consultation for the evaluation of acute kidney injury. Reportedly, the patient has been feeling weak for the last several months with poor appetite associated with generalized weakness.  Yesterday morning he did not wake up at his usual time and was not feeling well associated with some chest discomfort.  In the ER, he was on room air.  Blood pressure acceptable and afebrile.  The labs showed creatinine level of 7.82, BUN 73, potassium 4.1, troponin 458, hemoglobin 11.9.  The urinalysis with protein 30, no RBC.  Chest x-ray with no acute finding.  The kidney ultrasound showed right kidney 13.4 cm, left kidney 12 cm with diffusely echogenic renal parenchyma and a complex cyst which require outpatient follow-up.  No hydronephrosis.  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  The patient was treated with IV fluid, IV heparin.  Cardiology consult was obtained and receiving echocardiogram.  Troponinemia thought to be due to low GFR. The baseline creatinine level unknown however H&P reported that the creatinine level was around 1.27 back in 2018. No recent lab results available to review even in Care Everywhere. Urine output is recorded around 900 cc. Currently patient is alert awake with some confusion.  He is getting echocardiogram.  He denies nausea, vomiting, dysgeusia, chest pain or shortness of breath.  Review of system is unreliable.  His wife was present at the bedside.  I spoke with the patient's niece Dr. Ida Rogue who is a nephrologist at [facility].   PMHx:   Past Medical  History:  Diagnosis Date   Bradycardia    Diabetes mellitus without complication (HCC)    Hyperlipidemia    Hypertension    NUCLEAR STRESS TEST, 07/17/2007 - EKG negative ischemia   Memory loss    Murmur    2D ECHO, 02/04/2009 - EF 55-65%, normal   TIA (transient ischemic attack)     History reviewed. No pertinent surgical history.  Family Hx:  Family History  Problem Relation Age of Onset   Heart attack Mother    Stroke Mother     Social History:  reports that he has never smoked. He has never used smokeless tobacco. He reports that he does not drink alcohol and does not use drugs.  Allergies: No Known Allergies  Medications: Prior to Admission medications   Medication Sig Start Date End Date Taking? Authorizing Provider  amLODipine (NORVASC) 10 MG tablet Take 10 mg by mouth daily. 03/25/13  Yes [provider]  carbidopa-levodopa (SINEMET IR) 25-100 MG tablet Take 1 tablet by mouth 3 (three) times daily. 12/15/21 04/20/24 Yes [provider]  GARLIC PO Take 1 tablet by mouth daily. 11/03/21  Yes [provider]  Multiple Vitamin (MULTIVITAMIN) tablet Take 1 tablet by mouth daily.   Yes [provider]  NAMZARIC 28-10 MG CP24 Take 1 capsule by mouth at bedtime. 11/03/21  Yes [provider]  pravastatin (PRAVACHOL) 40 MG tablet TAKE 1 TABLET BY MOUTH ONCE DAILY. 09/05/17  Yes Hilty, Lisette Abu, MD  sertraline (ZOLOFT) 25 MG tablet Take 1 tablet by mouth daily. 08/22/23 08/21/24 Yes [provider]  tamsulosin (FLOMAX) 0.4 MG CAPS  capsule Take 0.4 mg by mouth daily.   Yes [provider]  telmisartan-hydrochlorothiazide (MICARDIS HCT) 80-25 MG per tablet Take 1 tablet by mouth daily. 01/28/13  Yes [provider]    I have reviewed the patient's current medications.  Labs: Renal Panel: Recent Labs  Lab 09/06/23 1521 09/07/23 0408  NA 141 141  K 4.1 3.9  CL 103 104  CO2 27 24  GLUCOSE 107* 88  BUN 73* 72*   CREATININE 7.82* 8.02*  CALCIUM 9.3 8.7*     CBC:    Latest Ref Rng & Units 09/06/2023    3:21 PM  CBC  WBC 4.0 - 10.5 K/uL 6.0   Hemoglobin 13.0 - 17.0 g/dL 11.9   Hematocrit 14.7 - 52.0 % 35.8   Platelets 150 - 400 K/uL 197      Anemia Panel:  Recent Labs    09/06/23 1521  HGB 11.9*  MCV 100.8*    Recent Labs  Lab 09/06/23 1521  AST 26  ALT 8  ALKPHOS 44  BILITOT 0.9  PROT 7.2  ALBUMIN 3.6    No results found for: "HGBA1C"  ROS:  Pertinent items noted in HPI and remainder of comprehensive ROS otherwise negative.  Physical Exam: Vitals:   09/06/23 2348 09/07/23 0300  BP: 130/69 120/73  Pulse: 72 62  Resp: 17 18  Temp: 98.4 F (36.9 C) 98.2 F (36.8 C)  SpO2: 97% 98%     General exam: Appears calm and comfortable  Respiratory system: Clear to auscultation. Respiratory effort normal. No wheezing or crackle Cardiovascular system: S1 & S2 heard, RRR.  No pedal edema. Gastrointestinal system: Abdomen is nondistended, soft and nontender. Normal bowel sounds heard. Central nervous system: Alert and oriented. No focal neurological deficits. Extremities: Symmetric 5 x 5 power. Skin: No rashes, lesions or ulcers Psychiatry: alert, awake and following commands.  Assessment/Plan:  # Acute kidney injury, nonoliguric: likely Ischemic ATN in the setting of poor oral intake/dehydration (use of telmisartan/hydrochlorothiazide) versus progressive CKD.  No recent baseline creatinine level available to compare however presented with creatinine level around 8 with signs or symptoms of uremia.  UA with minimal protein, no hematuria.  Kidney ultrasound showed echogenic parenchyma consistent with CKD however no hydronephrosis. I will quantify proteinuria with UPC and send serologies including ANA, C3, C4, hep B, hep C, HIV, kappa lambda ratio, protein electrophoresis. I have discussed with the patient's wife and his niece Dr. Ida Rogue who is a nephrologist at [facility] about low GFR associated with uremic symptoms and possible need for starting dialysis.  It came to as a shock to the family member and they are still processing the information.  We decided to treat with IV fluid today while ongoing discussion about dialysis.  Repeat lab in the morning. I will keep him npo incase if he need HD catheter in am. The labs including electrolytes and volume status acceptable therefore no urgent need for HD today. If patient needs dialysis, the family are interested in PD. Continue to hold ARB/diuretics Currently on IV fluid, LR continue. Strict ins and out and daily lab.  # Elevated troponin/chest pain: Cardiology was consulted.  Currently on IV heparin.  Getting echocardiogram.  # History of hypertension: Blood pressure acceptable.  Currently on amlodipine.  ACE inhibitor/ARB on hold because of AKI.  # CKD-MBD: Check PTH and phosphorus level, calcium acceptable.  # Anemia of CKD: Hemoglobin at goal.  Continue to monitor.  # Memory impairment/Parkinson disease: On  Sinemet.  Thank you for the consult.  We will continue to follow.  D/w primary team Dr. Sherryll Burger as well.  Kahley Leib [name] 09/07/2023, 10:53 AM  [employer].

## 2023-09-07 NOTE — Progress Notes (Signed)
  Echocardiogram 2D Echocardiogram has been performed.  Delcie Roch 09/07/2023, 11:29 AM

## 2023-09-08 DIAGNOSIS — N179 Acute kidney failure, unspecified: Secondary | ICD-10-CM | POA: Diagnosis not present

## 2023-09-08 LAB — CBC
HCT: 29.9 % — ABNORMAL LOW (ref 39.0–52.0)
Hemoglobin: 10.2 g/dL — ABNORMAL LOW (ref 13.0–17.0)
MCH: 33.7 pg (ref 26.0–34.0)
MCHC: 34.1 g/dL (ref 30.0–36.0)
MCV: 98.7 fL (ref 80.0–100.0)
Platelets: 193 10*3/uL (ref 150–400)
RBC: 3.03 MIL/uL — ABNORMAL LOW (ref 4.22–5.81)
RDW: 12.5 % (ref 11.5–15.5)
WBC: 5.4 10*3/uL (ref 4.0–10.5)
nRBC: 0 % (ref 0.0–0.2)

## 2023-09-08 LAB — IRON AND TIBC
Iron: 80 ug/dL (ref 45–182)
Saturation Ratios: 53 % — ABNORMAL HIGH (ref 17.9–39.5)
TIBC: 150 ug/dL — ABNORMAL LOW (ref 250–450)
UIBC: 70 ug/dL

## 2023-09-08 LAB — SURGICAL PCR SCREEN
MRSA, PCR: NEGATIVE
Staphylococcus aureus: NEGATIVE

## 2023-09-08 LAB — BASIC METABOLIC PANEL
Anion gap: 12 (ref 5–15)
BUN: 69 mg/dL — ABNORMAL HIGH (ref 8–23)
CO2: 24 mmol/L (ref 22–32)
Calcium: 8.5 mg/dL — ABNORMAL LOW (ref 8.9–10.3)
Chloride: 103 mmol/L (ref 98–111)
Creatinine, Ser: 8.46 mg/dL — ABNORMAL HIGH (ref 0.61–1.24)
GFR, Estimated: 6 mL/min — ABNORMAL LOW (ref >60)
Glucose, Bld: 92 mg/dL (ref 70–99)
Potassium: 4.1 mmol/L (ref 3.5–5.1)
Sodium: 139 mmol/L (ref 135–145)

## 2023-09-08 LAB — FERRITIN: Ferritin: 398 ng/mL — ABNORMAL HIGH (ref 24–336)

## 2023-09-08 LAB — PARATHYROID HORMONE, INTACT (NO CA): PTH: 26 pg/mL (ref 15–65)

## 2023-09-08 LAB — MAGNESIUM: Magnesium: 1.9 mg/dL (ref 1.7–2.4)

## 2023-09-08 LAB — PHOSPHORUS: Phosphorus: 4.6 mg/dL (ref 2.5–4.6)

## 2023-09-08 LAB — HEPARIN LEVEL (UNFRACTIONATED): Heparin Unfractionated: 0.4 [IU]/mL (ref 0.30–0.70)

## 2023-09-08 MED ORDER — HEPARIN (PORCINE) 25000 UT/250ML-% IV SOLN
950.0000 [IU]/h | INTRAVENOUS | Status: AC
  Administered 2023-09-08: 950 [IU]/h via INTRAVENOUS

## 2023-09-08 MED ORDER — LACTATED RINGERS IV SOLN: INTRAVENOUS | Status: DC

## 2023-09-08 NOTE — Evaluation (Signed)
Physical Therapy Evaluation Patient Details Name: Raymond Burton MRN: [id number] DOB: Jan 07, 1941 Today's Date: 09/08/2023  History of Present Illness  Raymond Burton is a 82 y/o male. Family reports gradual weakness over the past several weeks to months, over the past 3 days it has been more pronounced, with patient lying in bed.  He also reports gradual onset of poor appetite in the same time period, which is different from his baseline.  He denies any pain currently, but reports some mild chest pain a while ago- which he had not mentioned to family previously, he is unable to give further details.  Per family he has been voiding okay.  Reports some nasal congestion recently that appears to have improved.  No difficulty breathing.  No leg swelling.  No vomiting no diarrhea   Clinical Impression  Patient limited for functional mobility as stated below secondary to weakness, fatigue and impaired balance. Patient able to complete bed mobility with CGA and is somewhat impulsive with transfer to standing immediately following transition to seated EOB. Patient transfers to standing without AD with mild unsteadiness upon standing. Patient ambulates in room/hall without AD with intermittent unsteadiness, assist for balance/safety throughout. Patient returned to room at end of session. Patient will benefit from continued physical therapy in hospital and recommended venue below to increase strength, balance, endurance for safe ADLs and gait.         If plan is discharge home, recommend the following: A little help with walking and/or transfers;A little help with bathing/dressing/bathroom;Help with stairs or ramp for entrance   Can travel by private vehicle        Equipment Recommendations None recommended by PT  Recommendations for Other Services       Functional Status Assessment Patient has had a recent decline in their functional status and demonstrates the ability to make significant improvements in  function in a reasonable and predictable amount of time.     Precautions / Restrictions Precautions Precautions: Fall Precaution Comments: \ Restrictions Weight Bearing Restrictions: No      Mobility  Bed Mobility Overal bed mobility: Needs Assistance Bed Mobility: Supine to Sit     Supine to sit: Contact guard          Transfers Overall transfer level: Needs assistance Equipment used: None Transfers: Sit to/from Stand Sit to Stand: Contact guard assist           General transfer comment: Patient somewhat impulsive and transfers to standing without AD following transition to seated EOB, mild unsteadiness upon standing    Ambulation/Gait Ambulation/Gait assistance: Contact guard assist, Min assist Gait Distance (Feet): 200 Feet Assistive device: None Gait Pattern/deviations: Step-through pattern, Decreased stride length Gait velocity: decreased     General Gait Details: ambulates without AD with intermittent unsteadiness  Stairs            Wheelchair Mobility     Tilt Bed    Modified Rankin (Stroke Patients Only)       Balance Overall balance assessment: Needs assistance   Sitting balance-Leahy Scale: Good     Standing balance support: No upper extremity supported, During functional activity Standing balance-Leahy Scale: Fair Standing balance comment: good/fair without AD                             Pertinent Vitals/Pain Pain Assessment Pain Assessment: No/denies pain    Home Living Family/patient expects to be discharged to:: Private residence Living Arrangements: Spouse/significant  other Available Help at Discharge: Family;Available 24 hours/day Type of Home: House Home Access: Stairs to enter   Entrance Stairs-Number of Steps: 4 Alternate Level Stairs-Number of Steps: 14 (8 to landing in basement) Home Layout: Multi-level;Able to live on main level with bedroom/bathroom Home Equipment: Rolling Walker (2  wheels);Shower seat      Prior Function Prior Level of Function : Independent/Modified Independent             Mobility Comments: ind with basic ADL ADLs Comments: short distance community ambulation     Extremity/Trunk Assessment   Upper Extremity Assessment Upper Extremity Assessment: Defer to OT evaluation    Lower Extremity Assessment Lower Extremity Assessment: Generalized weakness    Cervical / Trunk Assessment Cervical / Trunk Assessment: Kyphotic  Communication   Communication Communication: Difficulty communicating thoughts/reduced clarity of speech  Cognition Arousal: Alert Behavior During Therapy: WFL for tasks assessed/performed Overall Cognitive Status: History of cognitive impairments - at baseline                                          General Comments      Exercises     Assessment/Plan    PT Assessment Patient needs continued PT services  PT Problem List Decreased strength;Decreased activity tolerance;Decreased balance;Decreased mobility       PT Treatment Interventions DME instruction;Neuromuscular re-education;Gait training;Stair training;Patient/family education;Functional mobility training;Therapeutic activities;Therapeutic exercise;Balance training    PT Goals (Current goals can be found in the Care Plan section)  Acute Rehab PT Goals Patient Stated Goal: return home PT Goal Formulation: With patient Time For Goal Achievement: 09/22/23 Potential to Achieve Goals: Good    Frequency Min 3X/week     Co-evaluation PT/OT/SLP Co-Evaluation/Treatment: Yes Reason for Co-Treatment: Complexity of the patient's impairments (multi-system involvement);To address functional/ADL transfers PT goals addressed during session: Mobility/safety with mobility;Balance;Strengthening/ROM OT goals addressed during session: ADL's and self-care       AM-PAC PT "6 Clicks" Mobility  Outcome Measure Help needed turning from your back to  your side while in a flat bed without using bedrails?: None Help needed moving from lying on your back to sitting on the side of a flat bed without using bedrails?: A Little Help needed moving to and from a bed to a chair (including a wheelchair)?: A Little Help needed standing up from a chair using your arms (e.g., wheelchair or bedside chair)?: A Little Help needed to walk in hospital room?: A Little Help needed climbing 3-5 steps with a railing? : A Little 6 Click Score: 19    End of Session   Activity Tolerance: Patient tolerated treatment well Patient left: in bed;with family/visitor present;with call bell/phone within reach;with bed alarm set Nurse Communication: Mobility status PT Visit Diagnosis: Unsteadiness on feet (R26.81);Other abnormalities of gait and mobility (R26.89);Muscle weakness (generalized) (M62.81)    Time: 9629-5284 PT Time Calculation (min) (ACUTE ONLY): 19 min   Charges:   PT Evaluation $PT Eval Low Complexity: 1 Low PT Treatments $Therapeutic Activity: 8-22 mins PT General Charges $$ ACUTE PT VISIT: 1 Visit         10:43 AM, 09/08/23 Wyman Songster PT, DPT Physical Therapist at [facility]

## 2023-09-08 NOTE — Evaluation (Addendum)
Occupational Therapy Evaluation Patient Details Name: Raymond Burton MRN: [id number] DOB: 09-07-1941 Today's Date: 09/08/2023   History of Present Illness Raymond Burton is a 82 y/o male. Family reports gradual weakness over the past several weeks to months, over the past 3 days it has been more pronounced, with patient lying in bed.  He also reports gradual onset of poor appetite in the same time period, which is different from his baseline.  He denies any pain currently, but reports some mild chest pain a while ago- which he had not mentioned to family previously, he is unable to give further details.  Per family he has been voiding okay.  Reports some nasal congestion recently that appears to have improved.  No difficulty breathing.  No leg swelling.  No vomiting no diarrhea   Clinical Impression   Pt agreeable to OT/PT co-evaluation. Wife present and assisting with PLOF history. Pt requiring CGA to min assist for mobility tasks. Intermittent verbal cuing for sequencing due to baseline cognition. Pt with occasional LOB during functional mobility, stable with static standing at sink. Near baseline for task completion and assistance level. Recommend continued skilled OT services during hospitalization to address safety and independence in ADLs.        If plan is discharge home, recommend the following: A little help with walking and/or transfers;A little help with bathing/dressing/bathroom;Assistance with cooking/housework;Assist for transportation    Functional Status Assessment  Patient has had a recent decline in their functional status and demonstrates the ability to make significant improvements in function in a reasonable and predictable amount of time.  Equipment Recommendations  None recommended by OT       Precautions / Restrictions Precautions Precautions: Fall Precaution Comments: \ Restrictions Weight Bearing Restrictions: No      Mobility Bed Mobility                General bed mobility comments: Defer to PT note    Transfers                   General transfer comment: Defer to PT note          ADL either performed or assessed with clinical judgement   ADL Overall ADL's : Needs assistance/impaired     Grooming: Wash/dry hands;Supervision/safety;Standing                   Toilet Transfer: Electronics engineer Details (indicate cue type and reason): simulated with functional mobility         Functional mobility during ADLs: Contact guard assist;Minimal assistance        Pertinent Vitals/Pain Pain Assessment Pain Assessment: No/denies pain     Extremity/Trunk Assessment Upper Extremity Assessment Upper Extremity Assessment: Generalized weakness   Lower Extremity Assessment Lower Extremity Assessment: Defer to PT evaluation       Communication Communication Communication: Difficulty communicating thoughts/reduced clarity of speech   Cognition Arousal: Alert Behavior During Therapy: [facility] for tasks assessed/performed Overall Cognitive Status: History of cognitive impairments - at baseline                                                  Home Living Family/patient expects to be discharged to:: Private residence Living Arrangements: Spouse/significant other Available Help at Discharge: Family;Available 24 hours/day Type of Home: House Home Access: Stairs to  enter Entrance Stairs-Number of Steps: 4   Home Layout: Multi-level;Able to live on main level with bedroom/bathroom Alternate Level Stairs-Number of Steps: 14 (8 to landing in basement) Alternate Level Stairs-Rails: Right;Left     Bathroom Toilet: Handicapped height     Home Equipment: Agricultural consultant (2 wheels);Shower seat          Prior Functioning/Environment Prior Level of Function : Independent/Modified Independent             Mobility Comments: ind with basic ADL ADLs Comments: short  distance community ambulation        OT Problem List: Decreased strength;Decreased activity tolerance;Impaired balance (sitting and/or standing)      OT Treatment/Interventions: Self-care/ADL training;Therapeutic exercise;Neuromuscular education;DME and/or AE instruction;Therapeutic activities;Patient/family education    OT Goals(Current goals can be found in the care plan section) Acute Rehab OT Goals Patient Stated Goal: To be released OT Goal Formulation: With patient Time For Goal Achievement: 09/22/23 Potential to Achieve Goals: Good ADL Goals Pt Will Perform Grooming: with supervision;standing Pt Will Perform Upper Body Dressing: with supervision;sitting;standing Pt Will Perform Lower Body Dressing: with supervision;sitting/lateral leans;sit to/from stand Pt Will Transfer to Toilet: with supervision;ambulating;regular height toilet;bedside commode Pt Will Perform Toileting - Clothing Manipulation and hygiene: with supervision;sitting/lateral leans;sit to/from stand Pt/caregiver will Perform Home Exercise Program: Increased strength;Both right and left upper extremity;With Supervision;With written HEP provided  OT Frequency: Min 1X/week    Co-evaluation PT/OT/SLP Co-Evaluation/Treatment: Yes Reason for Co-Treatment: Complexity of the patient's impairments (multi-system involvement)   OT goals addressed during session: ADL's and self-care      AM-PAC OT "6 Clicks" Daily Activity     Outcome Measure Help from another person eating meals?: None Help from another person taking care of personal grooming?: A Little Help from another person toileting, which includes using toliet, bedpan, or urinal?: A Little Help from another person bathing (including washing, rinsing, drying)?: A Little Help from another person to put on and taking off regular upper body clothing?: A Little Help from another person to put on and taking off regular lower body clothing?: A Little 6 Click Score:  19   End of Session Equipment Utilized During Treatment: Gait belt  Activity Tolerance: Patient tolerated treatment well Patient left: in bed;with call bell/phone within reach;with bed alarm set;with family/visitor present  OT Visit Diagnosis: Muscle weakness (generalized) (M62.81)                Time: 3086-5784 OT Time Calculation (min): 19 min Charges:  OT General Charges $OT Visit: 1 Visit OT Evaluation $OT Eval Low Complexity: 1 Low  Ezra Sites, OTR/L  [phone] 09/08/2023, 9:39 AM

## 2023-09-08 NOTE — Plan of Care (Signed)
  Problem: Acute Rehab PT Goals(only PT should resolve) Goal: Patient Will Transfer Sit To/From Stand Outcome: Progressing Flowsheets (Taken 09/08/2023 1044) Patient will transfer sit to/from stand: with supervision Goal: Pt Will Transfer Bed To Chair/Chair To Bed Outcome: Progressing Flowsheets (Taken 09/08/2023 1044) Pt will Transfer Bed to Chair/Chair to Bed: with supervision Goal: Pt Will Ambulate Outcome: Progressing Flowsheets (Taken 09/08/2023 1044) Pt will Ambulate:  > 125 feet  with modified independence  with supervision  with least restrictive assistive device Goal: Pt/caregiver will Perform Home Exercise Program Outcome: Progressing Flowsheets (Taken 09/08/2023 1044) Pt/caregiver will Perform Home Exercise Program:  For increased strengthening  For improved balance  Independently  10:45 AM, 09/08/23 Wyman Songster PT, DPT Physical Therapist at [facility]

## 2023-09-08 NOTE — Progress Notes (Signed)
[facility] Progress Note  Name: Raymond Burton MRN: [id number] DOB: October 23, 1940  Chief Complaint:  Fatigue and weakness  Subjective:  He had 2.9 liters UOP over 11/28 charted.  He had LR at 100 ml/hr x 12 hours.  Note that he has been on heparin gtt.  Note that he has been NPO since midnight in the event a procedure was needed.  Consulting nephrologist spoke with his niece yesterday - she is a nephrologist at [facility].   I spoke with the patient, his wife at bedside, and his niece via speakerphone today during rounds.  He had  a foley placed after arrival and though he denies difficulty urinating his wife states that he has sometimes had some trouble.  He is on flomax at home.  The patient's wife brought him in because he just didn't have any energy.  He is very active and over the past 6 months or so his energy level has gone down and he hasn't always been able to do the things that he has wanted to do.  They state that he does have some memory impairment related to his Parkinson's.  His niece was very supportive of any future decisions that her aunt and uncle may make regarding dialysis in the future.  If they do decide to pursue dialysis she felt that he would be a great candidate for urgent start PD rather than hemo (available at [facility] vs here if can be arranged).    Review of systems:  He denies any current shortness of breath  He wound put his hands on his chest a couple of days ago and normally isn't one to complain about anything.  No chest pain today He denies any nausea or vomiting   ----------------- Background on consult:  Raymond Burton is a 82 y.o. male with past medical history significant for hypertension, prediabetes, HLD, Parkinson's dementia, TIA who was presented to the hospital on 11/27 because of worsening fatigue and generalized weakness, seen as a consultation for the evaluation of acute kidney injury.  Reportedly, the patient has been feeling weak for  the last several months with poor appetite associated with generalized weakness.  Yesterday morning he did not wake up at his usual time and was not feeling well associated with some chest discomfort.  In the ER, he was on room air.  Blood pressure acceptable and afebrile.  The labs showed creatinine level of 7.82, BUN 73, potassium 4.1, troponin 458, hemoglobin 11.9.  The urinalysis with protein 30, no RBC.  Chest x-ray with no acute finding.  The kidney ultrasound showed right kidney 13.4 cm, left kidney 12 cm with diffusely echogenic renal parenchyma and a complex cyst which require outpatient follow-up.  No hydronephrosis.  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  The patient was treated with IV fluid, IV heparin.  Cardiology consult was obtained and receiving echocardiogram.  Troponinemia thought to be due to low GFR.  The baseline creatinine level unknown however H&P reported that the creatinine level was around 1.27 back in 2018.  No recent lab results available to review even in Care Everywhere.  Urine output is recorded around 900 cc.  Currently patient is alert awake with some confusion.  He is getting echocardiogram. Consulting nephrologist spoke with the patient's niece Dr. Ida Rogue who is a nephrologist at [facility].  Respiratory viral panel negative   Intake/Output Summary (Last 24 hours) at 09/08/2023 0940 Last data filed  at 09/08/2023 0927 Gross per 24 hour  Intake 1317.94 ml  Output 2850 ml  Net -1532.06 ml    Vitals:  Vitals:   09/07/23 0300 09/07/23 1230 09/07/23 2017 09/08/23 0451  BP: 120/73 123/67 136/77 124/73  Pulse: 62 (!) 58 69 69  Resp: 18 18 18 18   Temp: 98.2 F (36.8 C) 98.8 F (37.1 C) 97.7 F (36.5 C) 98.8 F (37.1 C)  TempSrc: Oral  Oral Oral  SpO2: 98% 97% 93% 98%  Weight:      Height:         Physical Exam:  General elderly male in bed in no acute distress HEENT normocephalic atraumatic  extraocular movements intact sclera anicteric Neck supple trachea midline Lungs clear to auscultation bilaterally normal work of breathing at rest on room air Heart S1S2 no rub Abdomen soft nontender nondistended Extremities no edema  Psych normal mood and affect GU foley in place    Medications reviewed   Labs:     Latest Ref Rng & Units 09/08/2023    4:11 AM 09/07/2023    4:08 AM 09/06/2023    3:21 PM  BMP  Glucose 70 - 99 mg/dL 92  88  387   BUN 8 - 23 mg/dL 69  72  73   Creatinine 0.61 - 1.24 mg/dL 5.64  3.32  9.51   Sodium 135 - 145 mmol/L 139  141  141   Potassium 3.5 - 5.1 mmol/L 4.1  3.9  4.1   Chloride 98 - 111 mmol/L 103  104  103   CO2 22 - 32 mmol/L 24  24  27    Calcium 8.9 - 10.3 mg/dL 8.5  8.7  9.3      Assessment/Plan:    # Acute kidney injury, nonoliguric - Felt likely secondary to pre-renal insults in the setting of poor oral intake/dehydration (use of telmisartan/hydrochlorothiazide) as well as potential outlet obstruction versus progressive CKD.  Note that his post-void residual was charted as zero.  No recent baseline creatinine level available to compare however he presented with creatinine level around 8 with symptoms of uremia.  UA with minimal protein, no hematuria.  Kidney ultrasound showed echogenic parenchyma consistent with CKD however no hydronephrosis.  UA 30 mg/dL and 0-5 RBC.  Up/cr ratio 1590 mg/g. Resp viral panel negative.  Hep B and C and HIV were all non-reactive ------------------ - We have sent Serologic work-up: some results as above and ANA, C3, C4, SPEP, and serum free light chains pending  - Will also send ANCA  - Continue foley catheter and flomax - No emergent indication for dialysis.  Possible need for starting dialysis however family is processing this and note also patient with history of Parkinson's dementia per charting so unsure of candidacy for long-term dialysis.  Note also that per charting the family was interested in PD.   Baseline functional status is good.  Note that as above his niece was very supportive of any decisions that her aunt and uncle may make regarding dialysis in the future.  If they do decide to pursue dialysis she felt that they would be a great candidate for urgent start PD rather than hemo (available at [facility] vs here if can be arranged). - Nonoliguric - resume IV fluids.  Set LR at 125 ml/hr x 24 hours and then reassess tomorrow.  Would need new fluid order.  This gentleman does need IV fluids despite the current shortage  - Hold ARB/diuretics - I have ordered strict  ins/outs - Note he is NPO and on heparin gtt.  He can eat from a strictly renal standpoint and I have let the primary team know this    # Proteinuria - subnephrotic - See work-up for AKI as above - No RAAS blockade for now given unknown chronicity   # Elevated troponin/chest pain:  - TTE with severe LVH and LVEF 60-65%; left atrial size severely dilated  - Cardiology was consulted   - Currently on IV heparin which is per primary team     # HTN:  - Blood pressure acceptable.   - Currently on amlodipine.  - ARB on hold because of AKI.   # CKD-MBD:  - PTH is pending.  Add on phosphorus level and check in AM. Calcium acceptable   # Anemia of CKD:  - No acute indication for ESA - add on iron panel    # Memory impairment/Parkinson disease: On Sinemet. Per primary team.  Will need to thoughtfully consider his candidacy for dialysis depending on his baseline however note that his baseline appears to be relatively good    Thank you for the consult.  Please do not hesitate to contact our team if we can be of any assistance.  We will review his chart and labs and fluid status over the weekend and are able to see him if clinically indicated   Disposition - continue inpatient monitoring    Estanislado Emms, MD 09/08/2023 10:54 AM

## 2023-09-08 NOTE — Plan of Care (Signed)
  Problem: Acute Rehab OT Goals (only OT should resolve) Goal: Pt. Will Perform Grooming Flowsheets (Taken 09/08/2023 0936) Pt Will Perform Grooming:  with supervision  standing Goal: Pt. Will Perform Upper Body Dressing Flowsheets (Taken 09/08/2023 0936) Pt Will Perform Upper Body Dressing:  with supervision  sitting  standing Goal: Pt. Will Perform Lower Body Dressing Flowsheets (Taken 09/08/2023 0936) Pt Will Perform Lower Body Dressing:  with supervision  sitting/lateral leans  sit to/from stand Goal: Pt. Will Transfer To Toilet Flowsheets (Taken 09/08/2023 [phone]) Pt Will Transfer to Toilet:  with supervision  ambulating  regular height toilet  bedside commode Goal: Pt. Will Perform Toileting-Clothing Manipulation Flowsheets (Taken 09/08/2023 0936) Pt Will Perform Toileting - Clothing Manipulation and hygiene:  with supervision  sitting/lateral leans  sit to/from stand Goal: Pt/Caregiver Will Perform Home Exercise Program Flowsheets (Taken 09/08/2023 [phone]) Pt/caregiver will Perform Home Exercise Program:  Increased strength  Both right and left upper extremity  With Supervision  With written HEP provided

## 2023-09-08 NOTE — Progress Notes (Signed)
PHARMACY - ANTICOAGULATION CONSULT NOTE  Pharmacy Consult for Heparin Infusion Indication: chest pain/ACS  No Known Allergies  Patient Measurements: Height: 5\' 9"  (175.3 cm) Weight: 77.3 kg (170 lb 6.7 oz) IBW/kg (Calculated) : 70.7 Heparin Dosing Weight: 79.4 kg  Vital Signs: Temp: 98.8 F (37.1 C) (11/29 0451) Temp Source: Oral (11/29 0451) BP: 124/73 (11/29 0451) Pulse Rate: 69 (11/29 0451)  Labs: Recent Labs    09/06/23 1521 09/06/23 1757 09/07/23 0408 09/07/23 1139 09/08/23 0411  HGB 11.9*  --   --   --  10.2*  HCT 35.8*  --   --   --  29.9*  PLT 197  --   --   --  193  APTT 30  --   --   --   --   LABPROT 14.3  --   --   --   --   INR 1.1  --   --   --   --   HEPARINUNFRC  --   --  0.35 0.34 0.40  CREATININE 7.82*  --  8.02*  --  8.46*  TROPONINIHS 458* 475* 424*  --   --     Estimated Creatinine Clearance: 6.8 mL/min (A) (by C-G formula based on SCr of 8.46 mg/dL (H)).   Medical History: Past Medical History:  Diagnosis Date   Bradycardia    Diabetes mellitus without complication (HCC)    Hyperlipidemia    Hypertension    NUCLEAR STRESS TEST, 07/17/2007 - EKG negative ischemia   Memory loss    Murmur    2D ECHO, 02/04/2009 - EF 55-65%, normal   TIA (transient ischemic attack)     Assessment: Patient is a 82 year old male with a past medical history of Parkinson's disease, cognitive impairment, and anhedonia. Patient presented to ED with generalized weakness and was shown to have an elevated Troponin level of 458. Pharmacy was consulted to initiate patient on a heparin infusion. Per chart review, patient is not on anticoagulation prior to admission.  HL 0.4- therapeutic Hgb 10.2, plt 193 No bleeding or IV issues noted.    Goal of Therapy:  Heparin level 0.3-0.7 units/ml Monitor platelets by anticoagulation protocol: Yes   Plan:  Cont heparin 950 units/hr Daily heparin level  Sheppard Coil PharmD., BCPS Clinical Pharmacist 09/08/2023 9:40  AM

## 2023-09-08 NOTE — Progress Notes (Signed)
PROGRESS NOTE    Raymond Burton  ZOX:[id number] DOB: 09-20-1941 DOA: 09/06/2023 PCP: Renaye Rakers, MD   Brief Narrative:    Raymond Burton is a 82 y.o. male with medical history significant for bradycardia, hypertension, prediabetes, Parkinson's, TIA. Patient was brought to the ED by family with complaints of generalized weakness. 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 Family reports gradual weakness over the past several weeks to months, over the past 3 days it has been more pronounced, with patient lying in bed.  He also reports gradual onset of poor appetite in the same time period, which is different from his baseline.  He has been admitted for AKI versus progression of CKD as well as elevated troponin levels.  Nephrology has ordered further serologies and has continued IV fluid for now with plans to recheck in a couple days to see what family has decided regarding initiation of dialysis.  Assessment & Plan:   Principal Problem:   AKI (acute kidney injury) (HCC) Active Problems:   Elevated troponin   Essential hypertension   Bradycardia   Pre-diabetes   Parkinson disease (HCC)  Assessment and Plan:    AKI (acute kidney injury) (HCC) versus progression of CKD; nonoliguiric Creatinine markedly elevated at 7.82.  Last checked per chart 2018 creatinine was 1.27 with GFR of 64.  Unknown if baseline CKD, acuity of renal dysfunction.  Family reports chronic poor oral intake, he is also on telmisartan HCTZ.  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  -UA with small hemoglobin, 30 protein - continue LR 125 mL/h x 24 hours -Renal ultrasound with no signs of hydronephrosis -Insert Foley -Appreciate ongoing nephrology evaluation -Hold HCTZ telmisartan (25/80)   Elevated troponin Troponin 458 > 475.  Reports some chest pain previously but is unable to tell when I will give details.  Currently chest pain-free.  EKG  without ST or T wave changes. - EDP talked to cardiology, does not feel that this is very concerning with normal EKG in the setting of significant renal dysfunction, should be okay for patient to stay at [facility].  -Echocardiogram with no wall motion abnormalities and preserved LVEF -Discontinue IV heparin this evening after total 48 hours -Okay for diet   Parkinson disease (HCC) Follows with neurology.  Baseline dementia, at baseline he can answer simple questions, but on bad days he is barely able to communicate. -Resume Sinemet     Pre-diabetes - Hgba1c 6.0%   Bradycardia Sinus bradycardia by EKG.  Heart rate 49-63.  History of bradycardia.  Not on rate limiting medications. -TSH normal at 2.05   Essential hypertension Stable. -Currently on Norvasc  Worsening anemia of CKD No indication for ESA Iron panel ordered per nephrology    DVT prophylaxis:Heparin drip Code Status: Full Family Communication: Wife present at bedside 11/29 Disposition Plan: Await further IV fluid and decision on hemodialysis Status is: Inpatient Remains inpatient appropriate because: Need for IV fluid.   Consultants:  Nephrology  Procedures:  None  Antimicrobials:  None   Subjective: Patient seen and evaluated today with no new acute complaints or concerns. No acute concerns or events noted overnight.  He continues to feel weak and have poor appetite with mild nausea on occasion.  Objective: Vitals:   09/07/23 0300 09/07/23 1230 09/07/23 2017 09/08/23 0451  BP: 120/73 123/67 136/77 124/73  Pulse: 62 (!) 58 69 69  Resp: 18 18 18 18   Temp: 98.2 F (36.8  C) 98.8 F (37.1 C) 97.7 F (36.5 C) 98.8 F (37.1 C)  TempSrc: Oral  Oral Oral  SpO2: 98% 97% 93% 98%  Weight:      Height:        Intake/Output Summary (Last 24 hours) at 09/08/2023 1117 Last data filed at 09/08/2023 1610 Gross per 24 hour  Intake 1317.94 ml  Output 2850 ml  Net -1532.06 ml   Filed Weights   09/06/23  1402 09/06/23 2013  Weight: 79.4 kg 77.3 kg    Examination:  General exam: Appears calm and comfortable  Respiratory system: Clear to auscultation. Respiratory effort normal. Cardiovascular system: S1 & S2 heard, RRR.  Gastrointestinal system: Abdomen is soft Central nervous system: Alert and awake Extremities: No edema Skin: No significant lesions noted Psychiatry: Flat affect.    Data Reviewed: I have personally reviewed following labs and imaging studies  CBC: Recent Labs  Lab 09/06/23 1521 09/08/23 0411  WBC 6.0 5.4  HGB 11.9* 10.2*  HCT 35.8* 29.9*  MCV 100.8* 98.7  PLT 197 193   Basic Metabolic Panel: Recent Labs  Lab 09/06/23 1521 09/07/23 0408 09/08/23 0411 09/08/23 1000  NA 141 141 139  --   K 4.1 3.9 4.1  --   CL 103 104 103  --   CO2 27 24 24   --   GLUCOSE 107* 88 92  --   BUN 73* 72* 69*  --   CREATININE 7.82* 8.02* 8.46*  --   CALCIUM 9.3 8.7* 8.5*  --   MG  --   --  1.9  --   PHOS  --   --   --  4.6   GFR: Estimated Creatinine Clearance: 6.8 mL/min (A) (by C-G formula based on SCr of 8.46 mg/dL (H)). Liver Function Tests: Recent Labs  Lab 09/06/23 1521  AST 26  ALT 8  ALKPHOS 44  BILITOT 0.9  PROT 7.2  ALBUMIN 3.6   No results for input(s): "LIPASE", "AMYLASE" in the last 168 hours. No results for input(s): "AMMONIA" in the last 168 hours. Coagulation Profile: Recent Labs  Lab 09/06/23 1521  INR 1.1   Cardiac Enzymes: No results for input(s): "CKTOTAL", "CKMB", "CKMBINDEX", "TROPONINI" in the last 168 hours. BNP (last 3 results) No results for input(s): "PROBNP" in the last 8760 hours. HbA1C: Recent Labs    09/06/23 1521  HGBA1C 6.0*   CBG: No results for input(s): "GLUCAP" in the last 168 hours. Lipid Profile: No results for input(s): "CHOL", "HDL", "LDLCALC", "TRIG", "CHOLHDL", "LDLDIRECT" in the last 72 hours. Thyroid Function Tests: Recent Labs    09/06/23 1521  TSH 2.050  FREET4 0.88   Anemia Panel: Recent  Labs    09/08/23 1001  FERRITIN 398*  TIBC 150*  IRON 80   Sepsis Labs: No results for input(s): "PROCALCITON", "LATICACIDVEN" in the last 168 hours.  Recent Results (from the past 240 hour(s))  Resp panel by RT-PCR (RSV, Flu A&B, Covid) Anterior Nasal Swab     Status: None   Collection Time: 09/06/23  3:21 PM   Specimen: Anterior Nasal Swab  Result Value Ref Range Status   SARS Coronavirus 2 by RT PCR NEGATIVE NEGATIVE Final    Comment: (NOTE) SARS-CoV-2 target nucleic acids are NOT DETECTED.  The SARS-CoV-2 RNA is generally detectable in upper respiratory specimens during the acute phase of infection. The lowest concentration of SARS-CoV-2 viral copies this assay can detect is 138 copies/mL. A negative result does not preclude SARS-Cov-2 infection and should  not be used as the sole basis for treatment or other patient management decisions. A negative result may occur with  improper specimen collection/handling, submission of specimen other than nasopharyngeal swab, presence of viral mutation(s) within the areas targeted by this assay, and inadequate number of viral copies(<138 copies/mL). A negative result must be combined with clinical observations, patient history, and epidemiological information. The expected result is Negative.  Fact Sheet for Patients:  BloggerCourse.com  Fact Sheet for Healthcare Providers:  SeriousBroker.it  This test is no t yet approved or cleared by the [place] FDA and  has been authorized for detection and/or diagnosis of SARS-CoV-2 by FDA under an Emergency Use Authorization (EUA). This EUA will remain  in effect (meaning this test can be used) for the duration of the COVID-19 declaration under Section 564(b)(1) of the Act, 21 U.S.C.section 360bbb-3(b)(1), unless the authorization is terminated  or revoked sooner.       Influenza A by PCR NEGATIVE NEGATIVE Final   Influenza B by PCR  NEGATIVE NEGATIVE Final    Comment: (NOTE) The Xpert Xpress SARS-CoV-2/FLU/RSV plus assay is intended as an aid in the diagnosis of influenza from Nasopharyngeal swab specimens and should not be used as a sole basis for treatment. Nasal washings and aspirates are unacceptable for Xpert Xpress SARS-CoV-2/FLU/RSV testing.  Fact Sheet for Patients: BloggerCourse.com  Fact Sheet for Healthcare Providers: SeriousBroker.it  This test is not yet approved or cleared by the [place] FDA and has been authorized for detection and/or diagnosis of SARS-CoV-2 by FDA under an Emergency Use Authorization (EUA). This EUA will remain in effect (meaning this test can be used) for the duration of the COVID-19 declaration under Section 564(b)(1) of the Act, 21 U.S.C. section 360bbb-3(b)(1), unless the authorization is terminated or revoked.     Resp Syncytial Virus by PCR NEGATIVE NEGATIVE Final    Comment: (NOTE) Fact Sheet for Patients: BloggerCourse.com  Fact Sheet for Healthcare Providers: SeriousBroker.it  This test is not yet approved or cleared by the [place] FDA and has been authorized for detection and/or diagnosis of SARS-CoV-2 by FDA under an Emergency Use Authorization (EUA). This EUA will remain in effect (meaning this test can be used) for the duration of the COVID-19 declaration under Section 564(b)(1) of the Act, 21 U.S.C. section 360bbb-3(b)(1), unless the authorization is terminated or revoked.  Performed at [facility], [address] [place], [place] [address]   Surgical PCR screen     Status: None   Collection Time: 09/07/23 10:45 PM   Specimen: Nasal Mucosa; Nasal Swab  Result Value Ref Range Status   MRSA, PCR NEGATIVE NEGATIVE Final   Staphylococcus aureus NEGATIVE NEGATIVE Final    Comment: (NOTE) The Xpert SA Assay (FDA approved for NASAL specimens in  patients 37 years of age and older), is one component of a comprehensive surveillance program. It is not intended to diagnose infection nor to guide or monitor treatment. Performed at [facility], [address], [place], [place] [address]          Radiology Studies: ECHOCARDIOGRAM COMPLETE  Result Date: 09/07/2023    ECHOCARDIOGRAM REPORT   Patient Name:   TRYON SWIGGETT Date of Exam: 09/07/2023 Medical Rec #:  [id number]     Height:       69.0 in Accession #:    [id number]    Weight:       170.4 lb Date of Birth:  Oct 24, 1940    BSA:  1.930 m Patient Age:    82 years      BP:           120/73 mmHg Patient Gender: M             HR:           75 bpm. [facility] Procedure: 2D Echo, Color Doppler and Cardiac Doppler Indications:    elevated troponin  History:        Patient has prior history of Echocardiogram examinations, most                 recent 02/04/2009. Parkinson's disease, Signs/Symptoms:Murmur;                 Risk Factors:Hypertension and Dyslipidemia.  Sonographer:    Delcie Roch RDCS Referring Phys: [id number] Heloise Beecham EMOKPAE IMPRESSIONS  1. Left ventricular ejection fraction, by estimation, is 60 to 65%. The left ventricle has normal function. The left ventricle has no regional wall motion abnormalities. 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 Left  ventricular diastolic parameters were normal.  2. Right ventricular systolic function is normal. The right ventricular size is normal. There is normal pulmonary artery systolic pressure.  3. Left atrial size was severely dilated.  4. The mitral valve is normal in structure. Trivial mitral valve regurgitation. No evidence of mitral stenosis.  5. The aortic valve is tricuspid. Aortic valve regurgitation is trivial. No aortic stenosis is present.  6. Aortic dilatation noted. There is borderline dilatation of the ascending aorta, measuring 38 mm.  7. The inferior vena cava is normal  in size with greater than 50% respiratory variability, suggesting right atrial pressure of 3 mmHg. Comparison(s): No prior Echocardiogram. FINDINGS  Left Ventricle: Left ventricular ejection fraction, by estimation, is 60 to 65%. The left ventricle has normal function. The left ventricle has no regional wall motion abnormalities. The left ventricular internal cavity size was normal in size. 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 Left ventricular diastolic parameters were normal. Normal left ventricular filling pressure. Right Ventricle: The right ventricular size is normal. No increase in right ventricular wall thickness. Right ventricular systolic function is normal. There is normal pulmonary artery systolic pressure. The tricuspid regurgitant velocity is 2.57 m/s, and  with an assumed right atrial pressure of 3 mmHg, the estimated right ventricular systolic pressure is 29.4 mmHg. Left Atrium: Left atrial size was severely dilated. Right Atrium: Right atrial size was normal in size. Pericardium: There is no evidence of pericardial effusion. Mitral Valve: The mitral valve is normal in structure. Trivial mitral valve regurgitation. No evidence of mitral valve stenosis. Tricuspid Valve: The tricuspid valve is normal in structure. Tricuspid valve regurgitation is trivial. No evidence of tricuspid stenosis. Aortic Valve: The aortic valve is tricuspid. Aortic valve regurgitation is trivial. No aortic stenosis is present. Pulmonic Valve: The pulmonic valve was normal in structure. Pulmonic valve regurgitation is mild. No evidence of pulmonic stenosis. Aorta: The aortic root is normal in size and structure and aortic dilatation noted. There is borderline dilatation of the ascending aorta, measuring 38 mm. Venous: The inferior vena cava is normal in size with greater than 50% respiratory variability, suggesting right atrial pressure of 3 mmHg. IAS/Shunts: No atrial level shunt  detected by color flow Doppler.  LEFT VENTRICLE PLAX 2D LVIDd:         4.30 cm   Diastology LVIDs:  2.50 cm   LV e' medial:    4.57 cm/s LV PW:         1.40 cm   LV E/e' medial:  17.9 LV IVS:        1.50 cm   LV e' lateral:   7.07 cm/s LVOT diam:     2.20 cm   LV E/e' lateral: 11.6 LV SV:         79 LV SV Index:   41 LVOT Area:     3.80 cm  RIGHT VENTRICLE             IVC RV Basal diam:  3.10 cm     IVC diam: 1.80 cm RV S prime:     11.40 cm/s TAPSE (M-mode): 1.9 cm LEFT ATRIUM              Index        RIGHT ATRIUM           Index LA diam:        4.20 cm  2.18 cm/m   RA Area:     16.40 cm LA Vol (A2C):   111.0 ml 57.51 ml/m  RA Volume:   43.30 ml  22.43 ml/m LA Vol (A4C):   106.0 ml 54.92 ml/m LA Biplane Vol: 108.0 ml 55.96 ml/m  AORTIC VALVE             PULMONIC VALVE LVOT Vmax:   99.80 cm/s  PR End Diast Vel: 11.02 msec LVOT Vmean:  67.300 cm/s LVOT VTI:    0.207 m  AORTA Ao Root diam: 3.20 cm Ao Asc diam:  3.80 cm MITRAL VALVE               TRICUSPID VALVE MV Area (PHT): 4.36 cm    TR Peak grad:   26.4 mmHg MV Decel Time: 174 msec    TR Vmax:        257.00 cm/s MV E velocity: 81.80 cm/s MV A velocity: 65.10 cm/s  SHUNTS MV E/A ratio:  1.26        Systemic VTI:  0.21 m                            Systemic Diam: 2.20 cm Vishnu Priya Mallipeddi Electronically signed by Winfield Rast Mallipeddi Signature Date/Time: 09/07/2023/3:23:32 PM    Final    US Renal  Result Date: 09/07/2023 CLINICAL DATA:  82 year old male with history of renal failure. EXAM: RENAL / URINARY TRACT ULTRASOUND COMPLETE COMPARISON:  No priors. FINDINGS: Right Kidney: Renal measurements: 13.4 x 5.5 x 7.5 cm = volume: 298 mL. Diffusely increased parenchymal echogenicity. In the upper pole of the right kidney there is an 8.0 x 6.1 x 6.8 cm anechoic lesion with thick internal septation (up to 7 mm in thickness), but no definite internal blood flow. No hydronephrosis visualized. Left Kidney: Renal measurements: 12.0 x 7.0 x 5.3 cm  = volume: 232 mL. Diffusely increased parenchymal echogenicity. No mass or hydronephrosis visualized. Bladder: Urinary bladder is largely decompressed around an indwelling Foley balloon catheter. Other: Incidental imaging of the right lobe of the liver demonstrates a well-circumscribed 1.6 x 1.5 cm echogenic lesion. IMPRESSION: 1. No hydronephrosis. 2. Diffusely echogenic renal parenchyma indicative of medical renal disease. 3. Complex cyst in the upper pole of the right kidney, likely benign, but attention on follow-up nonemergent MRI of the abdomen with and without IV gadolinium is recommended for definitive characterization.  4. Indeterminate echogenic lesion in the right lobe of the liver. Attention at time of forthcoming MRI is also recommended. This may simply represent a cavernous hemangioma, however, the possibility of underlying malignancy is not excluded. Electronically Signed   By: Trudie Reed M.D.   On: 09/07/2023 09:49   DG Chest 2 View  Result Date: 09/06/2023 CLINICAL DATA:  Shortness of breath.  Generalized weakness. EXAM: CHEST - 2 VIEW COMPARISON:  None Available. FINDINGS: Bilateral lung fields are clear. Bilateral costophrenic angles are clear. Normal cardio-mediastinal silhouette. No acute osseous abnormalities. The soft tissues are within normal limits. IMPRESSION: No active cardiopulmonary disease. Electronically Signed   By: Jules Schick M.D.   On: 09/06/2023 16:00        Scheduled Meds:  amLODipine  10 mg Oral Daily   aspirin EC  81 mg Oral Daily   atorvastatin  40 mg Oral Daily   Chlorhexidine Gluconate Cloth  6 each Topical Daily   tamsulosin  0.4 mg Oral Daily   Continuous Infusions:  heparin 950 Units/hr (09/08/23 1048)   lactated ringers       LOS: 2 days    Time spent: 55 minutes    Shakoya Gilmore D Sherryll Burger, DO [facility]  If 7PM-7AM, please contact night-coverage [email or web address] 09/08/2023, 11:17 AM

## 2023-09-08 NOTE — TOC Initial Note (Addendum)
Transition of Care [facility]) - Initial/Assessment Note    Patient Details  Name: Raymond Burton MRN: [id number] Date of Birth: 06/09/1941  [facility]) CM/SW Contact:    Isabella Bowens, LCSWA Phone Number: 09/08/2023, 1:39 PM  Clinical Narrative:                 Patient was admitted for Acute Kidney Injury. Patient lives home with spouse and son. Spouse shared that patient is pretty much independent at home and they have a 3-N-1 and some walkers at home. Spouse will provide transportation once patient is ready to be DC. Spouse was agreeable to have [facility] come out to the home for PT since this is patient first time using home health. Cory with [facility] was contacted. TOC will continue to follow.   Expected Discharge Plan: Home w Home Health Services Barriers to Discharge: Continued Medical Work up   Patient Goals and CMS Choice Patient states their goals for this hospitalization and ongoing recovery are:: return home     Expected Discharge Plan and Services     Post Acute Care Choice: Home Health Living arrangements for the past 2 months: Single Family Home                   HH Arranged: PT HH Agency: [facility] Date [facility] Agency Contacted: 09/08/23 Time HH Agency Contacted: 1337 Representative spoke with at [facility] Agency: Spoke with Kandee Keen  Prior Living Arrangements/Services Living arrangements for the past 2 months: Single Family Home Lives with:: Adult Children, Spouse Patient language and need for interpreter reviewed:: Yes Do you feel safe going back to the place where you live?: Yes      Need for Family Participation in Patient Care: Yes (Comment)   Current home services: Home PT Criminal Activity/Legal Involvement Pertinent to Current Situation/Hospitalization: No - Comment as needed  Activities of Daily Living   ADL Screening (condition at time of admission) Independently performs ADLs?: Yes (appropriate for developmental age) Is the patient deaf  or have difficulty hearing?: No Does the patient have difficulty seeing, even when wearing glasses/contacts?: No Does the patient have difficulty concentrating, remembering, or making decisions?: No  Permission Sought/Granted      Share Information with NAME: Earley Abide     Permission granted to share info w Relationship: Spouse     Emotional Assessment       Orientation: : Oriented to Self Alcohol / Substance Use: Not Applicable Psych Involvement: No (comment)  Admission diagnosis:  Generalized weakness [R53.1] AKI (acute kidney injury) (HCC) [N17.9] Patient Active Problem List   Diagnosis Date Noted   AKI (acute kidney injury) (HCC) 09/06/2023   Elevated troponin 09/06/2023   Pre-diabetes 09/06/2023   Parkinson disease (HCC) 09/06/2023   Leg edema, left 06/29/2016   Murmur 04/21/2014   Essential hypertension 03/29/2013   Bradycardia 03/29/2013   Dyslipidemia 03/29/2013   PCP:  Renaye Rakers, MD Pharmacy:   [facility] - [place], [place] - [address] [place] [address] Phone: [phone] Fax: [phone]     Social Determinants of Health (SDOH) Social History: SDOH Screenings   Food Insecurity: No Food Insecurity (09/06/2023)  Housing: Low Risk  (09/06/2023)  Transportation Needs: No Transportation Needs (09/06/2023)  Utilities: Not At Risk (09/06/2023)  Tobacco Use: Low Risk  (09/06/2023)   SDOH Interventions:     Readmission Risk Interventions    09/08/2023    1:35 PM  Readmission Risk Prevention  Plan  Transportation Screening Complete  Home Care Screening Complete  Medication Review (RN CM) Complete

## 2023-09-09 DIAGNOSIS — N179 Acute kidney failure, unspecified: Secondary | ICD-10-CM | POA: Diagnosis not present

## 2023-09-09 LAB — CBC
HCT: 30.2 % — ABNORMAL LOW (ref 39.0–52.0)
Hemoglobin: 10 g/dL — ABNORMAL LOW (ref 13.0–17.0)
MCH: 32.7 pg (ref 26.0–34.0)
MCHC: 33.1 g/dL (ref 30.0–36.0)
MCV: 98.7 fL (ref 80.0–100.0)
Platelets: 185 10*3/uL (ref 150–400)
RBC: 3.06 MIL/uL — ABNORMAL LOW (ref 4.22–5.81)
RDW: 12.5 % (ref 11.5–15.5)
WBC: 5 10*3/uL (ref 4.0–10.5)
nRBC: 0 % (ref 0.0–0.2)

## 2023-09-09 LAB — C3 COMPLEMENT: C3 Complement: 113 mg/dL (ref 82–167)

## 2023-09-09 LAB — RENAL FUNCTION PANEL
Albumin: 2.7 g/dL — ABNORMAL LOW (ref 3.5–5.0)
Anion gap: 11 (ref 5–15)
BUN: 71 mg/dL — ABNORMAL HIGH (ref 8–23)
CO2: 23 mmol/L (ref 22–32)
Calcium: 8.4 mg/dL — ABNORMAL LOW (ref 8.9–10.3)
Chloride: 103 mmol/L (ref 98–111)
Creatinine, Ser: 8.5 mg/dL — ABNORMAL HIGH (ref 0.61–1.24)
GFR, Estimated: 6 mL/min — ABNORMAL LOW (ref >60)
Glucose, Bld: 89 mg/dL (ref 70–99)
Phosphorus: 4.9 mg/dL — ABNORMAL HIGH (ref 2.5–4.6)
Potassium: 4 mmol/L (ref 3.5–5.1)
Sodium: 137 mmol/L (ref 135–145)

## 2023-09-09 LAB — ANA W/REFLEX IF POSITIVE: Anti Nuclear Antibody (ANA): NEGATIVE

## 2023-09-09 LAB — C4 COMPLEMENT: Complement C4, Body Fluid: 31 mg/dL (ref 12–38)

## 2023-09-09 LAB — MAGNESIUM: Magnesium: 1.8 mg/dL (ref 1.7–2.4)

## 2023-09-09 MED ORDER — POLYETHYLENE GLYCOL 3350 17 G PO PACK
17.0000 g | PACK | Freq: Every day | ORAL | Status: DC
  Administered 2023-09-09 – 2023-09-19 (×11): 17 g via ORAL
  Filled 2023-09-09 (×11): qty 1

## 2023-09-09 MED ORDER — LACTATED RINGERS IV SOLN: INTRAVENOUS | Status: DC

## 2023-09-09 MED ORDER — SENNOSIDES-DOCUSATE SODIUM 8.6-50 MG PO TABS
1.0000 | ORAL_TABLET | Freq: Two times a day (BID) | ORAL | Status: DC
  Administered 2023-09-09 – 2023-09-19 (×22): 1 via ORAL
  Filled 2023-09-09 (×22): qty 1

## 2023-09-09 MED ORDER — NAPHAZOLINE-GLYCERIN 0.012-0.25 % OP SOLN
1.0000 [drp] | Freq: Four times a day (QID) | OPHTHALMIC | Status: DC | PRN
  Administered 2023-09-09 – 2023-09-10 (×2): 1 [drp] via OPHTHALMIC

## 2023-09-09 NOTE — Progress Notes (Signed)
[facility] Progress Note    Assessment/ Plan:   AKI -suspecting this is ATN in the context of prolonged poor oral intake/dehydration with concomitant Micardis use as well as potential outlet obstruction versus progressive CKD. His baseline Cr was around 1.27 in 2018-I do not have anything to compare to since then -Has great urine output.  Not exhibiting any hypovolemia/uremic signs or symptoms.  No indications for renal replacement therapy yet.  I am hoping that he is in plateau phase of ATN.  If renal replacement therapy is indicated then we may need to pursue either urgent start PD which we would not be able to do here versus hemodialysis with plan to transition as an outpatient to PD.  His niece (Dr. Veatrice Bourbon at [facility]) is a great advocate. Per family, he is functional at baseline -continue to avoid RAAS inhibition/diuretics -I agree with fluids today: LR 75 cc/h x 1 day.  Reassess thereafter. -serologies/secondary work up pending -Avoid nephrotoxic medications including NSAIDs and iodinated intravenous contrast exposure unless the latter is absolutely indicated.  Preferred narcotic agents for pain control are hydromorphone, fentanyl, and methadone. Morphine should not be used. Avoid Baclofen and avoid oral sodium phosphate and magnesium citrate based laxatives / bowel preps. Continue strict Input and Output monitoring. Will monitor the patient closely with you and intervene or adjust therapy as indicated by changes in clinical status/labs   Proteinuria, subnephrotic -suspecting this is just secondary to AKI. Secondary/serological work up still pending  Elevated troponins/chest pain -severe LVH, EF 60-65%, left atrial size severely dilaterd -has been seen by cardiology. Per primary. Currently chest pain free  HTN -BP at times on the softer side -not on any anti-HTNs -cont to monitor  CKD MBD -PTH WNL. PO4 acceptable  Anemia of CKD -hgb 10, no indications for  ESA. If continues to trend down, will initiate ESA therapy -transfuse PRN for hgb <7  Parkinson disease -on sinemet. Per primary -this could be a limiting factor for candidacy for PD however baseline functional status is reported as good  Discussed with son at the bedside. Discussed with Dr. Ida Rogue (niece, [facility]) over the phone.  Will be monitoring chart tomorrow. May not be physically seen unless clinically indicated. Please feel free to call with any questions/concerns.  Anthony Sar, MD [facility]  Subjective:   Patient seen and examined bedside. Son at bedside. Son reports that he noticed dad's speech is stronger. Great appetite today. When prompted he denies any chest pain, shortness of breath, swelling, nausea/vomiting, loss of appetite, dysgeusia.   Objective:   BP 119/68 (BP Location: Right Arm)   Pulse 72   Temp 98.7 F (37.1 C) (Oral)   Resp 17   Ht 5\' 9"  (1.753 m)   Wt 77.3 kg   SpO2 97%   BMI 25.17 kg/m   Intake/Output Summary (Last 24 hours) at 09/09/2023 1527 Last data filed at 09/09/2023 1500 Gross per 24 hour  Intake 2500.95 ml  Output 3725 ml  Net -1224.05 ml   Weight change:   Physical Exam: Gen: NAD CVS: RRR Resp: CTA B/L anteriorly Abd: soft Ext: no significant edema b/l Les Neuro: awake, alert, conversant, no myoclonic jerking observed  Imaging: No results found.  Labs: BMET Recent Labs  Lab 09/06/23 1521 09/07/23 0408 09/08/23 0411 09/08/23 1000 09/09/23 0416  NA 141 141 139  --  137  K 4.1 3.9 4.1  --  4.0  CL 103 104 103  --  103  CO2 27 24 24   --  23  GLUCOSE 107* 88 92  --  89  BUN 73* 72* 69*  --  71*  CREATININE 7.82* 8.02* 8.46*  --  8.50*  CALCIUM 9.3 8.7* 8.5*  --  8.4*  PHOS  --   --   --  4.6 4.9*   CBC Recent Labs  Lab 09/06/23 1521 09/08/23 0411 09/09/23 0416  WBC 6.0 5.4 5.0  HGB 11.9* 10.2* 10.0*  HCT 35.8* 29.9* 30.2*  MCV 100.8* 98.7 98.7  PLT 197 193 185     Medications:     amLODipine  10 mg Oral Daily   aspirin EC  81 mg Oral Daily   atorvastatin  40 mg Oral Daily   Chlorhexidine Gluconate Cloth  6 each Topical Daily   polyethylene glycol  17 g Oral Daily   senna-docusate  1 tablet Oral BID   tamsulosin  0.4 mg Oral Daily      Anthony Sar, MD [facility] 09/09/2023, 3:27 PM

## 2023-09-09 NOTE — Progress Notes (Signed)
PROGRESS NOTE    Raymond Burton  GNF:[id number] DOB: 05/08/1941 DOA: 09/06/2023 PCP: Renaye Rakers, MD   Brief Narrative:    Raymond Burton is a 82 y.o. male with medical history significant for bradycardia, hypertension, prediabetes, Parkinson's, TIA. Patient was brought to the ED by family with complaints of generalized weakness. 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 Family reports gradual weakness over the past several weeks to months, over the past 3 days it has been more pronounced, with patient lying in bed.  He also reports gradual onset of poor appetite in the same time period, which is different from his baseline.  He has been admitted for AKI versus progression of CKD as well as elevated troponin levels.  Nephrology has ordered further serologies and has continued IV fluid for now with plans to recheck in a couple days to see what family has decided regarding initiation of dialysis.  Assessment & Plan:   Principal Problem:   AKI (acute kidney injury) (HCC) Active Problems:   Elevated troponin   Essential hypertension   Bradycardia   Pre-diabetes   Parkinson disease (HCC)  Assessment and Plan:    AKI (acute kidney injury) (HCC) versus progression of CKD; nonoliguiric Creatinine markedly elevated at 7.82.  Last checked per chart 2018 creatinine was 1.27 with GFR of 64.  Unknown if baseline CKD, acuity of renal dysfunction.  Family reports chronic poor oral intake, he is also on telmisartan HCTZ.  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  -UA with small hemoglobin, 30 protein - continue LR now 75 mL/h x 24 hours -Renal ultrasound with no signs of hydronephrosis -Insert Foley -Appreciate ongoing nephrology evaluation -Hold HCTZ telmisartan (25/80)   Elevated troponin Troponin 458 > 475.  Reports some chest pain previously but is unable to tell when I will give details.  Currently chest pain-free.  EKG  without ST or T wave changes. - EDP talked to cardiology, does not feel that this is very concerning with normal EKG in the setting of significant renal dysfunction, should be okay for patient to stay at [facility].  -Echocardiogram with no wall motion abnormalities and preserved LVEF -IV heparin discontinued 11/29   Parkinson disease (HCC) Follows with neurology.  Baseline dementia, at baseline he can answer simple questions, but on bad days he is barely able to communicate. -Resume Sinemet   Pre-diabetes - Hgba1c 6.0%   Bradycardia Sinus bradycardia by EKG.  Heart rate 49-63.  History of bradycardia.  Not on rate limiting medications. -TSH normal at 2.05   Essential hypertension Stable. -Currently on Norvasc  Worsening anemia of CKD No indication for ESA Iron panel ordered per nephrology  Constipation Start MiraLAX and Senokot    DVT prophylaxis: Heparin Code Status: Full Family Communication: Wife present at bedside 11/30, son on phone Disposition Plan: Await further IV fluid and decision on hemodialysis Status is: Inpatient Remains inpatient appropriate because: Need for IV fluid.   Consultants:  Nephrology  Procedures:  None  Antimicrobials:  None   Subjective: Patient seen and evaluated today with no new acute complaints or concerns. No acute concerns or events noted overnight.  He continues to feel weak and have poor appetite with mild nausea on occasion.  Has not had a bowel movement.  Objective: Vitals:   09/08/23 0451 09/08/23 1415 09/08/23 1919 09/09/23 0429  BP: 124/73 (!) 111/59 127/75 123/68  Pulse: 69 (!) 58 63 (!) 55  Resp:  18 18 20 16   Temp: 98.8 F (37.1 C) 98.6 F (37 C) 98.3 F (36.8 C) 98.5 F (36.9 C)  TempSrc: Oral Oral  Oral  SpO2: 98% 97% 100% 95%  Weight:      Height:        Intake/Output Summary (Last 24 hours) at 09/09/2023 1007 Last data filed at 09/09/2023 0434 Gross per 24 hour  Intake 2120 ml  Output 3725 ml  Net  -1605 ml   Filed Weights   09/06/23 1402 09/06/23 2013  Weight: 79.4 kg 77.3 kg    Examination:  General exam: Appears calm and comfortable  Respiratory system: Clear to auscultation. Respiratory effort normal. Cardiovascular system: S1 & S2 heard, RRR.  Gastrointestinal system: Abdomen is soft Central nervous system: Alert and awake Extremities: No edema Skin: No significant lesions noted Psychiatry: Flat affect.    Data Reviewed: I have personally reviewed following labs and imaging studies  CBC: Recent Labs  Lab 09/06/23 1521 09/08/23 0411 09/09/23 0416  WBC 6.0 5.4 5.0  HGB 11.9* 10.2* 10.0*  HCT 35.8* 29.9* 30.2*  MCV 100.8* 98.7 98.7  PLT 197 193 185   Basic Metabolic Panel: Recent Labs  Lab 09/06/23 1521 09/07/23 0408 09/08/23 0411 09/08/23 1000 09/09/23 0416  NA 141 141 139  --  137  K 4.1 3.9 4.1  --  4.0  CL 103 104 103  --  103  CO2 27 24 24   --  23  GLUCOSE 107* 88 92  --  89  BUN 73* 72* 69*  --  71*  CREATININE 7.82* 8.02* 8.46*  --  8.50*  CALCIUM 9.3 8.7* 8.5*  --  8.4*  MG  --   --  1.9  --  1.8  PHOS  --   --   --  4.6 4.9*   GFR: Estimated Creatinine Clearance: 6.8 mL/min (A) (by C-G formula based on SCr of 8.5 mg/dL (H)). Liver Function Tests: Recent Labs  Lab 09/06/23 1521 09/09/23 0416  AST 26  --   ALT 8  --   ALKPHOS 44  --   BILITOT 0.9  --   PROT 7.2  --   ALBUMIN 3.6 2.7*   No results for input(s): "LIPASE", "AMYLASE" in the last 168 hours. No results for input(s): "AMMONIA" in the last 168 hours. Coagulation Profile: Recent Labs  Lab 09/06/23 1521  INR 1.1   Cardiac Enzymes: No results for input(s): "CKTOTAL", "CKMB", "CKMBINDEX", "TROPONINI" in the last 168 hours. BNP (last 3 results) No results for input(s): "PROBNP" in the last 8760 hours. HbA1C: Recent Labs    09/06/23 1521  HGBA1C 6.0*   CBG: No results for input(s): "GLUCAP" in the last 168 hours. Lipid Profile: No results for input(s): "CHOL",  "HDL", "LDLCALC", "TRIG", "CHOLHDL", "LDLDIRECT" in the last 72 hours. Thyroid Function Tests: Recent Labs    09/06/23 1521  TSH 2.050  FREET4 0.88   Anemia Panel: Recent Labs    09/08/23 1001  FERRITIN 398*  TIBC 150*  IRON 80   Sepsis Labs: No results for input(s): "PROCALCITON", "LATICACIDVEN" in the last 168 hours.  Recent Results (from the past 240 hour(s))  Resp panel by RT-PCR (RSV, Flu A&B, Covid) Anterior Nasal Swab     Status: None   Collection Time: 09/06/23  3:21 PM   Specimen: Anterior Nasal Swab  Result Value Ref Range Status   SARS Coronavirus 2 by RT PCR NEGATIVE NEGATIVE Final    Comment: (NOTE) SARS-CoV-2 target  nucleic acids are NOT DETECTED.  The SARS-CoV-2 RNA is generally detectable in upper respiratory specimens during the acute phase of infection. The lowest concentration of SARS-CoV-2 viral copies this assay can detect is 138 copies/mL. A negative result does not preclude SARS-Cov-2 infection and should not be used as the sole basis for treatment or other patient management decisions. A negative result may occur with  improper specimen collection/handling, submission of specimen other than nasopharyngeal swab, presence of viral mutation(s) within the areas targeted by this assay, and inadequate number of viral copies(<138 copies/mL). A negative result must be combined with clinical observations, patient history, and epidemiological information. The expected result is Negative.  Fact Sheet for Patients:  BloggerCourse.com  Fact Sheet for Healthcare Providers:  SeriousBroker.it  This test is no t yet approved or cleared by the [place] FDA and  has been authorized for detection and/or diagnosis of SARS-CoV-2 by FDA under an Emergency Use Authorization (EUA). This EUA will remain  in effect (meaning this test can be used) for the duration of the COVID-19 declaration under Section 564(b)(1)  of the Act, 21 U.S.C.section 360bbb-3(b)(1), unless the authorization is terminated  or revoked sooner.       Influenza A by PCR NEGATIVE NEGATIVE Final   Influenza B by PCR NEGATIVE NEGATIVE Final    Comment: (NOTE) The Xpert Xpress SARS-CoV-2/FLU/RSV plus assay is intended as an aid in the diagnosis of influenza from Nasopharyngeal swab specimens and should not be used as a sole basis for treatment. Nasal washings and aspirates are unacceptable for Xpert Xpress SARS-CoV-2/FLU/RSV testing.  Fact Sheet for Patients: BloggerCourse.com  Fact Sheet for Healthcare Providers: SeriousBroker.it  This test is not yet approved or cleared by the [place] FDA and has been authorized for detection and/or diagnosis of SARS-CoV-2 by FDA under an Emergency Use Authorization (EUA). This EUA will remain in effect (meaning this test can be used) for the duration of the COVID-19 declaration under Section 564(b)(1) of the Act, 21 U.S.C. section 360bbb-3(b)(1), unless the authorization is terminated or revoked.     Resp Syncytial Virus by PCR NEGATIVE NEGATIVE Final    Comment: (NOTE) Fact Sheet for Patients: BloggerCourse.com  Fact Sheet for Healthcare Providers: SeriousBroker.it  This test is not yet approved or cleared by the [place] FDA and has been authorized for detection and/or diagnosis of SARS-CoV-2 by FDA under an Emergency Use Authorization (EUA). This EUA will remain in effect (meaning this test can be used) for the duration of the COVID-19 declaration under Section 564(b)(1) of the Act, 21 U.S.C. section 360bbb-3(b)(1), unless the authorization is terminated or revoked.  Performed at [facility], [address], [place], [place] [address]   Surgical PCR screen     Status: None   Collection Time: 09/07/23 10:45 PM   Specimen: Nasal Mucosa; Nasal Swab  Result Value Ref  Range Status   MRSA, PCR NEGATIVE NEGATIVE Final   Staphylococcus aureus NEGATIVE NEGATIVE Final    Comment: (NOTE) The Xpert SA Assay (FDA approved for NASAL specimens in patients 13 years of age and older), is one component of a comprehensive surveillance program. It is not intended to diagnose infection nor to guide or monitor treatment. Performed at [facility], [address], [place], [place] [address]          Radiology Studies: ECHOCARDIOGRAM COMPLETE  Result Date: 09/07/2023    ECHOCARDIOGRAM REPORT   Patient Name:   Raymond Burton Date of Exam: 09/07/2023 Medical Rec #:  [id number]     Height:       69.0 in Accession #:    [id number]    Weight:       170.4 lb Date of Birth:  04/07/1941    BSA:          1.930 m Patient Age:    82 years      BP:           120/73 mmHg Patient Gender: M             HR:           75 bpm. [facility] Procedure: 2D Echo, Color Doppler and Cardiac Doppler Indications:    elevated troponin  History:        Patient has prior history of Echocardiogram examinations, most                 recent 02/04/2009. Parkinson's disease, Signs/Symptoms:Murmur;                 Risk Factors:Hypertension and Dyslipidemia.  Sonographer:    Delcie Roch RDCS Referring Phys: [id number] EMOKPAE IMPRESSIONS  1. Left ventricular ejection fraction, by estimation, is 60 to 65%. The left ventricle has normal function. The left ventricle has no regional wall motion abnormalities. 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 Left  ventricular diastolic parameters were normal.  2. Right ventricular systolic function is normal. The right ventricular size is normal. There is normal pulmonary artery systolic pressure.  3. Left atrial size was severely dilated.  4. The mitral valve is normal in structure. Trivial mitral valve regurgitation. No evidence of mitral stenosis.  5. The aortic valve is tricuspid. Aortic valve regurgitation is  trivial. No aortic stenosis is present.  6. Aortic dilatation noted. There is borderline dilatation of the ascending aorta, measuring 38 mm.  7. The inferior vena cava is normal in size with greater than 50% respiratory variability, suggesting right atrial pressure of 3 mmHg. Comparison(s): No prior Echocardiogram. FINDINGS  Left Ventricle: Left ventricular ejection fraction, by estimation, is 60 to 65%. The left ventricle has normal function. The left ventricle has no regional wall motion abnormalities. The left ventricular internal cavity size was normal in size. 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 Left ventricular diastolic parameters were normal. Normal left ventricular filling pressure. Right Ventricle: The right ventricular size is normal. No increase in right ventricular wall thickness. Right ventricular systolic function is normal. There is normal pulmonary artery systolic pressure. The tricuspid regurgitant velocity is 2.57 m/s, and  with an assumed right atrial pressure of 3 mmHg, the estimated right ventricular systolic pressure is 29.4 mmHg. Left Atrium: Left atrial size was severely dilated. Right Atrium: Right atrial size was normal in size. Pericardium: There is no evidence of pericardial effusion. Mitral Valve: The mitral valve is normal in structure. Trivial mitral valve regurgitation. No evidence of mitral valve stenosis. Tricuspid Valve: The tricuspid valve is normal in structure. Tricuspid valve regurgitation is trivial. No evidence of tricuspid stenosis. Aortic Valve: The aortic valve is tricuspid. Aortic valve regurgitation is trivial. No aortic stenosis is present. Pulmonic Valve: The pulmonic valve was normal in structure. Pulmonic valve regurgitation is mild. No evidence of pulmonic stenosis. Aorta: The aortic root is normal in size and structure and aortic dilatation noted. There is borderline dilatation of the ascending aorta, measuring 38 mm.  Venous: The inferior vena cava is normal in  size with greater than 50% respiratory variability, suggesting right atrial pressure of 3 mmHg. IAS/Shunts: No atrial level shunt detected by color flow Doppler.  LEFT VENTRICLE PLAX 2D LVIDd:         4.30 cm   Diastology LVIDs:         2.50 cm   LV e' medial:    4.57 cm/s LV PW:         1.40 cm   LV E/e' medial:  17.9 LV IVS:        1.50 cm   LV e' lateral:   7.07 cm/s LVOT diam:     2.20 cm   LV E/e' lateral: 11.6 LV SV:         79 LV SV Index:   41 LVOT Area:     3.80 cm  RIGHT VENTRICLE             IVC RV Basal diam:  3.10 cm     IVC diam: 1.80 cm RV S prime:     11.40 cm/s TAPSE (M-mode): 1.9 cm LEFT ATRIUM              Index        RIGHT ATRIUM           Index LA diam:        4.20 cm  2.18 cm/m   RA Area:     16.40 cm LA Vol (A2C):   111.0 ml 57.51 ml/m  RA Volume:   43.30 ml  22.43 ml/m LA Vol (A4C):   106.0 ml 54.92 ml/m LA Biplane Vol: 108.0 ml 55.96 ml/m  AORTIC VALVE             PULMONIC VALVE LVOT Vmax:   99.80 cm/s  PR End Diast Vel: 11.02 msec LVOT Vmean:  67.300 cm/s LVOT VTI:    0.207 m  AORTA Ao Root diam: 3.20 cm Ao Asc diam:  3.80 cm MITRAL VALVE               TRICUSPID VALVE MV Area (PHT): 4.36 cm    TR Peak grad:   26.4 mmHg MV Decel Time: 174 msec    TR Vmax:        257.00 cm/s MV E velocity: 81.80 cm/s MV A velocity: 65.10 cm/s  SHUNTS MV E/A ratio:  1.26        Systemic VTI:  0.21 m                            Systemic Diam: 2.20 cm Vishnu Priya Mallipeddi Electronically signed by Winfield Rast Mallipeddi Signature Date/Time: 09/07/2023/3:23:32 PM    Final         Scheduled Meds:  amLODipine  10 mg Oral Daily   aspirin EC  81 mg Oral Daily   atorvastatin  40 mg Oral Daily   Chlorhexidine Gluconate Cloth  6 each Topical Daily   polyethylene glycol  17 g Oral Daily   senna-docusate  1 tablet Oral BID   tamsulosin  0.4 mg Oral Daily   Continuous Infusions:  lactated ringers 75 mL/hr at 09/09/23 0933     LOS: 3 days    Time  spent: 55 minutes    Nicodemus Denk D Sherryll Burger, DO [facility]  If 7PM-7AM, please contact night-coverage [email or web address] 09/09/2023, 10:07 AM

## 2023-09-09 NOTE — Plan of Care (Signed)
  Problem: Health Behavior/Discharge Planning: Goal: Ability to manage health-related needs will improve Outcome: Progressing   Problem: Clinical Measurements: Goal: Ability to maintain clinical measurements within normal limits will improve Outcome: Progressing Goal: Will remain free from infection Outcome: Progressing Goal: Diagnostic test results will improve Outcome: Progressing Goal: Cardiovascular complication will be avoided Outcome: Progressing

## 2023-09-09 NOTE — Plan of Care (Signed)
  Problem: Health Behavior/Discharge Planning: Goal: Ability to manage health-related needs will improve Outcome: Progressing   Problem: Clinical Measurements: Goal: Ability to maintain clinical measurements within normal limits will improve Outcome: Progressing Goal: Will remain free from infection Outcome: Progressing Goal: Diagnostic test results will improve Outcome: Progressing Goal: Cardiovascular complication will be avoided Outcome: Progressing   Problem: Nutrition: Goal: Adequate nutrition will be maintained Outcome: Progressing

## 2023-09-10 DIAGNOSIS — N179 Acute kidney failure, unspecified: Secondary | ICD-10-CM | POA: Diagnosis not present

## 2023-09-10 LAB — BASIC METABOLIC PANEL
Anion gap: 11 (ref 5–15)
BUN: 72 mg/dL — ABNORMAL HIGH (ref 8–23)
CO2: 24 mmol/L (ref 22–32)
Calcium: 8.4 mg/dL — ABNORMAL LOW (ref 8.9–10.3)
Chloride: 101 mmol/L (ref 98–111)
Creatinine, Ser: 8.4 mg/dL — ABNORMAL HIGH (ref 0.61–1.24)
GFR, Estimated: 6 mL/min — ABNORMAL LOW (ref >60)
Glucose, Bld: 91 mg/dL (ref 70–99)
Potassium: 4 mmol/L (ref 3.5–5.1)
Sodium: 136 mmol/L (ref 135–145)

## 2023-09-10 MED ORDER — LACTATED RINGERS IV SOLN: INTRAVENOUS | Status: DC

## 2023-09-10 NOTE — Plan of Care (Signed)
  Problem: Health Behavior/Discharge Planning: Goal: Ability to manage health-related needs will improve Outcome: Progressing   Problem: Clinical Measurements: Goal: Ability to maintain clinical measurements within normal limits will improve Outcome: Progressing Goal: Will remain free from infection Outcome: Progressing Goal: Diagnostic test results will improve Outcome: Progressing Goal: Cardiovascular complication will be avoided Outcome: Progressing   Problem: Nutrition: Goal: Adequate nutrition will be maintained Outcome: Progressing

## 2023-09-10 NOTE — Progress Notes (Signed)
PROGRESS NOTE    Raymond Burton  KGM:[id number] DOB: 1941-06-19 DOA: 09/06/2023 PCP: Renaye Rakers, MD   Brief Narrative:    Raymond Burton is a 82 y.o. male with medical history significant for bradycardia, hypertension, prediabetes, Parkinson's, TIA. Patient was brought to the ED by family with complaints of generalized weakness. 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 Family reports gradual weakness over the past several weeks to months, over the past 3 days it has been more pronounced, with patient lying in bed.  He also reports gradual onset of poor appetite in the same time period, which is different from his baseline.  He has been admitted for AKI versus progression of CKD as well as elevated troponin levels.  Nephrology has ordered further serologies and has continued IV fluid for now.  Appears to be slowly improving.  Assessment & Plan:   Principal Problem:   AKI (acute kidney injury) (HCC) Active Problems:   Elevated troponin   Essential hypertension   Bradycardia   Pre-diabetes   Parkinson disease (HCC)  Assessment and Plan:    AKI (acute kidney injury) (HCC) versus progression of CKD; nonoliguiric Creatinine markedly elevated at 7.82.  Last checked per chart 2018 creatinine was 1.27 with GFR of 64.  Unknown if baseline CKD, acuity of renal dysfunction.  Family reports chronic poor oral intake, he is also on telmisartan HCTZ.  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  -UA with small hemoglobin, 30 protein - continue LR now 75 mL/h x 24 hours -Renal ultrasound with no signs of hydronephrosis -Insert Foley -Appreciate ongoing nephrology evaluation -Hold HCTZ telmisartan (25/80)   Elevated troponin Troponin 458 > 475.  Reports some chest pain previously but is unable to tell when I will give details.  Currently chest pain-free.  EKG without ST or T wave changes. - EDP talked to cardiology, does not feel  that this is very concerning with normal EKG in the setting of significant renal dysfunction, should be okay for [facility] to [facility].  -Echocardiogram with no wall motion abnormalities and preserved LVEF -IV heparin discontinued 11/29   Parkinson disease (HCC) Follows with neurology.  Baseline dementia, at baseline he can answer simple questions, but on bad days he is barely able to communicate. -Resume Sinemet   Pre-diabetes - Hgba1c 6.0%   Bradycardia Sinus bradycardia by EKG.  Heart rate 49-63.  History of bradycardia.  Not on rate limiting medications. -TSH normal at 2.05   Essential hypertension Stable. -Currently on Norvasc  Worsening anemia of CKD No indication for ESA Iron panel ordered per nephrology  Constipation Start MiraLAX and Senokot    DVT prophylaxis: Heparin Code Status: Full Family Communication: Wife present at bedside 12/1 Disposition Plan: Await further IV fluid and decision on hemodialysis Status is: Inpatient Remains inpatient appropriate because: Need for IV fluid.   Consultants:  Nephrology  Procedures:  None  Antimicrobials:  None   Subjective: Patient seen and evaluated today with no new acute complaints or concerns. No acute concerns or events noted overnight.  He overall appears to be doing much better and is more alert and awake and eating breakfast this morning.  He has had a bowel movement yesterday.  Objective: Vitals:   09/09/23 0429 09/09/23 1300 09/09/23 1916 09/10/23 0412  BP: 123/68 119/68 103/66 125/72  Pulse: (!) 55 72 66 69  Resp: 16 17 18 16   Temp: 98.5 F (36.9 C) 98.7  F (37.1 C) 98.5 F (36.9 C) 98 F (36.7 C)  TempSrc: Oral Oral Oral Oral  SpO2: 95% 97% 97% 95%  Weight:      Height:        Intake/Output Summary (Last 24 hours) at 09/10/2023 0923 Last data filed at 09/10/2023 0439 Gross per 24 hour  Intake 2060.95 ml  Output 2750 ml  Net -689.05 ml   Filed Weights   09/06/23 1402 09/06/23  2013  Weight: 79.4 kg 77.3 kg    Examination:  General exam: Appears calm and comfortable  Respiratory system: Clear to auscultation. Respiratory effort normal. Cardiovascular system: S1 & S2 heard, RRR.  Gastrointestinal system: Abdomen is soft Central nervous system: Alert and awake Extremities: No edema Skin: No significant lesions noted Psychiatry: Flat affect.    Data Reviewed: I have personally reviewed following labs and imaging studies  CBC: Recent Labs  Lab 09/06/23 1521 09/08/23 0411 09/09/23 0416  WBC 6.0 5.4 5.0  HGB 11.9* 10.2* 10.0*  HCT 35.8* 29.9* 30.2*  MCV 100.8* 98.7 98.7  PLT 197 193 185   Basic Metabolic Panel: Recent Labs  Lab 09/06/23 1521 09/07/23 0408 09/08/23 0411 09/08/23 1000 09/09/23 0416 09/10/23 0405  NA 141 141 139  --  137 136  K 4.1 3.9 4.1  --  4.0 4.0  CL 103 104 103  --  103 101  CO2 27 24 24   --  23 24  GLUCOSE 107* 88 92  --  89 91  BUN 73* 72* 69*  --  71* 72*  CREATININE 7.82* 8.02* 8.46*  --  8.50* 8.40*  CALCIUM 9.3 8.7* 8.5*  --  8.4* 8.4*  MG  --   --  1.9  --  1.8  --   PHOS  --   --   --  4.6 4.9*  --    GFR: Estimated Creatinine Clearance: 6.9 mL/min (A) (by C-G formula based on SCr of 8.4 mg/dL (H)). Liver Function Tests: Recent Labs  Lab 09/06/23 1521 09/09/23 0416  AST 26  --   ALT 8  --   ALKPHOS 44  --   BILITOT 0.9  --   PROT 7.2  --   ALBUMIN 3.6 2.7*   No results for input(s): "LIPASE", "AMYLASE" in the last 168 hours. No results for input(s): "AMMONIA" in the last 168 hours. Coagulation Profile: Recent Labs  Lab 09/06/23 1521  INR 1.1   Cardiac Enzymes: No results for input(s): "CKTOTAL", "CKMB", "CKMBINDEX", "TROPONINI" in the last 168 hours. BNP (last 3 results) No results for input(s): "PROBNP" in the last 8760 hours. HbA1C: No results for input(s): "HGBA1C" in the last 72 hours.  CBG: No results for input(s): "GLUCAP" in the last 168 hours. Lipid Profile: No results for  input(s): "CHOL", "HDL", "LDLCALC", "TRIG", "CHOLHDL", "LDLDIRECT" in the last 72 hours. Thyroid Function Tests: No results for input(s): "TSH", "T4TOTAL", "FREET4", "T3FREE", "THYROIDAB" in the last 72 hours.  Anemia Panel: Recent Labs    09/08/23 1001  FERRITIN 398*  TIBC 150*  IRON 80   Sepsis Labs: No results for input(s): "PROCALCITON", "LATICACIDVEN" in the last 168 hours.  Recent Results (from the past 240 hour(s))  Resp panel by RT-PCR (RSV, Flu A&B, Covid) Anterior Nasal Swab     Status: None   Collection Time: 09/06/23  3:21 PM   Specimen: Anterior Nasal Swab  Result Value Ref Range Status   SARS Coronavirus 2 by RT PCR NEGATIVE NEGATIVE Final  Comment: (NOTE) SARS-CoV-2 target nucleic acids are NOT DETECTED.  The SARS-CoV-2 RNA is generally detectable in upper respiratory specimens during the acute phase of infection. The lowest concentration of SARS-CoV-2 viral copies this assay can detect is 138 copies/mL. A negative result does not preclude SARS-Cov-2 infection and should not be used as the sole basis for treatment or other patient management decisions. A negative result may occur with  improper specimen collection/handling, submission of specimen other than nasopharyngeal swab, presence of viral mutation(s) within the areas targeted by this assay, and inadequate number of viral copies(<138 copies/mL). A negative result must be combined with clinical observations, patient history, and epidemiological information. The expected result is Negative.  Fact Sheet for Patients:  BloggerCourse.com  Fact Sheet for Healthcare Providers:  SeriousBroker.it  This test is no t yet approved or cleared by the [place] FDA and  has been authorized for detection and/or diagnosis of SARS-CoV-2 by FDA under an Emergency Use Authorization (EUA). This EUA will remain  in effect (meaning this test can be used) for the  duration of the COVID-19 declaration under Section 564(b)(1) of the Act, 21 U.S.C.section 360bbb-3(b)(1), unless the authorization is terminated  or revoked sooner.       Influenza A by PCR NEGATIVE NEGATIVE Final   Influenza B by PCR NEGATIVE NEGATIVE Final    Comment: (NOTE) The Xpert Xpress SARS-CoV-2/FLU/RSV plus assay is intended as an aid in the diagnosis of influenza from Nasopharyngeal swab specimens and should not be used as a sole basis for treatment. Nasal washings and aspirates are unacceptable for Xpert Xpress SARS-CoV-2/FLU/RSV testing.  Fact Sheet for Patients: BloggerCourse.com  Fact Sheet for Healthcare Providers: SeriousBroker.it  This test is not yet approved or cleared by the [place] FDA and has been authorized for detection and/or diagnosis of SARS-CoV-2 by FDA under an Emergency Use Authorization (EUA). This EUA will remain in effect (meaning this test can be used) for the duration of the COVID-19 declaration under Section 564(b)(1) of the Act, 21 U.S.C. section 360bbb-3(b)(1), unless the authorization is terminated or revoked.     Resp Syncytial Virus by PCR NEGATIVE NEGATIVE Final    Comment: (NOTE) Fact Sheet for Patients: BloggerCourse.com  Fact Sheet for Healthcare Providers: SeriousBroker.it  This test is not yet approved or cleared by the [place] FDA and has been authorized for detection and/or diagnosis of SARS-CoV-2 by FDA under an Emergency Use Authorization (EUA). This EUA will remain in effect (meaning this test can be used) for the duration of the COVID-19 declaration under Section 564(b)(1) of the Act, 21 U.S.C. section 360bbb-3(b)(1), unless the authorization is terminated or revoked.  Performed at [facility], [address] [place], [place] [address]   Surgical PCR screen     Status: None   Collection Time: 09/07/23  10:45 PM   Specimen: Nasal Mucosa; Nasal Swab  Result Value Ref Range Status   MRSA, PCR NEGATIVE NEGATIVE Final   Staphylococcus aureus NEGATIVE NEGATIVE Final    Comment: (NOTE) The Xpert SA Assay (FDA approved for NASAL specimens in patients 60 years of age and older), is one component of a comprehensive surveillance program. It is not intended to diagnose infection nor to guide or monitor treatment. Performed at [facility], [address] [place], [place] [address]          Radiology Studies: No results found.      Scheduled Meds:  amLODipine  10 mg Oral Daily   aspirin EC  81 mg  Oral Daily   atorvastatin  40 mg Oral Daily   Chlorhexidine Gluconate Cloth  6 each Topical Daily   polyethylene glycol  17 g Oral Daily   senna-docusate  1 tablet Oral BID   tamsulosin  0.4 mg Oral Daily   Continuous Infusions:  lactated ringers       LOS: 4 days    Time spent: 35 minutes    Lashea Goda Hoover Brunette, DO [facility]  If 7PM-7AM, please contact night-coverage [email or web address] 09/10/2023, 9:23 AM

## 2023-09-10 NOTE — Plan of Care (Signed)
  Problem: Health Behavior/Discharge Planning: Goal: Ability to manage health-related needs Raymond Burton improve Outcome: Progressing   Problem: Nutrition: Goal: Adequate nutrition Raymond Burton be maintained Outcome: Progressing   

## 2023-09-10 NOTE — Progress Notes (Signed)
[facility] Progress Note    Assessment/ Plan:   AKI -suspecting this is ATN in the context of prolonged poor oral intake/dehydration with concomitant Micardis use as well as potential outlet obstruction versus progressive CKD. His baseline Cr was around 1.27 in 2018-I do not have anything to compare to since then -Has great urine output, Cr stable.  Not exhibiting any uremic signs or symptoms.  No indications for renal replacement therapy yet.  I am hoping that he is in plateau phase of ATN. His niece (Dr. Veatrice Bourbon at [facility]) is a great advocate. Per family, he is functional at baseline -continue to avoid RAAS inhibition/diuretics -stop fluids today. Would like to see what his kidney function does. Also, he is starting to develop some edema of his b/l UEs. He is is having adequately PO intake as per family, encouraged to keep doing this -if kidney function continues to worsen or does not improve then we will need to decide in regards to which modality they end up choosing. Discussed at length HD vs PD today. We discussed what life would look like on either PD or HD. Discussed risks of PD and how this is done. If they decide on urgent start PD, then would recommend transferring to [facility] for this. We can certainly take over his long term PD care as an outpatient (if feasible). Of note, he has great support system at home -If not opting for PD, then would recommend HD/TDC with slow start protocol -Avoid nephrotoxic medications including NSAIDs and iodinated intravenous contrast exposure unless the latter is absolutely indicated.  Preferred narcotic agents for pain control are hydromorphone, fentanyl, and methadone. Morphine should not be used. Avoid Baclofen and avoid oral sodium phosphate and magnesium citrate based laxatives / bowel preps. Continue strict Input and Output monitoring. Will monitor the patient closely with you and intervene or adjust therapy as indicated  by changes in clinical status/labs   Proteinuria, subnephrotic -suspecting this is just secondary to AKI. -ANA negative, C3/C4 WNL. SPEP, ANCA, FLC pending  Elevated troponins/chest pain -severe LVH, EF 60-65%, left atrial size severely dilaterd -has been seen by cardiology. Per primary. Currently chest pain free  HTN -BP at times on the softer side -not on any anti-HTNs -cont to monitor  CKD MBD -PTH WNL. PO4 acceptable  Anemia of CKD -hgb 10, no indications for ESA. If continues to trend down, will initiate ESA therapy -transfuse PRN for hgb <7  Parkinson disease -on sinemet. Per primary -this could be a limiting factor for candidacy for PD however baseline functional status is reported as good  Discussed with wife and son at the bedside. Also discussed with other son over their speakerphone. Discussed with Dr. Ida Rogue as well (niece)  Anthony Sar, MD [facility]  Subjective:   Patient seen and examined bedside. Son and wife at the bedside. Has been eating well, drinking more. Just getting a little more swollen now-more evident in his arms.   Objective:   BP 97/61 (BP Location: Right Arm)   Pulse 65   Temp 98 F (36.7 C) (Oral)   Resp 18   Ht 5\' 9"  (1.753 m)   Wt 77.3 kg   SpO2 98%   BMI 25.17 kg/m   Intake/Output Summary (Last 24 hours) at 09/10/2023 1534 Last data filed at 09/10/2023 1403 Gross per 24 hour  Intake 2040 ml  Output 2200 ml  Net -160 ml   Weight change:   Physical Exam: Gen: NAD, in  recliner CVS: RRR Resp: normal wob, unlabored Abd: soft Ext: no significant edema b/l Les, +b/l UE edema (trace) Neuro: awake, alert, conversant/interactive, no myoclonic jerking observed  Imaging: No results found.  Labs: BMET Recent Labs  Lab 09/06/23 1521 09/07/23 0408 09/08/23 0411 09/08/23 1000 09/09/23 0416 09/10/23 0405  NA 141 141 139  --  137 136  K 4.1 3.9 4.1  --  4.0 4.0  CL 103 104 103  --  103 101  CO2 27 24 24    --  23 24  GLUCOSE 107* 88 92  --  89 91  BUN 73* 72* 69*  --  71* 72*  CREATININE 7.82* 8.02* 8.46*  --  8.50* 8.40*  CALCIUM 9.3 8.7* 8.5*  --  8.4* 8.4*  PHOS  --   --   --  4.6 4.9*  --    CBC Recent Labs  Lab 09/06/23 1521 09/08/23 0411 09/09/23 0416  WBC 6.0 5.4 5.0  HGB 11.9* 10.2* 10.0*  HCT 35.8* 29.9* 30.2*  MCV 100.8* 98.7 98.7  PLT 197 193 185    Medications:     amLODipine  10 mg Oral Daily   aspirin EC  81 mg Oral Daily   atorvastatin  40 mg Oral Daily   Chlorhexidine Gluconate Cloth  6 each Topical Daily   polyethylene glycol  17 g Oral Daily   senna-docusate  1 tablet Oral BID   tamsulosin  0.4 mg Oral Daily      Anthony Sar, MD [facility] 09/10/2023, 3:34 PM

## 2023-09-11 DIAGNOSIS — N179 Acute kidney failure, unspecified: Secondary | ICD-10-CM | POA: Diagnosis not present

## 2023-09-11 LAB — KAPPA/LAMBDA LIGHT CHAINS
Kappa free light chain: 22.7 mg/L — ABNORMAL HIGH (ref 3.3–19.4)
Kappa, lambda light chain ratio: 0 — ABNORMAL LOW (ref 0.26–1.65)
Lambda free light chains: 6846.7 mg/L — ABNORMAL HIGH (ref 5.7–26.3)

## 2023-09-11 LAB — BASIC METABOLIC PANEL
Anion gap: 12 (ref 5–15)
BUN: 75 mg/dL — ABNORMAL HIGH (ref 8–23)
CO2: 22 mmol/L (ref 22–32)
Calcium: 8.6 mg/dL — ABNORMAL LOW (ref 8.9–10.3)
Chloride: 100 mmol/L (ref 98–111)
Creatinine, Ser: 8.05 mg/dL — ABNORMAL HIGH (ref 0.61–1.24)
GFR, Estimated: 6 mL/min — ABNORMAL LOW (ref >60)
Glucose, Bld: 98 mg/dL (ref 70–99)
Potassium: 4.2 mmol/L (ref 3.5–5.1)
Sodium: 134 mmol/L — ABNORMAL LOW (ref 135–145)

## 2023-09-11 NOTE — Progress Notes (Signed)
Patient wife called out to the nurses station that patient appears to be restless, unable to sleep. This nurse went to assess patient. Patient c/o chest pain, says it started 30 minutes ago. Rating pain 8/10. Stabbing, tight, squeezing feeling is how he described it. Patient also stated that it hurts to breathe. Patient doesn't appear to be showing any signs of distress. Vitals signs checked. Stable. Temp 97.5, BP 126/71 (88), HR 78, RR 18, 94% on RA. 12-lead EKG done. Charge Nurse Glenda Chroman made aware. [place], DO made aware. No new orders received at this time.

## 2023-09-11 NOTE — Progress Notes (Signed)
Patient ID: Raymond Burton, male   DOB: April 18, 1941, 82 y.o.   MRN: [id number] S: Pt was restless and complaining of left sided chest pain last night.  VS were stable and had EKG performed without new orders.  Feeling better this am.  Wife at bedside. O:BP 133/69 (BP Location: Right Arm)   Pulse 71   Temp 97.6 F (36.4 C) (Oral)   Resp 17   Ht 5\' 9"  (1.753 m)   Wt 77.3 kg   SpO2 96%   BMI 25.17 kg/m   Intake/Output Summary (Last 24 hours) at 09/11/2023 0854 Last data filed at 09/11/2023 0416 Gross per 24 hour  Intake 840 ml  Output 2500 ml  Net -1660 ml   Intake/Output: I/O last 3 completed shifts: In: 1800 [P.O.:1800] Out: 4000 [Urine:4000]  Intake/Output this shift:  No intake/output data recorded. Weight change:  Gen: NAD CVS: RRR Resp: CTA Abd: +BS, soft, NT/ND Ext: minimal pretibial edema bilaterally  Recent Labs  Lab 09/06/23 1521 09/07/23 0408 09/08/23 0411 09/08/23 1000 09/09/23 0416 09/10/23 0405 09/11/23 0449  NA 141 141 139  --  137 136 134*  K 4.1 3.9 4.1  --  4.0 4.0 4.2  CL 103 104 103  --  103 101 100  CO2 27 24 24   --  23 24 22   GLUCOSE 107* 88 92  --  89 91 98  BUN 73* 72* 69*  --  71* 72* 75*  CREATININE 7.82* 8.02* 8.46*  --  8.50* 8.40* 8.05*  ALBUMIN 3.6  --   --   --  2.7*  --   --   CALCIUM 9.3 8.7* 8.5*  --  8.4* 8.4* 8.6*  PHOS  --   --   --  4.6 4.9*  --   --   AST 26  --   --   --   --   --   --   ALT 8  --   --   --   --   --   --    Liver Function Tests: Recent Labs  Lab 09/06/23 1521 09/09/23 0416  AST 26  --   ALT 8  --   ALKPHOS 44  --   BILITOT 0.9  --   PROT 7.2  --   ALBUMIN 3.6 2.7*   No results for input(s): "LIPASE", "AMYLASE" in the last 168 hours. No results for input(s): "AMMONIA" in the last 168 hours. CBC: Recent Labs  Lab 09/06/23 1521 09/08/23 0411 09/09/23 0416  WBC 6.0 5.4 5.0  HGB 11.9* 10.2* 10.0*  HCT 35.8* 29.9* 30.2*  MCV 100.8* 98.7 98.7  PLT 197 193 185   Cardiac Enzymes: No results for  input(s): "CKTOTAL", "CKMB", "CKMBINDEX", "TROPONINI" in the last 168 hours. CBG: No results for input(s): "GLUCAP" in the last 168 hours.  Iron Studies:  Recent Labs    09/08/23 1001  IRON 80  TIBC 150*  FERRITIN 398*   Studies/Results: No results found.  amLODipine  10 mg Oral Daily   aspirin EC  81 mg Oral Daily   atorvastatin  40 mg Oral Daily   Chlorhexidine Gluconate Cloth  6 each Topical Daily   polyethylene glycol  17 g Oral Daily   senna-docusate  1 tablet Oral BID   tamsulosin  0.4 mg Oral Daily    BMET    Component Value Date/Time   NA 134 (L) 09/11/2023 0449   K 4.2 09/11/2023 0449   CL 100 09/11/2023  0449   CO2 22 09/11/2023 0449   GLUCOSE 98 09/11/2023 0449   BUN 75 (H) 09/11/2023 0449   CREATININE 8.05 (H) 09/11/2023 0449   CALCIUM 8.6 (L) 09/11/2023 0449   GFRNONAA 6 (L) 09/11/2023 0449   CBC    Component Value Date/Time   WBC 5.0 09/09/2023 0416   RBC 3.06 (L) 09/09/2023 0416   HGB 10.0 (L) 09/09/2023 0416   HCT 30.2 (L) 09/09/2023 0416   PLT 185 09/09/2023 0416   MCV 98.7 09/09/2023 0416   MCH 32.7 09/09/2023 0416   MCHC 33.1 09/09/2023 0416   RDW 12.5 09/09/2023 0416     Assessment/Plan:  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  Presented with poor po intake in setting of concomitant ARB and possible bladder outlet obstruction.  Felt to be ATN.  Scr peaked at 8.5 on 09/09/23 but has had excellent UOP following foley catheter placement and IVF's which were stopped yesterday.  Scr has improved to 8.05 with 2.5L of UOP overnight.  No indication for dialysis at this time.  Continue to follow closely.  Continue to hold ARB and hydrochlorothiazide.   Avoid nephrotoxic medications including NSAIDs and iodinated intravenous contrast exposure unless the latter is absolutely indicated.   Preferred narcotic agents for pain control are hydromorphone, fentanyl, and methadone. Morphine should not be used.  Avoid  Baclofen and avoid oral sodium phosphate and magnesium citrate based laxatives / bowel preps.  Continue strict Input and Output monitoring. Will monitor the patient closely with you and intervene or adjust therapy as indicated by changes in clinical status/labs  BOO - s/p foley catheter placement with excellent UOP. Chest pain - elevated troponin.  ECHO with severe LVH and preserved EF and RV function Proteinuria - subnephrotic.  ANA negative, complements WNL, SPEP pending. Anemia - normocytic.  Possibly related to CKD.  TSAT 53%.  No indication for ESA at this time, continue to follow. HTN - stable off of ARB-HCT.   Parkinson's disease - cont with sinemet per primary svc.  Irena Cords, MD [employer] [phone]

## 2023-09-11 NOTE — Progress Notes (Signed)
PROGRESS NOTE    Raymond Burton  GMW:[id number] DOB: Feb 28, 1941 DOA: 09/06/2023 PCP: Renaye Rakers, MD   Brief Narrative:    Raymond Burton is a 82 y.o. male with medical history significant for bradycardia, hypertension, prediabetes, Parkinson's, TIA. Patient was brought to the ED by family with complaints of generalized weakness. 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 Family reports gradual weakness over the past several weeks to months, over the past 3 days it has been more pronounced, with patient lying in bed.  He also reports gradual onset of poor appetite in the same time period, which is different from his baseline.  He has been admitted for AKI versus progression of CKD as well as elevated troponin levels.  Nephrology has ordered further serologies and has continued IV fluid for now.  Appears to be slowly improving.  Assessment & Plan:   Principal Problem:   AKI (acute kidney injury) (HCC) Active Problems:   Elevated troponin   Essential hypertension   Bradycardia   Pre-diabetes   Parkinson disease (HCC)  Assessment and Plan:    AKI (acute kidney injury) (HCC) versus progression of CKD; nonoliguiric Creatinine markedly elevated at 7.82.  Last checked per chart 2018 creatinine was 1.27 with GFR of 64.  Unknown if baseline CKD, acuity of renal dysfunction.  Family reports chronic poor oral intake, he is also on telmisartan HCTZ.  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  -UA with small hemoglobin, 30 protein -Renal ultrasound with no signs of hydronephrosis -Insert Foley for BOO -Appreciate ongoing nephrology evaluation -Hold HCTZ telmisartan (25/80) -Appears to be slowly improving, continue to trend creatinine levels off of IV fluid   Elevated troponin Troponin 458 > 475.  Reports some chest pain previously but is unable to tell when I will give details.  Currently chest pain-free.  EKG without ST or T  wave changes. - EDP talked to cardiology, does not feel that this is very concerning with normal EKG in the setting of significant renal dysfunction, should be okay for patient to stay at [facility].  -Echocardiogram with no wall motion abnormalities and preserved LVEF -IV heparin discontinued 11/29 -Noted to have chest pain overnight on 12/1, but EKG without abnormalities, now chest pain-free.   Parkinson disease [facility]) Follows with neurology.  Baseline dementia, at baseline he can answer simple questions, but on bad days he is barely able to communicate. -Resumed Sinemet   Pre-diabetes - Hgba1c 6.0%   Bradycardia Sinus bradycardia by EKG.  Heart rate 49-63.  History of bradycardia.  Not on rate limiting medications. -TSH normal at 2.05   Essential hypertension Stable. -Currently on Norvasc  Worsening anemia of CKD No indication for ESA Iron panel ordered per nephrology  Constipation-resolved Started MiraLAX and Senokot    DVT prophylaxis: Heparin Code Status: Full Family Communication: Wife present at bedside 12/2 Disposition Plan: Await further IV fluid and decision on hemodialysis Status is: Inpatient Remains inpatient appropriate because: Need for IV fluid.   Consultants:  Nephrology  Procedures:  None  Antimicrobials:  None   Subjective: Patient seen and evaluated today with no new acute complaints or concerns.  Noted to have some left-sided chest pain last night, but EKG negative.  No further chest pain this morning.  He overall appears to be doing much better and is more alert and awake and eating breakfast this morning.   Objective: Vitals:   09/10/23 1404 09/10/23 2100 09/11/23 0002  09/11/23 0410  BP: 97/61 127/65 126/71 133/69  Pulse: 65 71 78 71  Resp: 18 18 18 17   Temp: 98 F (36.7 C)  (!) 97.5 F (36.4 C) 97.6 F (36.4 C)  TempSrc: Oral  Oral Oral  SpO2: 98% 97% 94% 96%  Weight:      Height:        Intake/Output Summary (Last 24 hours) at  09/11/2023 1046 Last data filed at 09/11/2023 1031 Gross per 24 hour  Intake 1080 ml  Output 3500 ml  Net -2420 ml   Filed Weights   09/06/23 1402 09/06/23 2013  Weight: 79.4 kg 77.3 kg    Examination:  General exam: Appears calm and comfortable  Respiratory system: Clear to auscultation. Respiratory effort normal. Cardiovascular system: S1 & S2 heard, RRR.  Gastrointestinal system: Abdomen is soft Central nervous system: Alert and awake Extremities: No edema Skin: No significant lesions noted Psychiatry: Flat affect. Foley with clear, yellow urine output    Data Reviewed: I have personally reviewed following labs and imaging studies  CBC: Recent Labs  Lab 09/06/23 1521 09/08/23 0411 09/09/23 0416  WBC 6.0 5.4 5.0  HGB 11.9* 10.2* 10.0*  HCT 35.8* 29.9* 30.2*  MCV 100.8* 98.7 98.7  PLT 197 193 185   Basic Metabolic Panel: Recent Labs  Lab 09/07/23 0408 09/08/23 0411 09/08/23 1000 09/09/23 0416 09/10/23 0405 09/11/23 0449  NA 141 139  --  137 136 134*  K 3.9 4.1  --  4.0 4.0 4.2  CL 104 103  --  103 101 100  CO2 24 24  --  23 24 22   GLUCOSE 88 92  --  89 91 98  BUN 72* 69*  --  71* 72* 75*  CREATININE 8.02* 8.46*  --  8.50* 8.40* 8.05*  CALCIUM 8.7* 8.5*  --  8.4* 8.4* 8.6*  MG  --  1.9  --  1.8  --   --   PHOS  --   --  4.6 4.9*  --   --    GFR: Estimated Creatinine Clearance: 7.2 mL/min (A) (by C-G formula based on SCr of 8.05 mg/dL (H)). Liver Function Tests: Recent Labs  Lab 09/06/23 1521 09/09/23 0416  AST 26  --   ALT 8  --   ALKPHOS 44  --   BILITOT 0.9  --   PROT 7.2  --   ALBUMIN 3.6 2.7*   No results for input(s): "LIPASE", "AMYLASE" in the last 168 hours. No results for input(s): "AMMONIA" in the last 168 hours. Coagulation Profile: Recent Labs  Lab 09/06/23 1521  INR 1.1   Cardiac Enzymes: No results for input(s): "CKTOTAL", "CKMB", "CKMBINDEX", "TROPONINI" in the last 168 hours. BNP (last 3 results) No results for  input(s): "PROBNP" in the last 8760 hours. HbA1C: No results for input(s): "HGBA1C" in the last 72 hours.  CBG: No results for input(s): "GLUCAP" in the last 168 hours. Lipid Profile: No results for input(s): "CHOL", "HDL", "LDLCALC", "TRIG", "CHOLHDL", "LDLDIRECT" in the last 72 hours. Thyroid Function Tests: No results for input(s): "TSH", "T4TOTAL", "FREET4", "T3FREE", "THYROIDAB" in the last 72 hours.  Anemia Panel: No results for input(s): "VITAMINB12", "FOLATE", "FERRITIN", "TIBC", "IRON", "RETICCTPCT" in the last 72 hours.  Sepsis Labs: No results for input(s): "PROCALCITON", "LATICACIDVEN" in the last 168 hours.  Recent Results (from the past 240 hour(s))  Resp panel by RT-PCR (RSV, Flu A&B, Covid) Anterior Nasal Swab     Status: None   Collection Time: 09/06/23  3:21 PM   Specimen: Anterior Nasal Swab  Result Value Ref Range Status   SARS Coronavirus 2 by RT PCR NEGATIVE NEGATIVE Final    Comment: (NOTE) SARS-CoV-2 target nucleic acids are NOT DETECTED.  The SARS-CoV-2 RNA is generally detectable in upper respiratory specimens during the acute phase of infection. The lowest concentration of SARS-CoV-2 viral copies this assay can detect is 138 copies/mL. A negative result does not preclude SARS-Cov-2 infection and should not be used as the sole basis for treatment or other patient management decisions. A negative result may occur with  improper specimen collection/handling, submission of specimen other than nasopharyngeal swab, presence of viral mutation(s) within the areas targeted by this assay, and inadequate number of viral copies(<138 copies/mL). A negative result must be combined with clinical observations, patient history, and epidemiological information. The expected result is Negative.  Fact Sheet for Patients:  BloggerCourse.com  Fact Sheet for Healthcare Providers:  SeriousBroker.it  This test is no t yet  approved or cleared by the [place] FDA and  has been authorized for detection and/or diagnosis of SARS-CoV-2 by FDA under an Emergency Use Authorization (EUA). This EUA will remain  in effect (meaning this test can be used) for the duration of the COVID-19 declaration under Section 564(b)(1) of the Act, 21 U.S.C.section 360bbb-3(b)(1), unless the authorization is terminated  or revoked sooner.       Influenza A by PCR NEGATIVE NEGATIVE Final   Influenza B by PCR NEGATIVE NEGATIVE Final    Comment: (NOTE) The Xpert Xpress SARS-CoV-2/FLU/RSV plus assay is intended as an aid in the diagnosis of influenza from Nasopharyngeal swab specimens and should not be used as a sole basis for treatment. Nasal washings and aspirates are unacceptable for Xpert Xpress SARS-CoV-2/FLU/RSV testing.  Fact Sheet for Patients: BloggerCourse.com  Fact Sheet for Healthcare Providers: SeriousBroker.it  This test is not yet approved or cleared by the [place] FDA and has been authorized for detection and/or diagnosis of SARS-CoV-2 by FDA under an Emergency Use Authorization (EUA). This EUA will remain in effect (meaning this test can be used) for the duration of the COVID-19 declaration under Section 564(b)(1) of the Act, 21 U.S.C. section 360bbb-3(b)(1), unless the authorization is terminated or revoked.     Resp Syncytial Virus by PCR NEGATIVE NEGATIVE Final    Comment: (NOTE) Fact Sheet for Patients: BloggerCourse.com  Fact Sheet for Healthcare Providers: SeriousBroker.it  This test is not yet approved or cleared by the [place] FDA and has been authorized for detection and/or diagnosis of SARS-CoV-2 by FDA under an Emergency Use Authorization (EUA). This EUA will remain in effect (meaning this test can be used) for the duration of the COVID-19 declaration under Section 564(b)(1) of  the Act, 21 U.S.C. section 360bbb-3(b)(1), unless the authorization is terminated or revoked.  Performed at [facility], [address], [place], [place] [address]   Surgical PCR screen     Status: None   Collection Time: 09/07/23 10:45 PM   Specimen: Nasal Mucosa; Nasal Swab  Result Value Ref Range Status   MRSA, PCR NEGATIVE NEGATIVE Final   Staphylococcus aureus NEGATIVE NEGATIVE Final    Comment: (NOTE) The Xpert SA Assay (FDA approved for NASAL specimens in patients 29 years of age and older), is one component of a comprehensive surveillance program. It is not intended to diagnose infection nor to guide or monitor treatment. Performed at [facility], [address] [place], [place] [address]  Radiology Studies: No results found.      Scheduled Meds:  amLODipine  10 mg Oral Daily   aspirin EC  81 mg Oral Daily   atorvastatin  40 mg Oral Daily   Chlorhexidine Gluconate Cloth  6 each Topical Daily   polyethylene glycol  17 g Oral Daily   senna-docusate  1 tablet Oral BID   tamsulosin  0.4 mg Oral Daily     LOS: 5 days    Time spent: 35 minutes    Esau Fridman Hoover Brunette, DO [facility]  If 7PM-7AM, please contact night-coverage [email or web address] 09/11/2023, 10:46 AM

## 2023-09-11 NOTE — Progress Notes (Signed)
Mobility Specialist Progress Note:    09/11/23 1035  Mobility  Activity Ambulated with assistance in hallway  Level of Assistance Contact guard assist, steadying assist  Assistive Device Front wheel walker  Distance Ambulated (ft) 150 ft  Range of Motion/Exercises Active;All extremities  Activity Response Tolerated well  Mobility Referral Yes  $Mobility charge 1 Mobility  Mobility Specialist Start Time (ACUTE ONLY) 1010  Mobility Specialist Stop Time (ACUTE ONLY) 1030  Mobility Specialist Time Calculation (min) (ACUTE ONLY) 20 min   Pt received standing with NT. Agreeable to further mobility, required CGA to stand and ambulate with RW. Tolerated well, asx throughout. Returned to room, left in chair. Alarm on, call bell in reach. All needs met.   Lawerance Bach Mobility Specialist Please contact via Special educational needs teacher or  Rehab office at [phone]

## 2023-09-11 NOTE — Plan of Care (Signed)
°  Problem: Health Behavior/Discharge Planning: Goal: Ability to manage health-related needs will improve Outcome: Progressing   Problem: Clinical Measurements: Goal: Ability to maintain clinical measurements within normal limits will improve Outcome: Progressing   Problem: Clinical Measurements: Goal: Will remain free from infection Outcome: Progressing   Problem: Clinical Measurements: Goal: Diagnostic test results will improve Outcome: Progressing   Problem: Nutrition: Goal: Adequate nutrition will be maintained Outcome: Progressing

## 2023-09-12 DIAGNOSIS — N179 Acute kidney failure, unspecified: Secondary | ICD-10-CM | POA: Diagnosis not present

## 2023-09-12 LAB — RENAL FUNCTION PANEL
Albumin: 2.8 g/dL — ABNORMAL LOW (ref 3.5–5.0)
Anion gap: 12 (ref 5–15)
BUN: 76 mg/dL — ABNORMAL HIGH (ref 8–23)
CO2: 22 mmol/L (ref 22–32)
Calcium: 8.6 mg/dL — ABNORMAL LOW (ref 8.9–10.3)
Chloride: 97 mmol/L — ABNORMAL LOW (ref 98–111)
Creatinine, Ser: 8.05 mg/dL — ABNORMAL HIGH (ref 0.61–1.24)
GFR, Estimated: 6 mL/min — ABNORMAL LOW (ref >60)
Glucose, Bld: 92 mg/dL (ref 70–99)
Phosphorus: 5.2 mg/dL — ABNORMAL HIGH (ref 2.5–4.6)
Potassium: 3.8 mmol/L (ref 3.5–5.1)
Sodium: 131 mmol/L — ABNORMAL LOW (ref 135–145)

## 2023-09-12 LAB — ANCA PROFILE
Anti-MPO Antibodies: <0.2 U (ref 0.0–0.9)
Anti-PR3 Antibodies: <0.2 U (ref 0.0–0.9)
Atypical P-ANCA titer: <1:20 {titer}
C-ANCA: <1:20 {titer}
P-ANCA: <1:20 {titer}

## 2023-09-12 MED ORDER — LACTATED RINGERS IV SOLN: INTRAVENOUS | Status: AC

## 2023-09-12 NOTE — Progress Notes (Signed)
Mobility Specialist Progress Note:    09/12/23 1053  Mobility  Activity Ambulated with assistance in hallway;Transferred from bed to chair  Level of Assistance Contact guard assist, steadying assist  Assistive Device Front wheel walker  Distance Ambulated (ft) 150 ft  Range of Motion/Exercises Active;All extremities  Activity Response Tolerated well  Mobility Referral Yes  $Mobility charge 1 Mobility  Mobility Specialist Start Time (ACUTE ONLY) 1000  Mobility Specialist Stop Time (ACUTE ONLY) 1030  Mobility Specialist Time Calculation (min) (ACUTE ONLY) 30 min   Pt received in bed, wife in room. Agreeable to mobility, required CGA to stand and ambulate with RW. Tolerated well, asx throughout. Returned to room, left in chair. Alarm on, NT at bedside. All needs met.   Lawerance Bach Mobility Specialist Please contact via Special educational needs teacher or  Rehab office at [phone]

## 2023-09-12 NOTE — Progress Notes (Signed)
Patient ID: Raymond Burton, male   DOB: 1940-12-12, 82 y.o.   MRN: [id number]   Subjective: He had 3 liters UOP over 12/2.  He has not been on continuous fluids over the past 24 hours.  Serum creatinine this AM is essentially unchanged.  His wife is at bedside.  She states that at baseline he sometimes has to take his time answering but she reports that he will generally come up with the correct answer.  His family would defer to his decision about dialysis.  One of their sons is coming later this week and they are meeting to discuss.  His wife reached out to his PCP and Cr was 1.3 in 05/2023.  Presented with poor po intake in setting of   Review of systems:   No more chest pain  Denies shortness of breath  Confusion last night and night before - ended after 1- 1.5 hours  O:BP 121/67 (BP Location: Right Arm)   Pulse 71   Temp 98 F (36.7 C) (Oral)   Resp 19   Ht 5\' 9"  (1.753 m)   Wt 77.3 kg   SpO2 99%   BMI 25.17 kg/m   Intake/Output Summary (Last 24 hours) at 09/12/2023 1006 Last data filed at 09/12/2023 0955 Gross per 24 hour  Intake 480 ml  Output 3000 ml  Net -2520 ml   Intake/Output: I/O last 3 completed shifts: In: 480 [P.O.:480] Out: 4800 [Urine:4800]  Intake/Output this shift:  Total I/O In: 240 [P.O.:240] Out: -  Weight change:   General adult male in chair in no acute distress HEENT normocephalic atraumatic extraocular movements intact sclera anicteric Neck supple trachea midline Lungs clear to auscultation bilaterally normal work of breathing at rest on room air Heart S1S2 no rub Abdomen soft nontender nondistended Extremities no edema  Psych normal mood and affect Neuro - oriented to person.  Cannot provide year or location today  Recent Labs  Lab 09/06/23 1521 09/07/23 0408 09/08/23 0411 09/08/23 1000 09/09/23 0416 09/10/23 0405 09/11/23 0449 09/12/23 0404  NA 141 141 139  --  137 136 134* 131*  K 4.1 3.9 4.1  --  4.0 4.0 4.2 3.8  CL 103 104 103  --   103 101 100 97*  CO2 27 24 24   --  23 24 22 22   GLUCOSE 107* 88 92  --  89 91 98 92  BUN 73* 72* 69*  --  71* 72* 75* 76*  CREATININE 7.82* 8.02* 8.46*  --  8.50* 8.40* 8.05* 8.05*  ALBUMIN 3.6  --   --   --  2.7*  --   --  2.8*  CALCIUM 9.3 8.7* 8.5*  --  8.4* 8.4* 8.6* 8.6*  PHOS  --   --   --  4.6 4.9*  --   --  5.2*  AST 26  --   --   --   --   --   --   --   ALT 8  --   --   --   --   --   --   --    Liver Function Tests: Recent Labs  Lab 09/06/23 1521 09/09/23 0416 09/12/23 0404  AST 26  --   --   ALT 8  --   --   ALKPHOS 44  --   --   BILITOT 0.9  --   --   PROT 7.2  --   --   ALBUMIN 3.6 2.7* 2.8*  No results for input(s): "LIPASE", "AMYLASE" in the last 168 hours. No results for input(s): "AMMONIA" in the last 168 hours. CBC: Recent Labs  Lab 09/06/23 1521 09/08/23 0411 09/09/23 0416  WBC 6.0 5.4 5.0  HGB 11.9* 10.2* 10.0*  HCT 35.8* 29.9* 30.2*  MCV 100.8* 98.7 98.7  PLT 197 193 185   Cardiac Enzymes: No results for input(s): "CKTOTAL", "CKMB", "CKMBINDEX", "TROPONINI" in the last 168 hours. CBG: No results for input(s): "GLUCAP" in the last 168 hours.  Iron Studies:  No results for input(s): "IRON", "TIBC", "TRANSFERRIN", "FERRITIN" in the last 72 hours.  Studies/Results: No results found.  amLODipine  10 mg Oral Daily   aspirin EC  81 mg Oral Daily   atorvastatin  40 mg Oral Daily   Chlorhexidine Gluconate Cloth  6 each Topical Daily   polyethylene glycol  17 g Oral Daily   senna-docusate  1 tablet Oral BID   tamsulosin  0.4 mg Oral Daily    BMET    Component Value Date/Time   NA 131 (L) 09/12/2023 0404   K 3.8 09/12/2023 0404   CL 97 (L) 09/12/2023 0404   CO2 22 09/12/2023 0404   GLUCOSE 92 09/12/2023 0404   BUN 76 (H) 09/12/2023 0404   CREATININE 8.05 (H) 09/12/2023 0404   CALCIUM 8.6 (L) 09/12/2023 0404   GFRNONAA 6 (L) 09/12/2023 0404   CBC    Component Value Date/Time   WBC 5.0 09/09/2023 0416   RBC 3.06 (L) 09/09/2023 0416    HGB 10.0 (L) 09/09/2023 0416   HCT 30.2 (L) 09/09/2023 0416   PLT 185 09/09/2023 0416   MCV 98.7 09/09/2023 0416   MCH 32.7 09/09/2023 0416   MCHC 33.1 09/09/2023 0416   RDW 12.5 09/09/2023 0416     Assessment/Plan:  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  His wife reached out to his PCP and Cr was 1.3 in 05/2023.  Presented with poor po intake in setting of concomitant ARB and possible bladder outlet obstruction.  Felt to be ATN.  Scr peaked at 8.5 on 09/09/23 but has had excellent UOP following foley catheter placement and IVF's which were stopped yesterday.  Scr has improved to 8.05 with 2.5L of UOP overnight.  No indication for dialysis at this time.  Continue to follow closely.  Continue to hold ARB and hydrochlorothiazide.   Baseline functional status is good.  His niece is a nephrologist at [employer].  She was very supportive of any decisions that her aunt and uncle may make regarding dialysis in the future (and if they do decide to pursue dialysis she felt that they would be a great candidate for urgent start PD rather than hemo (available at [facility] vs here if can be arranged).)  Strict ins/outs BOO - per charting. s/p foley catheter placement with excellent UOP.  On flomax (here and at home) Chest pain - elevated troponin.  ECHO with severe LVH and preserved EF and RV function.  Per primary team.  I see an order for cardiology consult but not a note.   Proteinuria - subnephrotic.  ANA negative, complements WNL, SPEP pending.  Note that his free light chains ratio is markedly low.  ANCA pending. HIV and hepatitis negative.   Abnormal free light chains ratio - would recommend hem/onc evaluation. Await SPEP Anemia - normocytic.  Possibly related to CKD.  TSAT 53%.  No indication for ESA at this time, continue to follow. CBC  in AM  HTN - stable off of ARB-hydrochlorothiazide.  Parkinson's disease - cont with sinemet per primary team.  Per the patient's  wife, some days he will take his time but he will most often come up with the correct answer to questions   Disposition - continue inpatient monitoring  Estanislado Emms, MD 10:56 AM 09/12/2023

## 2023-09-12 NOTE — Plan of Care (Signed)
  Problem: Health Behavior/Discharge Planning: Goal: Ability to manage health-related needs will improve Outcome: Progressing   Problem: Clinical Measurements: Goal: Ability to maintain clinical measurements within normal limits will improve Outcome: Progressing   Problem: Clinical Measurements: Goal: Will remain free from infection Outcome: Progressing   Problem: Clinical Measurements: Goal: Diagnostic test results will improve Outcome: Progressing   Problem: Clinical Measurements: Goal: Cardiovascular complication will be avoided Outcome: Progressing

## 2023-09-12 NOTE — Progress Notes (Signed)
PROGRESS NOTE    Raymond Burton  EAV:[id number] DOB: [birth year] DOA: 09/06/2023 PCP: Renaye Rakers, MD   Brief Narrative:    Raymond Burton is a 82 y.o. male with medical history significant for bradycardia, hypertension, prediabetes, Parkinson's, TIA. Patient was brought to the ED by family with complaints of generalized weakness. 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 Family reports gradual weakness over the past several weeks to months, over the past 3 days it has been more pronounced, with patient lying in bed.  He also reports gradual onset of poor appetite in the same time period, which is different from his baseline.  He has been admitted for AKI versus progression of CKD as well as elevated troponin levels.  Nephrology has ordered further serologies and has continued IV fluid for now.  Appears to be slowly improving.  Continue to be followed by nephrology and restarted on IV fluid today.  Assessment & Plan:   Principal Problem:   AKI (acute kidney injury) (HCC) Active Problems:   Elevated troponin   Essential hypertension   Bradycardia   Pre-diabetes   Parkinson disease (HCC)  Assessment and Plan:    AKI (acute kidney injury) (HCC) versus progression of CKD; nonoliguiric Family reports chronic poor oral intake, he is also on telmisartan HCTZ.  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  -UA with small hemoglobin, 30 protein -Renal ultrasound with no signs of hydronephrosis -Insert Foley for BOO -Appreciate ongoing nephrology evaluation -Hold HCTZ telmisartan (25/80) -Appears to be slowly improving, continue to trend creatinine with restart of IV fluid -Appears that creatinine was 1.3 per PCP on 05/2023   Elevated troponin Troponin 458 > 475.  Reports some chest pain previously but is unable to tell when I will give details.  Currently chest pain-free.  EKG without ST or T wave changes. - EDP talked to  cardiology, does not feel that this is very concerning with normal EKG in the setting of significant renal dysfunction, should be okay for patient to stay at [facility].  -Echocardiogram with no wall motion abnormalities and preserved LVEF -IV heparin discontinued 11/29 -Noted to have chest pain overnight on 12/1, but EKG without abnormalities, now chest pain-free.   Parkinson disease [facility]) Follows with neurology.  Baseline dementia, at baseline he can answer simple questions, but on bad days he is barely able to communicate. -Resumed Sinemet   Pre-diabetes - Hgba1c 6.0%   Bradycardia Sinus bradycardia by EKG.  Heart rate 49-63.  History of bradycardia.  Not on rate limiting medications. -TSH normal at 2.05   Essential hypertension Stable. -Currently on Norvasc  Worsening anemia of CKD No indication for ESA Iron panel ordered per nephrology  Constipation-resolved Started MiraLAX and Senokot    DVT prophylaxis: Heparin Code Status: Full Family Communication: Wife present at bedside 12/3 Disposition Plan: Await further IV fluid and decision on hemodialysis Status is: Inpatient Remains inpatient appropriate because: Need for IV fluid.   Consultants:  Nephrology  Procedures:  None  Antimicrobials:  None   Subjective: Patient seen and evaluated today with no new acute complaints or concerns.  He seems to have some mild agitation at nighttime, but this quickly resolves.  He seems to be eating a little bit better, but creatinine remains unchanged.  Objective: Vitals:   09/11/23 0410 09/11/23 1435 09/11/23 2014 09/12/23 0357  BP: 133/69 (!) 99/59 118/61 121/67  Pulse: 71 90 67 71  Resp: 17 20  18 19  Temp: 97.6 F (36.4 C) 98 F (36.7 C) 98 F (36.7 C) 98 F (36.7 C)  TempSrc: Oral Oral Oral Oral  SpO2: 96% 98% 98% 99%  Weight:      Height:        Intake/Output Summary (Last 24 hours) at 09/12/2023 1147 Last data filed at 09/12/2023 1018 Gross per 24 hour   Intake 480 ml  Output 3300 ml  Net -2820 ml   Filed Weights   09/06/23 1402 09/06/23 2013  Weight: 79.4 kg 77.3 kg    Examination:  General exam: Appears calm and comfortable  Respiratory system: Clear to auscultation. Respiratory effort normal. Cardiovascular system: S1 & S2 heard, RRR.  Gastrointestinal system: Abdomen is soft Central nervous system: Alert and awake Extremities: No edema Skin: No significant lesions noted Psychiatry: Flat affect. Foley with clear, yellow urine output    Data Reviewed: I have personally reviewed following labs and imaging studies  CBC: Recent Labs  Lab 09/06/23 1521 09/08/23 0411 09/09/23 0416  WBC 6.0 5.4 5.0  HGB 11.9* 10.2* 10.0*  HCT 35.8* 29.9* 30.2*  MCV 100.8* 98.7 98.7  PLT 197 193 185   Basic Metabolic Panel: Recent Labs  Lab 09/08/23 0411 09/08/23 1000 09/09/23 0416 09/10/23 0405 09/11/23 0449 09/12/23 0404  NA 139  --  137 136 134* 131*  K 4.1  --  4.0 4.0 4.2 3.8  CL 103  --  103 101 100 97*  CO2 24  --  23 24 22 22   GLUCOSE 92  --  89 91 98 92  BUN 69*  --  71* 72* 75* 76*  CREATININE 8.46*  --  8.50* 8.40* 8.05* 8.05*  CALCIUM 8.5*  --  8.4* 8.4* 8.6* 8.6*  MG 1.9  --  1.8  --   --   --   PHOS  --  4.6 4.9*  --   --  5.2*   GFR: Estimated Creatinine Clearance: 7.2 mL/min (A) (by C-G formula based on SCr of 8.05 mg/dL (H)). Liver Function Tests: Recent Labs  Lab 09/06/23 1521 09/09/23 0416 09/12/23 0404  AST 26  --   --   ALT 8  --   --   ALKPHOS 44  --   --   BILITOT 0.9  --   --   PROT 7.2  --   --   ALBUMIN 3.6 2.7* 2.8*   No results for input(s): "LIPASE", "AMYLASE" in the last 168 hours. No results for input(s): "AMMONIA" in the last 168 hours. Coagulation Profile: Recent Labs  Lab 09/06/23 1521  INR 1.1   Cardiac Enzymes: No results for input(s): "CKTOTAL", "CKMB", "CKMBINDEX", "TROPONINI" in the last 168 hours. BNP (last 3 results) No results for input(s): "PROBNP" in the last  8760 hours. HbA1C: No results for input(s): "HGBA1C" in the last 72 hours.  CBG: No results for input(s): "GLUCAP" in the last 168 hours. Lipid Profile: No results for input(s): "CHOL", "HDL", "LDLCALC", "TRIG", "CHOLHDL", "LDLDIRECT" in the last 72 hours. Thyroid Function Tests: No results for input(s): "TSH", "T4TOTAL", "FREET4", "T3FREE", "THYROIDAB" in the last 72 hours.  Anemia Panel: No results for input(s): "VITAMINB12", "FOLATE", "FERRITIN", "TIBC", "IRON", "RETICCTPCT" in the last 72 hours.  Sepsis Labs: No results for input(s): "PROCALCITON", "LATICACIDVEN" in the last 168 hours.  Recent Results (from the past 240 hour(s))  Resp panel by RT-PCR (RSV, Flu A&B, Covid) Anterior Nasal Swab     Status: None   Collection Time: 09/06/23  3:21 PM   Specimen: Anterior Nasal Swab  Result Value Ref Range Status   SARS Coronavirus 2 by RT PCR NEGATIVE NEGATIVE Final    Comment: (NOTE) SARS-CoV-2 target nucleic acids are NOT DETECTED.  The SARS-CoV-2 RNA is generally detectable in upper respiratory specimens during the acute phase of infection. The lowest concentration of SARS-CoV-2 viral copies this assay can detect is 138 copies/mL. A negative result does not preclude SARS-Cov-2 infection and should not be used as the sole basis for treatment or other patient management decisions. A negative result may occur with  improper specimen collection/handling, submission of specimen other than nasopharyngeal swab, presence of viral mutation(s) within the areas targeted by this assay, and inadequate number of viral copies(<138 copies/mL). A negative result must be combined with clinical observations, patient history, and epidemiological information. The expected result is Negative.  Fact Sheet for Patients:  BloggerCourse.com  Fact Sheet for Healthcare Providers:  SeriousBroker.it  This test is no t yet approved or cleared by the  [place] FDA and  has been authorized for detection and/or diagnosis of SARS-CoV-2 by FDA under an Emergency Use Authorization (EUA). This EUA will remain  in effect (meaning this test can be used) for the duration of the COVID-19 declaration under Section 564(b)(1) of the Act, 21 U.S.C.section 360bbb-3(b)(1), unless the authorization is terminated  or revoked sooner.       Influenza A by PCR NEGATIVE NEGATIVE Final   Influenza B by PCR NEGATIVE NEGATIVE Final    Comment: (NOTE) The Xpert Xpress SARS-CoV-2/FLU/RSV plus assay is intended as an aid in the diagnosis of influenza from Nasopharyngeal swab specimens and should not be used as a sole basis for treatment. Nasal washings and aspirates are unacceptable for Xpert Xpress SARS-CoV-2/FLU/RSV testing.  Fact Sheet for Patients: BloggerCourse.com  Fact Sheet for Healthcare Providers: SeriousBroker.it  This test is not yet approved or cleared by the [place] FDA and has been authorized for detection and/or diagnosis of SARS-CoV-2 by FDA under an Emergency Use Authorization (EUA). This EUA will remain in effect (meaning this test can be used) for the duration of the COVID-19 declaration under Section 564(b)(1) of the Act, 21 U.S.C. section 360bbb-3(b)(1), unless the authorization is terminated or revoked.     Resp Syncytial Virus by PCR NEGATIVE NEGATIVE Final    Comment: (NOTE) Fact Sheet for Patients: BloggerCourse.com  Fact Sheet for Healthcare Providers: SeriousBroker.it  This test is not yet approved or cleared by the [place] FDA and has been authorized for detection and/or diagnosis of SARS-CoV-2 by FDA under an Emergency Use Authorization (EUA). This EUA will remain in effect (meaning this test can be used) for the duration of the COVID-19 declaration under Section 564(b)(1) of the Act, 21 U.S.C. section  360bbb-3(b)(1), unless the authorization is terminated or revoked.  Performed at [facility], [address] [place], [place] [address]   Surgical PCR screen     Status: None   Collection Time: 09/07/23 10:45 PM   Specimen: Nasal Mucosa; Nasal Swab  Result Value Ref Range Status   MRSA, PCR NEGATIVE NEGATIVE Final   Staphylococcus aureus NEGATIVE NEGATIVE Final    Comment: (NOTE) The Xpert SA Assay (FDA approved for NASAL specimens in patients 72 years of age and older), is one component of a comprehensive surveillance program. It is not intended to diagnose infection nor to guide or monitor treatment. Performed at [facility], [address], [place], [place] [address]  Radiology Studies: No results found.      Scheduled Meds:  amLODipine  10 mg Oral Daily   aspirin EC  81 mg Oral Daily   atorvastatin  40 mg Oral Daily   Chlorhexidine Gluconate Cloth  6 each Topical Daily   polyethylene glycol  17 g Oral Daily   senna-docusate  1 tablet Oral BID   tamsulosin  0.4 mg Oral Daily     LOS: 6 days    Time spent: 35 minutes    Misheel Gowans Hoover Brunette, DO [facility]  If 7PM-7AM, please contact night-coverage [email or web address] 09/12/2023, 11:47 AM

## 2023-09-13 DIAGNOSIS — I10 Essential (primary) hypertension: Secondary | ICD-10-CM | POA: Diagnosis not present

## 2023-09-13 DIAGNOSIS — G20A1 Parkinson's disease without dyskinesia, without mention of fluctuations: Secondary | ICD-10-CM | POA: Diagnosis not present

## 2023-09-13 DIAGNOSIS — R001 Bradycardia, unspecified: Secondary | ICD-10-CM | POA: Diagnosis not present

## 2023-09-13 DIAGNOSIS — N179 Acute kidney failure, unspecified: Secondary | ICD-10-CM | POA: Diagnosis not present

## 2023-09-13 LAB — PROTEIN ELECTROPHORESIS, SERUM
A/G Ratio: 1 (ref 0.7–1.7)
Albumin ELP: 3 g/dL (ref 2.9–4.4)
Alpha-1-Globulin: 0.3 g/dL (ref 0.0–0.4)
Alpha-2-Globulin: 0.7 g/dL (ref 0.4–1.0)
Beta Globulin: 1.6 g/dL — ABNORMAL HIGH (ref 0.7–1.3)
Gamma Globulin: 0.4 g/dL (ref 0.4–1.8)
Globulin, Total: 3 g/dL (ref 2.2–3.9)
M-Spike, %: 0.7 g/dL — ABNORMAL HIGH
Total Protein ELP: 6 g/dL (ref 6.0–8.5)

## 2023-09-13 LAB — RENAL FUNCTION PANEL
Albumin: 3 g/dL — ABNORMAL LOW (ref 3.5–5.0)
Anion gap: 12 (ref 5–15)
BUN: 76 mg/dL — ABNORMAL HIGH (ref 8–23)
CO2: 23 mmol/L (ref 22–32)
Calcium: 8.8 mg/dL — ABNORMAL LOW (ref 8.9–10.3)
Chloride: 100 mmol/L (ref 98–111)
Creatinine, Ser: 8.19 mg/dL — ABNORMAL HIGH (ref 0.61–1.24)
GFR, Estimated: 6 mL/min — ABNORMAL LOW (ref >60)
Glucose, Bld: 103 mg/dL — ABNORMAL HIGH (ref 70–99)
Phosphorus: 5.2 mg/dL — ABNORMAL HIGH (ref 2.5–4.6)
Potassium: 3.8 mmol/L (ref 3.5–5.1)
Sodium: 135 mmol/L (ref 135–145)

## 2023-09-13 LAB — CBC
HCT: 30.8 % — ABNORMAL LOW (ref 39.0–52.0)
Hemoglobin: 10.5 g/dL — ABNORMAL LOW (ref 13.0–17.0)
MCH: 33 pg (ref 26.0–34.0)
MCHC: 34.1 g/dL (ref 30.0–36.0)
MCV: 96.9 fL (ref 80.0–100.0)
Platelets: 211 10*3/uL (ref 150–400)
RBC: 3.18 MIL/uL — ABNORMAL LOW (ref 4.22–5.81)
RDW: 12.4 % (ref 11.5–15.5)
WBC: 7.2 10*3/uL (ref 4.0–10.5)
nRBC: 0 % (ref 0.0–0.2)

## 2023-09-13 LAB — MAGNESIUM: Magnesium: 1.8 mg/dL (ref 1.7–2.4)

## 2023-09-13 MED ORDER — ALUM & MAG HYDROXIDE-SIMETH 200-200-20 MG/5ML PO SUSP
15.0000 mL | Freq: Four times a day (QID) | ORAL | Status: DC | PRN
  Administered 2023-09-13 – 2023-09-14 (×3): 15 mL via ORAL
  Filled 2023-09-13 (×3): qty 30

## 2023-09-13 MED ORDER — PANTOPRAZOLE SODIUM 40 MG PO TBEC
40.0000 mg | DELAYED_RELEASE_TABLET | Freq: Every day | ORAL | Status: DC
Start: 2023-09-13 — End: 2023-09-20
  Administered 2023-09-13 – 2023-09-19 (×7): 40 mg via ORAL
  Filled 2023-09-13 (×7): qty 1

## 2023-09-13 NOTE — Progress Notes (Signed)
Physical Therapy Treatment Patient Details Name: Raymond Burton MRN: [id number] DOB: 09-18-1941 Today's Date: 09/13/2023   History of Present Illness Raymond Burton is a 82 y/o male. Family reports gradual weakness over the past several weeks to months, over the past 3 days it has been more pronounced, with patient lying in bed.  He also reports gradual onset of poor appetite in the same time period, which is different from his baseline.  He denies any pain currently, but reports some mild chest pain a while ago- which he had not mentioned to family previously, he is unable to give further details.  Per family he has been voiding okay.  Reports some nasal congestion recently that appears to have improved.  No difficulty breathing.  No leg swelling.  No vomiting no diarrhea    PT Comments  Pt tolerated today's treatment session, well with great carryover for functional training and strengthening. Today's session addressed improving BLE muscle strength for transfers and activity tolerance for ADLs. Pt noted with continued mild balance reaction deficits due to muscle weakness and PMH. Pt would continue to benefit from skilled acute physical therapy services in order to progress toward POC goals, safety/independence with functional mobility and QOL.     If plan is discharge home, recommend the following: A little help with walking and/or transfers;A little help with bathing/dressing/bathroom;Help with stairs or ramp for entrance   Can travel by private vehicle        Equipment Recommendations  None recommended by PT    Recommendations for Other Services       Precautions / Restrictions Precautions Precautions: Fall Restrictions Weight Bearing Restrictions: No     Mobility  Bed Mobility               General bed mobility comments: Pt in incliner. Patient Response: Cooperative  Transfers Overall transfer level: Needs assistance   Transfers: Sit to/from Stand Sit to Stand:  Supervision           General transfer comment: 8x sit/stand with BUE support at supervision level for funcitonal strengthening. Steady, slow movement patterns.    Ambulation/Gait Ambulation/Gait assistance: Supervision Gait Distance (Feet): 150 Feet   Gait Pattern/deviations: Step-through pattern, Decreased stride length       General Gait Details: ambulating with/ AD at supervision level with steady reactions within busy environment.   Stairs             Wheelchair Mobility     Tilt Bed Tilt Bed Patient Response: Cooperative  Modified Rankin (Stroke Patients Only)       Balance Overall balance assessment: Needs assistance   Sitting balance-Leahy Scale: Good     Standing balance support: No upper extremity supported, During functional activity Standing balance-Leahy Scale: Good Standing balance comment: good/good with and without AD                            Cognition Arousal: Alert Behavior During Therapy: WFL for tasks assessed/performed Overall Cognitive Status: History of cognitive impairments - at baseline                                          Exercises General Exercises - Lower Extremity Long Arc Quad: AROM, 10 reps, Both, Strengthening Other Exercises Other Exercises: 8x sit/stand from recliner for functional BLE strengthening. Other Exercises: 3 x 20  standing marches without AD.    General Comments        Pertinent Vitals/Pain Pain Assessment Pain Assessment: No/denies pain    Home Living                          Prior Function            PT Goals (current goals can now be found in the care plan section) Acute Rehab PT Goals Patient Stated Goal: return home PT Goal Formulation: With patient Time For Goal Achievement: 09/22/23 Potential to Achieve Goals: Good Progress towards PT goals: Progressing toward goals    Frequency    Min 3X/week      PT Plan      Co-evaluation      PT goals addressed during session: Mobility/safety with mobility;Balance;Strengthening/ROM        AM-PAC PT "6 Clicks" Mobility   Outcome Measure  Help needed turning from your back to your side while in a flat bed without using bedrails?: None Help needed moving from lying on your back to sitting on the side of a flat bed without using bedrails?: None Help needed moving to and from a bed to a chair (including a wheelchair)?: None Help needed standing up from a chair using your arms (e.g., wheelchair or bedside chair)?: None Help needed to walk in hospital room?: A Little Help needed climbing 3-5 steps with a railing? : A Lot 6 Click Score: 21    End of Session Equipment Utilized During Treatment: Gait belt Activity Tolerance: Patient tolerated treatment well Patient left: in chair;with call bell/phone within reach;with chair alarm set;with family/visitor present Nurse Communication: Mobility status PT Visit Diagnosis: Unsteadiness on feet (R26.81);Other abnormalities of gait and mobility (R26.89);Muscle weakness (generalized) (M62.81)     Time: 1610-9604 PT Time Calculation (min) (ACUTE ONLY): 16 min  Charges:    $Therapeutic Activity: 8-22 mins PT General Charges $$ ACUTE PT VISIT: 1 Visit                     Nelida Meuse PT, DPT Physical Therapist with Raymond Burton [facility] [phone] office   Nelida Meuse 09/13/2023, 12:27 PM

## 2023-09-13 NOTE — Plan of Care (Signed)
  Problem: Health Behavior/Discharge Planning: Goal: Ability to manage health-related needs will improve Outcome: Progressing   Problem: Clinical Measurements: Goal: Ability to maintain clinical measurements within normal limits will improve Outcome: Progressing Goal: Will remain free from infection Outcome: Progressing Goal: Diagnostic test results will improve Outcome: Progressing Goal: Cardiovascular complication will be avoided Outcome: Progressing   Problem: Nutrition: Goal: Adequate nutrition will be maintained Outcome: Progressing

## 2023-09-13 NOTE — Plan of Care (Signed)
  Problem: Health Behavior/Discharge Planning: Goal: Ability to manage health-related needs will improve Outcome: Progressing   Problem: Clinical Measurements: Goal: Ability to maintain clinical measurements within normal limits will improve Outcome: Progressing   Problem: Clinical Measurements: Goal: Will remain free from infection Outcome: Progressing   Problem: Clinical Measurements: Goal: Diagnostic test results will improve Outcome: Progressing   Problem: Clinical Measurements: Goal: Cardiovascular complication will be avoided Outcome: Progressing

## 2023-09-13 NOTE — Hospital Course (Addendum)
82 y.o. male with medical history significant for bradycardia, hypertension, prediabetes, Parkinson's, TIA.  Patient was brought to the ED by family with complaints of generalized weakness. 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  Family reports gradual weakness over the past several weeks to months, over the past 3 days it has been more pronounced, with patient lying in bed.  He also reports gradual onset of poor appetite in the same time period, which is different from his baseline.  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  He is on IV heparin for stroke prevention due to likely upcoming procedures for PD start up.  Nephrology has ordered further serologies.  Continues to be followed by nephrology and currently waiting for bed at [facility] with tentative plan for urgent PD start up.

## 2023-09-13 NOTE — Progress Notes (Signed)
PROGRESS NOTE   Raymond Burton  XBM:[id number] DOB: [birth year] DOA: 09/06/2023 PCP: Renaye Rakers, MD   Chief Complaint  Patient presents with   Fatigue   Level of care: Telemetry  Brief Admission History:  82 y.o. male with medical history significant for bradycardia, hypertension, prediabetes, Parkinson's, TIA.  Patient was brought to the ED by family with complaints of generalized weakness. 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  Family reports gradual weakness over the past several weeks to months, over the past 3 days it has been more pronounced, with patient lying in bed.  He also reports gradual onset of poor appetite in the same time period, which is different from his baseline.  He has been admitted for AKI versus progression of CKD as well as elevated troponin levels.  Nephrology has ordered further serologies and has continued IV fluid for now.  Appears to be slowly improving.  Continue to be followed by nephrology and restarted on IV fluid.   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  -UA with small hemoglobin, 30 protein -Renal ultrasound with no signs of hydronephrosis -Inserted Foley for BOO, now urine freely flowing -Appreciate ongoing nephrology evaluation -Held hydrochlorothiazide/telmisartan (25/80) -Appears that creatinine was 1.3 per PCP on 05/2023 -nephrology following closely for renal recovery   Elevated troponin Troponin 458 > 475    - Currently chest pain-free.  EKG without ST or T wave changes. - EDP talked to cardiology, does not feel that this is very concerning with normal EKG in the setting of significant renal dysfunction, should be okay for patient to stay at [facility].  -Echocardiogram with no wall motion abnormalities and preserved LVEF -IV heparin discontinued 11/29    Parkinson disease Follows with neurology.  Baseline dementia, at baseline he can answer simple questions, but on bad days he is barely able to communicate. -Resumed Sinemet   GERD - pantoprazole ordered for GI protection  Pre-diabetes - Hgba1c 6.0% - diet controlled   Bradycardia Sinus bradycardia by EKG.  Heart rate 49-63.  History of bradycardia.  Not on rate limiting medications. -TSH normal at 2.05   Essential hypertension -controlled  -Currently on amlodipine   Worsening anemia of CKD No indication for ESA Iron panel ordered per nephrology   Constipation-resolved Started MiraLAX and Senokot    DVT prophylaxis: SCDs Code Status: Full Family Communication: Wife present at bedside 12/4, 12/5 Disposition Plan: Await nephrology decision on hemodialysis vs outpatient surveillance  Consultants:  Nephrology  Procedures:   Antimicrobials:    Subjective: Pt has indigestion symptoms when lying flat but feels better with sitting up in chair.   Objective: Vitals:   09/12/23 1434 09/12/23 2023 09/13/23 0411 09/13/23 1447  BP: 91/61 106/63 128/68 (!) 100/56  Pulse: 63 62 79 68  Resp: 20 17 17 17   Temp: 98.8 F (37.1 C) 98.7 F (37.1 C) 98 F (36.7 C) 97.9 F (36.6 C)  TempSrc: Oral Oral Oral Oral  SpO2: 97% 97% 97% 95%  Weight:      Height:        Intake/Output Summary (Last 24 hours) at 09/13/2023 1647 Last data filed at 09/13/2023 1500 Gross per 24 hour  Intake 2204.34 ml  Output 1500 ml  Net 704.34 ml   Filed Weights   09/06/23 1402 09/06/23 2013  Weight: 79.4 kg 77.3  kg   Examination:  General exam: Appears calm and comfortable. Sitting up in chair.  Respiratory system: Clear to auscultation. Respiratory effort normal. Cardiovascular system: normal S1 & S2 heard. No JVD, murmurs, rubs, gallops or clicks. No pedal edema. Gastrointestinal system: Abdomen is nondistended, soft and nontender. No organomegaly or masses felt. Normal bowel sounds  heard. Central nervous system: Alert and oriented. No focal neurological deficits. Extremities: Symmetric 5 x 5 power. Skin: No rashes, lesions or ulcers. Psychiatry: Judgement and insight appear diminished. Mood & affect appropriate.   Data Reviewed: I have personally reviewed following labs and imaging studies  CBC: Recent Labs  Lab 09/08/23 0411 09/09/23 0416 09/13/23 0407  WBC 5.4 5.0 7.2  HGB 10.2* 10.0* 10.5*  HCT 29.9* 30.2* 30.8*  MCV 98.7 98.7 96.9  PLT 193 185 211    Basic Metabolic Panel: Recent Labs  Lab 09/08/23 0411 09/08/23 1000 09/09/23 0416 09/10/23 0405 09/11/23 0449 09/12/23 0404 09/13/23 0407  NA 139  --  137 136 134* 131* 135  K 4.1  --  4.0 4.0 4.2 3.8 3.8  CL 103  --  103 101 100 97* 100  CO2 24  --  23 24 22 22 23   GLUCOSE 92  --  89 91 98 92 103*  BUN 69*  --  71* 72* 75* 76* 76*  CREATININE 8.46*  --  8.50* 8.40* 8.05* 8.05* 8.19*  CALCIUM 8.5*  --  8.4* 8.4* 8.6* 8.6* 8.8*  MG 1.9  --  1.8  --   --   --  1.8  PHOS  --  4.6 4.9*  --   --  5.2* 5.2*    CBG: No results for input(s): "GLUCAP" in the last 168 hours.  Recent Results (from the past 240 hour(s))  Resp panel by RT-PCR (RSV, Flu A&B, Covid) Anterior Nasal Swab     Status: None   Collection Time: 09/06/23  3:21 PM   Specimen: Anterior Nasal Swab  Result Value Ref Range Status   SARS Coronavirus 2 by RT PCR NEGATIVE NEGATIVE Final    Comment: (NOTE) SARS-CoV-2 target nucleic acids are NOT DETECTED.  The SARS-CoV-2 RNA is generally detectable in upper respiratory specimens during the acute phase of infection. The lowest concentration of SARS-CoV-2 viral copies this assay can detect is 138 copies/mL. A negative result does not preclude SARS-Cov-2 infection and should not be used as the sole basis for treatment or other patient management decisions. A negative result may occur with  improper specimen collection/handling, submission of specimen other than nasopharyngeal swab,  presence of viral mutation(s) within the areas targeted by this assay, and inadequate number of viral copies(<138 copies/mL). A negative result must be combined with clinical observations, patient history, and epidemiological information. The expected result is Negative.  Fact Sheet for Patients:  BloggerCourse.com  Fact Sheet for Healthcare Providers:  SeriousBroker.it  This test is no t yet approved or cleared by the [place] FDA and  has been authorized for detection and/or diagnosis of SARS-CoV-2 by FDA under an Emergency Use Authorization (EUA). This EUA will remain  in effect (meaning this test can be used) for the duration of the COVID-19 declaration under Section 564(b)(1) of the Act, 21 U.S.C.section 360bbb-3(b)(1), unless the authorization is terminated  or revoked sooner.       Influenza A by PCR NEGATIVE NEGATIVE Final   Influenza B by PCR NEGATIVE NEGATIVE Final    Comment: (NOTE) The Xpert Xpress SARS-CoV-2/FLU/RSV plus assay is intended  as an aid in the diagnosis of influenza from Nasopharyngeal swab specimens and should not be used as a sole basis for treatment. Nasal washings and aspirates are unacceptable for Xpert Xpress SARS-CoV-2/FLU/RSV testing.  Fact Sheet for Patients: BloggerCourse.com  Fact Sheet for Healthcare Providers: SeriousBroker.it  This test is not yet approved or cleared by the [place] FDA and has been authorized for detection and/or diagnosis of SARS-CoV-2 by FDA under an Emergency Use Authorization (EUA). This EUA will remain in effect (meaning this test can be used) for the duration of the COVID-19 declaration under Section 564(b)(1) of the Act, 21 U.S.C. section 360bbb-3(b)(1), unless the authorization is terminated or revoked.     Resp Syncytial Virus by PCR NEGATIVE NEGATIVE Final    Comment: (NOTE) Fact Sheet for  Patients: BloggerCourse.com  Fact Sheet for Healthcare Providers: SeriousBroker.it  This test is not yet approved or cleared by the [place] FDA and has been authorized for detection and/or diagnosis of SARS-CoV-2 by FDA under an Emergency Use Authorization (EUA). This EUA will remain in effect (meaning this test can be used) for the duration of the COVID-19 declaration under Section 564(b)(1) of the Act, 21 U.S.C. section 360bbb-3(b)(1), unless the authorization is terminated or revoked.  Performed at [facility], [address], [place], [place] [address]   Surgical PCR screen     Status: None   Collection Time: 09/07/23 10:45 PM   Specimen: Nasal Mucosa; Nasal Swab  Result Value Ref Range Status   MRSA, PCR NEGATIVE NEGATIVE Final   Staphylococcus aureus NEGATIVE NEGATIVE Final    Comment: (NOTE) The Xpert SA Assay (FDA approved for NASAL specimens in patients 8 years of age and older), is one component of a comprehensive surveillance program. It is not intended to diagnose infection nor to guide or monitor treatment. Performed at [facility], [address] [place], [place] [address]      Radiology Studies: No results found.  Scheduled Meds:  amLODipine  10 mg Oral Daily   aspirin EC  81 mg Oral Daily   atorvastatin  40 mg Oral Daily   Chlorhexidine Gluconate Cloth  6 each Topical Daily   pantoprazole  40 mg Oral QHS   polyethylene glycol  17 g Oral Daily   senna-docusate  1 tablet Oral BID   tamsulosin  0.4 mg Oral Daily   Continuous Infusions:   LOS: 7 days   Time spent: 53 mins  Briawna Carver Laural Benes, MD How to contact the [facility] Attending or Consulting provider 7A - 7P or covering provider during after hours 7P -7A, for this patient?  Check the care team in [facility] and look for a) attending/consulting TR[facility] provider listed and b) the [facility] team listed Log into [email or web address] to find provider on call.  Locate the [facility]  provider you are looking for under [facility] and page to a number that you can be directly reached. If you still have difficulty reaching the provider, please page the [facility] (Director on Call) for the Hospitalists listed on amion for assistance.  09/13/2023, 4:47 PM

## 2023-09-13 NOTE — Progress Notes (Addendum)
Patient ID: Raymond Burton, male   DOB: 05/15/1941, 82 y.o.   MRN: [id number] S: Had some epigastric discomfort again last night, otherwise no new complaints.  His wife reports improved po intake of food but not water. O:BP 128/68 (BP Location: Right Arm)   Pulse 79   Temp 98 F (36.7 C) (Oral)   Resp 17   Ht 5\' 9"  (1.753 m)   Wt 77.3 kg   SpO2 97%   BMI 25.17 kg/m   Intake/Output Summary (Last 24 hours) at 09/13/2023 1049 Last data filed at 09/13/2023 0921 Gross per 24 hour  Intake 1865.16 ml  Output 1500 ml  Net 365.16 ml   Intake/Output: I/O last 3 completed shifts: In: 1865.2 [P.O.:720; I.V.:1145.2] Out: 4800 [Urine:4800]  Intake/Output this shift:  Total I/O In: 240 [P.O.:240] Out: -  Weight change:  Gen: NAD CVS: RRR Resp:CTA Abd: +BS, soft, NT/ND Ext: no edema  Recent Labs  Lab 09/06/23 1521 09/07/23 0408 09/08/23 0411 09/08/23 1000 09/09/23 0416 09/10/23 0405 09/11/23 0449 09/12/23 0404 09/13/23 0407  NA 141 141 139  --  137 136 134* 131* 135  K 4.1 3.9 4.1  --  4.0 4.0 4.2 3.8 3.8  CL 103 104 103  --  103 101 100 97* 100  CO2 27 24 24   --  23 24 22 22 23   GLUCOSE 107* 88 92  --  89 91 98 92 103*  BUN 73* 72* 69*  --  71* 72* 75* 76* 76*  CREATININE 7.82* 8.02* 8.46*  --  8.50* 8.40* 8.05* 8.05* 8.19*  ALBUMIN 3.6  --   --   --  2.7*  --   --  2.8* 3.0*  CALCIUM 9.3 8.7* 8.5*  --  8.4* 8.4* 8.6* 8.6* 8.8*  PHOS  --   --   --  4.6 4.9*  --   --  5.2* 5.2*  AST 26  --   --   --   --   --   --   --   --   ALT 8  --   --   --   --   --   --   --   --    Liver Function Tests: Recent Labs  Lab 09/06/23 1521 09/09/23 0416 09/12/23 0404 09/13/23 0407  AST 26  --   --   --   ALT 8  --   --   --   ALKPHOS 44  --   --   --   BILITOT 0.9  --   --   --   PROT 7.2  --   --   --   ALBUMIN 3.6 2.7* 2.8* 3.0*   No results for input(s): "LIPASE", "AMYLASE" in the last 168 hours. No results for input(s): "AMMONIA" in the last 168 hours. CBC: Recent Labs   Lab 09/06/23 1521 09/08/23 0411 09/09/23 0416 09/13/23 0407  WBC 6.0 5.4 5.0 7.2  HGB 11.9* 10.2* 10.0* 10.5*  HCT 35.8* 29.9* 30.2* 30.8*  MCV 100.8* 98.7 98.7 96.9  PLT 197 193 185 211   Cardiac Enzymes: No results for input(s): "CKTOTAL", "CKMB", "CKMBINDEX", "TROPONINI" in the last 168 hours. CBG: No results for input(s): "GLUCAP" in the last 168 hours.  Iron Studies: No results for input(s): "IRON", "TIBC", "TRANSFERRIN", "FERRITIN" in the last 72 hours. Studies/Results: No results found.  amLODipine  10 mg Oral Daily   aspirin EC  81 mg Oral Daily   atorvastatin  40  mg Oral Daily   Chlorhexidine Gluconate Cloth  6 each Topical Daily   polyethylene glycol  17 g Oral Daily   senna-docusate  1 tablet Oral BID   tamsulosin  0.4 mg Oral Daily    BMET    Component Value Date/Time   NA 135 09/13/2023 0407   K 3.8 09/13/2023 0407   CL 100 09/13/2023 0407   CO2 23 09/13/2023 0407   GLUCOSE 103 (H) 09/13/2023 0407   BUN 76 (H) 09/13/2023 0407   CREATININE 8.19 (H) 09/13/2023 0407   CALCIUM 8.8 (L) 09/13/2023 0407   GFRNONAA 6 (L) 09/13/2023 0407   CBC    Component Value Date/Time   WBC 7.2 09/13/2023 0407   RBC 3.18 (L) 09/13/2023 0407   HGB 10.5 (L) 09/13/2023 0407   HCT 30.8 (L) 09/13/2023 0407   PLT 211 09/13/2023 0407   MCV 96.9 09/13/2023 0407   MCH 33.0 09/13/2023 0407   MCHC 34.1 09/13/2023 0407   RDW 12.4 09/13/2023 0407   Assessment/Plan:   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  Presented with poor po intake in setting of concomitant ARB, diuretics, and possible bladder outlet obstruction.  Felt to be ATN.  Scr peaked at 8.5 on 09/09/23 but has had excellent UOP following foley catheter placement, however has ranged 8.05-8.19 for the past 3 days.  UOP of 2800 mL overnight but po intake of only 720.  Restarted on IVF's yesterday  without significant improvement.  No indication for dialysis at this time.  Continue to follow closely.  Continue to hold ARB and hydrochlorothiazide.  Also on the differential would be a paraproteinemia given his elevated lambda free chains.  SPEP is still pending.  Will decrease IVF's to 50 mL/hr for now and follow. Baseline functional status is good.  His niece is a nephrologist at [employer].  She was very supportive of any decisions that her aunt and uncle may make regarding dialysis in the future (and if they do decide to pursue dialysis she felt that they would be a great candidate for urgent start PD rather than hemo (available at [facility] vs here if can be arranged).)  Strict ins/outs BOO - per charting. s/p foley catheter placement with excellent UOP.  On flomax (here and at home) Chest pain - elevated troponin.  ECHO with severe LVH and preserved EF and RV function.  Per primary team.  I see an order for cardiology consult but not a note.   Proteinuria - subnephrotic.  ANA negative, complements WNL, high lambda light chains, but SPEP pending.  ANCA negative. HIV and hepatitis negative.   Abnormal free light chains ratio - would recommend hem/onc evaluation. Await SPEP Anemia - normocytic.  Possibly related to CKD.  TSAT 53%.  No indication for ESA at this time, continue to follow. CBC in AM  HTN - stable off of ARB-hydrochlorothiazide.  Parkinson's disease - cont with sinemet per primary team.  Per the patient's wife, some days he will take his time but he will most often come up with the correct answer to questions    Disposition - continue inpatient monitoring.  Awaiting family meeting for goals of care and decision regarding PD.  Irena Cords, MD [employer] [phone]

## 2023-09-14 ENCOUNTER — Inpatient Hospital Stay (HOSPITAL_COMMUNITY): Payer: Medicare PPO

## 2023-09-14 DIAGNOSIS — N179 Acute kidney failure, unspecified: Secondary | ICD-10-CM | POA: Diagnosis not present

## 2023-09-14 LAB — PROTEIN / CREATININE RATIO, URINE
Creatinine, Urine: 94 mg/dL
Protein Creatinine Ratio: 2.43 mg/mg{creat} — ABNORMAL HIGH (ref 0.00–0.15)
Total Protein, Urine: 228 mg/dL

## 2023-09-14 LAB — RENAL FUNCTION PANEL
Albumin: 2.8 g/dL — ABNORMAL LOW (ref 3.5–5.0)
Anion gap: 11 (ref 5–15)
BUN: 82 mg/dL — ABNORMAL HIGH (ref 8–23)
CO2: 22 mmol/L (ref 22–32)
Calcium: 8.7 mg/dL — ABNORMAL LOW (ref 8.9–10.3)
Chloride: 102 mmol/L (ref 98–111)
Creatinine, Ser: 7.92 mg/dL — ABNORMAL HIGH (ref 0.61–1.24)
GFR, Estimated: 6 mL/min — ABNORMAL LOW (ref >60)
Glucose, Bld: 96 mg/dL (ref 70–99)
Phosphorus: 4.9 mg/dL — ABNORMAL HIGH (ref 2.5–4.6)
Potassium: 4 mmol/L (ref 3.5–5.1)
Sodium: 135 mmol/L (ref 135–145)

## 2023-09-14 LAB — NA AND K (SODIUM & POTASSIUM), RAND UR
Potassium Urine: 39 mmol/L
Sodium, Ur: 24 mmol/L

## 2023-09-14 MED ORDER — TRAZODONE HCL 50 MG PO TABS
25.0000 mg | ORAL_TABLET | Freq: Every evening | ORAL | Status: DC | PRN
  Administered 2023-09-18 – 2023-09-19 (×2): 25 mg via ORAL
  Filled 2023-09-14 (×2): qty 1

## 2023-09-14 MED ORDER — LORAZEPAM 0.5 MG PO TABS
0.5000 mg | ORAL_TABLET | ORAL | Status: DC | PRN
  Administered 2023-09-18 – 2023-09-19 (×4): 0.5 mg via ORAL
  Filled 2023-09-14 (×4): qty 1

## 2023-09-14 MED ORDER — MORPHINE SULFATE (PF) 2 MG/ML IV SOLN
2.0000 mg | Freq: Once | INTRAVENOUS | Status: AC
  Administered 2023-09-14: 2 mg via INTRAVENOUS
  Filled 2023-09-14: qty 1

## 2023-09-14 NOTE — Progress Notes (Signed)
Mobility Specialist Progress Note:    09/14/23 0945  Mobility  Activity Ambulated with assistance in hallway  Level of Assistance Standby assist, set-up cues, supervision of patient - no hands on  Assistive Device Front wheel walker  Distance Ambulated (ft) 200 ft  Range of Motion/Exercises Active;All extremities  Activity Response Tolerated well  Mobility Referral Yes  Mobility visit 1 Mobility  Mobility Specialist Start Time (ACUTE ONLY) 0920  Mobility Specialist Stop Time (ACUTE ONLY) 0940  Mobility Specialist Time Calculation (min) (ACUTE ONLY) 20 min   Pt received in in chair, wife in room. Agreeable to mobility, required SBA to stand and ambulate with RW. Tolerated well, asx throughout. Returned to room, left in chair. Alarm on and call bell in reach, all needs met.   Lawerance Bach Mobility Specialist Please contact via Special educational needs teacher or  Rehab office at [phone]

## 2023-09-14 NOTE — Plan of Care (Signed)
  Problem: Health Behavior/Discharge Planning: Goal: Ability to manage health-related needs will improve Outcome: Progressing   Problem: Clinical Measurements: Goal: Ability to maintain clinical measurements within normal limits will improve Outcome: Progressing   Problem: Nutrition: Goal: Adequate nutrition will be maintained Outcome: Progressing   

## 2023-09-14 NOTE — Progress Notes (Addendum)
PROGRESS NOTE   Raymond Burton  WUJ:[id number] DOB: Sep 08, 1941 DOA: 09/06/2023 PCP: Renaye Rakers, MD   Chief Complaint  Patient presents with   Fatigue   Level of care: Telemetry  Brief Admission History:  82 y.o. male with medical history significant for bradycardia, hypertension, prediabetes, Parkinson's, TIA.  Patient was brought to the ED by family with complaints of generalized weakness. 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  Family reports gradual weakness over the past several weeks to months, over the past 3 days it has been more pronounced, with patient lying in bed.  He also reports gradual onset of poor appetite in the same time period, which is different from his baseline.  He has been admitted for AKI versus progression of CKD as well as elevated troponin levels.  Nephrology has ordered further serologies and has continued IV fluid for now.  Appears to be slowly improving.  Continue to be followed by nephrology and restarted on IV fluid.   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  -UA with small hemoglobin, 30 protein -Renal ultrasound with no signs of hydronephrosis -Inserted Foley for BOO, now urine freely flowing -Appreciate ongoing nephrology evaluation -Held hydrochlorothiazide/telmisartan (25/80) -Appears that creatinine was 1.3 per PCP on 05/2023 -nephrology following closely for renal recovery -proteinuria being worked up by nephrology, with abnormal free light chains ratio will need hem/onc evaluation outpatient   Elevated troponin Troponin 458 > 475    - Currently chest pain-free.  EKG without ST or T wave changes. - EDP talked to cardiology, does not feel that this is very concerning with normal EKG in the setting of significant renal dysfunction, should be okay for patient to stay at  [facility].  -Echocardiogram with no wall motion abnormalities and preserved LVEF -IV heparin discontinued 11/29   Parkinson disease Follows with neurology.  Baseline dementia, at baseline he can answer simple questions, but on bad days he is barely able to communicate. -Resumed home dose Sinemet   GERD - pantoprazole ordered for GI protection  Pre-diabetes - Hgba1c 6.0% - diet controlled   Bradycardia Sinus bradycardia by EKG.  Heart rate 49-63.  History of bradycardia.  Not on rate limiting medications. -TSH normal at 2.05   Essential hypertension -controlled  -Currently on amlodipine   Worsening anemia of CKD No indication for ESA Iron panel ordered per nephrology   Constipation-resolved Started MiraLAX and Senokot    DVT prophylaxis: SCDs Code Status: Full Family Communication: Wife present at bedside 12/4, 12/5, 12/6 Disposition Plan: Await nephrology decision on hemodialysis vs outpatient surveillance  Consultants:  Nephrology  Procedures:   Antimicrobials:    Subjective: Pt continues putting out urine.   Objective: Vitals:   09/13/23 2200 09/14/23 0525 09/14/23 0858 09/14/23 1458  BP: 133/82 114/60 114/63 (!) 95/53  Pulse: 85 90  80  Resp: 20 20  17   Temp:    98 F (36.7 C)  TempSrc:  Oral  Oral  SpO2: 99% 99%  96%  Weight:      Height:        Intake/Output Summary (Last 24 hours) at 09/14/2023 1716 Last data filed at 09/14/2023 0920 Gross per 24 hour  Intake 600 ml  Output 2000 ml  Net -1400 ml   Filed Weights   09/06/23 1402 09/06/23 2013  Weight: 79.4 kg  77.3 kg   Examination:  General exam: Appears calm and comfortable. Sitting up in chair.  Respiratory system: Clear to auscultation. Respiratory effort normal. Cardiovascular system: normal S1 & S2 heard. No JVD, murmurs, rubs, gallops or clicks. No pedal edema. Gastrointestinal system: Abdomen is nondistended, soft and nontender. No organomegaly or masses felt. Normal bowel sounds  heard. Central nervous system: Alert and oriented. No focal neurological deficits. Extremities: Symmetric 5 x 5 power. Skin: No rashes, lesions or ulcers. Psychiatry: Judgement and insight appear diminished. Mood & affect appropriate.   Data Reviewed: I have personally reviewed following labs and imaging studies  CBC: Recent Labs  Lab 09/08/23 0411 09/09/23 0416 09/13/23 0407  WBC 5.4 5.0 7.2  HGB 10.2* 10.0* 10.5*  HCT 29.9* 30.2* 30.8*  MCV 98.7 98.7 96.9  PLT 193 185 211    Basic Metabolic Panel: Recent Labs  Lab 09/08/23 0411 09/08/23 1000 09/09/23 0416 09/10/23 0405 09/11/23 0449 09/12/23 0404 09/13/23 0407 09/14/23 0344  NA 139  --  137 136 134* 131* 135 135  K 4.1  --  4.0 4.0 4.2 3.8 3.8 4.0  CL 103  --  103 101 100 97* 100 102  CO2 24  --  23 24 22 22 23 22   GLUCOSE 92  --  89 91 98 92 103* 96  BUN 69*  --  71* 72* 75* 76* 76* 82*  CREATININE 8.46*  --  8.50* 8.40* 8.05* 8.05* 8.19* 7.92*  CALCIUM 8.5*  --  8.4* 8.4* 8.6* 8.6* 8.8* 8.7*  MG 1.9  --  1.8  --   --   --  1.8  --   PHOS  --  4.6 4.9*  --   --  5.2* 5.2* 4.9*    CBG: No results for input(s): "GLUCAP" in the last 168 hours.  Recent Results (from the past 240 hour(s))  Resp panel by RT-PCR (RSV, Flu A&B, Covid) Anterior Nasal Swab     Status: None   Collection Time: 09/06/23  3:21 PM   Specimen: Anterior Nasal Swab  Result Value Ref Range Status   SARS Coronavirus 2 by RT PCR NEGATIVE NEGATIVE Final    Comment: (NOTE) SARS-CoV-2 target nucleic acids are NOT DETECTED.  The SARS-CoV-2 RNA is generally detectable in upper respiratory specimens during the acute phase of infection. The lowest concentration of SARS-CoV-2 viral copies this assay can detect is 138 copies/mL. A negative result does not preclude SARS-Cov-2 infection and should not be used as the sole basis for treatment or other patient management decisions. A negative result may occur with  improper specimen collection/handling,  submission of specimen other than nasopharyngeal swab, presence of viral mutation(s) within the areas targeted by this assay, and inadequate number of viral copies(<138 copies/mL). A negative result must be combined with clinical observations, patient history, and epidemiological information. The expected result is Negative.  Fact Sheet for Patients:  BloggerCourse.com  Fact Sheet for Healthcare Providers:  SeriousBroker.it  This test is no t yet approved or cleared by the [place] FDA and  has been authorized for detection and/or diagnosis of SARS-CoV-2 by FDA under an Emergency Use Authorization (EUA). This EUA will remain  in effect (meaning this test can be used) for the duration of the COVID-19 declaration under Section 564(b)(1) of the Act, 21 U.S.C.section 360bbb-3(b)(1), unless the authorization is terminated  or revoked sooner.       Influenza A by PCR NEGATIVE NEGATIVE Final   Influenza B by PCR NEGATIVE  NEGATIVE Final    Comment: (NOTE) The Xpert Xpress SARS-CoV-2/FLU/RSV plus assay is intended as an aid in the diagnosis of influenza from Nasopharyngeal swab specimens and should not be used as a sole basis for treatment. Nasal washings and aspirates are unacceptable for Xpert Xpress SARS-CoV-2/FLU/RSV testing.  Fact Sheet for Patients: BloggerCourse.com  Fact Sheet for Healthcare Providers: SeriousBroker.it  This test is not yet approved or cleared by the [place] FDA and has been authorized for detection and/or diagnosis of SARS-CoV-2 by FDA under an Emergency Use Authorization (EUA). This EUA will remain in effect (meaning this test can be used) for the duration of the COVID-19 declaration under Section 564(b)(1) of the Act, 21 U.S.C. section 360bbb-3(b)(1), unless the authorization is terminated or revoked.     Resp Syncytial Virus by PCR NEGATIVE  NEGATIVE Final    Comment: (NOTE) Fact Sheet for Patients: BloggerCourse.com  Fact Sheet for Healthcare Providers: SeriousBroker.it  This test is not yet approved or cleared by the [place] FDA and has been authorized for detection and/or diagnosis of SARS-CoV-2 by FDA under an Emergency Use Authorization (EUA). This EUA will remain in effect (meaning this test can be used) for the duration of the COVID-19 declaration under Section 564(b)(1) of the Act, 21 U.S.C. section 360bbb-3(b)(1), unless the authorization is terminated or revoked.  Performed at [facility], [address] [place], [place] [address]   Surgical PCR screen     Status: None   Collection Time: 09/07/23 10:45 PM   Specimen: Nasal Mucosa; Nasal Swab  Result Value Ref Range Status   MRSA, PCR NEGATIVE NEGATIVE Final   Staphylococcus aureus NEGATIVE NEGATIVE Final    Comment: (NOTE) The Xpert SA Assay (FDA approved for NASAL specimens in patients 44 years of age and older), is one component of a comprehensive surveillance program. It is not intended to diagnose infection nor to guide or monitor treatment. Performed at [facility], [address], [place], [place] [address]      Radiology Studies: DG Bone Survey Met  Result Date: 09/14/2023 CLINICAL DATA:  Multiple myeloma. EXAM: METASTATIC BONE SURVEY COMPARISON:  Chest radiographs 09/06/2023 and abdominopelvic CT 07/03/2017. FINDINGS: Lateral view of the skull demonstrates no lytic lesions. No lytic lesions or pathologic fractures are identified within the axial skeleton. Mild diffuse spondylosis with a grade 1 degenerative anterolisthesis at L4-5. The heart size and mediastinal contours are stable. Mild atelectasis at both lung bases. No lytic lesions, pathologic fractures or significant arthropathic changes are identified within the visualized appendicular skeleton. IMPRESSION: 1. No radiographic evidence of  multiple myeloma within the axial or visualized appendicular skeleton. 2. Mild multilevel spondylosis.  No acute osseous findings. Electronically Signed   By: Carey Bullocks M.D.   On: 09/14/2023 15:15    Scheduled Meds:  amLODipine  10 mg Oral Daily   aspirin EC  81 mg Oral Daily   atorvastatin  40 mg Oral Daily   Chlorhexidine Gluconate Cloth  6 each Topical Daily   pantoprazole  40 mg Oral QHS   polyethylene glycol  17 g Oral Daily   senna-docusate  1 tablet Oral BID   tamsulosin  0.4 mg Oral Daily   Continuous Infusions:   LOS: 8 days   Time spent: 51 mins  Kessie Croston Laural Benes, MD How to contact the [facility] Attending or Consulting provider 7A - 7P or covering provider during after hours 7P -7A, for this patient?  Check the care team in [facility] and look for a) attending/consulting  TRH provider listed and b) the [facility] team listed Log into [email or web address] to find provider on call.  Locate the [facility] provider you are looking for under [facility] and page to a number that you can be directly reached. If you still have difficulty reaching the provider, please page the [facility] (Director on Call) for the Hospitalists listed on amion for assistance.  09/14/2023, 5:16 PM

## 2023-09-14 NOTE — Progress Notes (Signed)
Patient ID: EDGEL VI, male   DOB: 1941/06/13, 82 y.o.   MRN: [id number] S: Pt feeling well this am.  Scr has improved after IVF's which were stopped last night. O:BP 114/63   Pulse 90   Temp 98.6 F (37 C) (Oral)   Resp 20   Ht 5\' 9"  (1.753 m)   Wt 77.3 kg   SpO2 99%   BMI 25.17 kg/m   Intake/Output Summary (Last 24 hours) at 09/14/2023 1017 Last data filed at 09/14/2023 0920 Gross per 24 hour  Intake 1429.17 ml  Output 2000 ml  Net -570.83 ml   Intake/Output: I/O last 3 completed shifts: In: 2564.3 [P.O.:1080; I.V.:1484.3] Out: 3500 [Urine:3500]  Intake/Output this shift:  Total I/O In: 240 [P.O.:240] Out: -  Weight change:  Gen: NAD CVS: RRR Resp:CTA  Abd:+BS, soft, NT/ND Ext: trace pretibial edema bilaterally  Recent Labs  Lab 09/08/23 0411 09/08/23 1000 09/09/23 0416 09/10/23 0405 09/11/23 0449 09/12/23 0404 09/13/23 0407 09/14/23 0344  NA 139  --  137 136 134* 131* 135 135  K 4.1  --  4.0 4.0 4.2 3.8 3.8 4.0  CL 103  --  103 101 100 97* 100 102  CO2 24  --  23 24 22 22 23 22   GLUCOSE 92  --  89 91 98 92 103* 96  BUN 69*  --  71* 72* 75* 76* 76* 82*  CREATININE 8.46*  --  8.50* 8.40* 8.05* 8.05* 8.19* 7.92*  ALBUMIN  --   --  2.7*  --   --  2.8* 3.0* 2.8*  CALCIUM 8.5*  --  8.4* 8.4* 8.6* 8.6* 8.8* 8.7*  PHOS  --  4.6 4.9*  --   --  5.2* 5.2* 4.9*   Liver Function Tests: Recent Labs  Lab 09/12/23 0404 09/13/23 0407 09/14/23 0344  ALBUMIN 2.8* 3.0* 2.8*   No results for input(s): "LIPASE", "AMYLASE" in the last 168 hours. No results for input(s): "AMMONIA" in the last 168 hours. CBC: Recent Labs  Lab 09/08/23 0411 09/09/23 0416 09/13/23 0407  WBC 5.4 5.0 7.2  HGB 10.2* 10.0* 10.5*  HCT 29.9* 30.2* 30.8*  MCV 98.7 98.7 96.9  PLT 193 185 211   Cardiac Enzymes: No results for input(s): "CKTOTAL", "CKMB", "CKMBINDEX", "TROPONINI" in the last 168 hours. CBG: No results for input(s): "GLUCAP" in the last 168 hours.  Iron Studies: No  results for input(s): "IRON", "TIBC", "TRANSFERRIN", "FERRITIN" in the last 72 hours. Studies/Results: No results found.  amLODipine  10 mg Oral Daily   aspirin EC  81 mg Oral Daily   atorvastatin  40 mg Oral Daily   Chlorhexidine Gluconate Cloth  6 each Topical Daily   pantoprazole  40 mg Oral QHS   polyethylene glycol  17 g Oral Daily   senna-docusate  1 tablet Oral BID   tamsulosin  0.4 mg Oral Daily    BMET    Component Value Date/Time   NA 135 09/14/2023 0344   K 4.0 09/14/2023 0344   CL 102 09/14/2023 0344   CO2 22 09/14/2023 0344   GLUCOSE 96 09/14/2023 0344   BUN 82 (H) 09/14/2023 0344   CREATININE 7.92 (H) 09/14/2023 0344   CALCIUM 8.7 (L) 09/14/2023 0344   GFRNONAA 6 (L) 09/14/2023 0344   CBC    Component Value Date/Time   WBC 7.2 09/13/2023 0407   RBC 3.18 (L) 09/13/2023 0407   HGB 10.5 (L) 09/13/2023 0407   HCT 30.8 (L) 09/13/2023 0407  PLT 211 09/13/2023 0407   MCV 96.9 09/13/2023 0407   MCH 33.0 09/13/2023 0407   MCHC 34.1 09/13/2023 0407   RDW 12.4 09/13/2023 0407    Assessment/Plan:   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  Presented with poor po intake in setting of concomitant ARB, diuretics, and possible bladder outlet obstruction.  Felt to be ATN.  Scr peaked at 8.5 on 09/09/23 but has had excellent UOP following foley catheter placement, however has ranged 8.05-8.19 for the past 3 days.  Thankfully started to improve to 7.92 this morning.  UOP of 3500 mL overnight but po intake of only 1080.  Restarted on IVF's 2 days ago and stopped last night.  No indication for dialysis at this time.  Continue to follow closely.  Continue to hold ARB and hydrochlorothiazide.  Also on the differential would be a paraproteinemia given his elevated lambda free chains.  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  He may  benefit from Hematology consult.  Will also start bone survey for lytic lesions. Baseline functional status is good.  His niece is a nephrologist at [employer]  She was very supportive of any decisions that her aunt and uncle may make regarding dialysis in the future (and if they do decide to pursue dialysis she felt that they would be a great candidate for urgent start PD rather than hemo (available at [facility] vs here if can be arranged).)  Strict ins/outs BOO - per charting. s/p foley catheter placement with excellent UOP.  On flomax (here and at home) Chest pain - elevated troponin.  ECHO with severe LVH and preserved EF and RV function.  Per primary team.  I see an order for cardiology consult but not a note.   Proteinuria - subnephrotic.  ANA negative, complements WNL, ANCA negative. HIV and hepatitis negative.  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  Will order immunofixation and may benefit from hematology consult.   Abnormal free light chains ratio - would recommend hem/onc evaluation. Await SPEP Anemia - normocytic.  Possibly related to CKD.  TSAT 53%.  No indication for ESA at this time, continue to follow. CBC in AM  HTN - stable off of ARB-hydrochlorothiazide.  Parkinson's disease - cont with sinemet per primary team.  Per the patient's wife, some days he will take his time but he will most often come up with the correct answer to questions    Disposition - continue inpatient monitoring.  Awaiting family meeting for goals of care and decision regarding PD.  Irena Cords, MD [employer] [phone]

## 2023-09-15 ENCOUNTER — Inpatient Hospital Stay (HOSPITAL_COMMUNITY): Payer: Medicare PPO

## 2023-09-15 DIAGNOSIS — I959 Hypotension, unspecified: Secondary | ICD-10-CM

## 2023-09-15 DIAGNOSIS — R7989 Other specified abnormal findings of blood chemistry: Secondary | ICD-10-CM | POA: Diagnosis not present

## 2023-09-15 DIAGNOSIS — R7303 Prediabetes: Secondary | ICD-10-CM | POA: Diagnosis not present

## 2023-09-15 DIAGNOSIS — N179 Acute kidney failure, unspecified: Secondary | ICD-10-CM | POA: Diagnosis not present

## 2023-09-15 DIAGNOSIS — I480 Paroxysmal atrial fibrillation: Secondary | ICD-10-CM

## 2023-09-15 LAB — RENAL FUNCTION PANEL
Albumin: 2.8 g/dL — ABNORMAL LOW (ref 3.5–5.0)
Anion gap: 13 (ref 5–15)
BUN: 80 mg/dL — ABNORMAL HIGH (ref 8–23)
CO2: 22 mmol/L (ref 22–32)
Calcium: 8.8 mg/dL — ABNORMAL LOW (ref 8.9–10.3)
Chloride: 101 mmol/L (ref 98–111)
Creatinine, Ser: 8.26 mg/dL — ABNORMAL HIGH (ref 0.61–1.24)
GFR, Estimated: 6 mL/min — ABNORMAL LOW (ref >60)
Glucose, Bld: 117 mg/dL — ABNORMAL HIGH (ref 70–99)
Phosphorus: 4.4 mg/dL (ref 2.5–4.6)
Potassium: 4.2 mmol/L (ref 3.5–5.1)
Sodium: 136 mmol/L (ref 135–145)

## 2023-09-15 LAB — URINALYSIS, ROUTINE W REFLEX MICROSCOPIC
Bilirubin Urine: NEGATIVE
Glucose, UA: NEGATIVE mg/dL
Ketones, ur: NEGATIVE mg/dL
Nitrite: NEGATIVE
Protein, ur: 100 mg/dL — AB
RBC / HPF: >50 RBC/hpf (ref 0–5)
Specific Gravity, Urine: 1.014 (ref 1.005–1.030)
WBC, UA: >50 WBC/hpf (ref 0–5)
pH: 5 (ref 5.0–8.0)

## 2023-09-15 LAB — MAGNESIUM: Magnesium: 1.7 mg/dL (ref 1.7–2.4)

## 2023-09-15 LAB — CBC
HCT: 26.1 % — ABNORMAL LOW (ref 39.0–52.0)
Hemoglobin: 9 g/dL — ABNORMAL LOW (ref 13.0–17.0)
MCH: 33.6 pg (ref 26.0–34.0)
MCHC: 34.5 g/dL (ref 30.0–36.0)
MCV: 97.4 fL (ref 80.0–100.0)
Platelets: 206 10*3/uL (ref 150–400)
RBC: 2.68 MIL/uL — ABNORMAL LOW (ref 4.22–5.81)
RDW: 12.8 % (ref 11.5–15.5)
WBC: 15.6 10*3/uL — ABNORMAL HIGH (ref 4.0–10.5)
nRBC: 0 % (ref 0.0–0.2)

## 2023-09-15 LAB — PROCALCITONIN: Procalcitonin: 4.51 ng/mL

## 2023-09-15 LAB — POTASSIUM: Potassium: 4.1 mmol/L (ref 3.5–5.1)

## 2023-09-15 MED ORDER — HEPARIN (PORCINE) 25000 UT/250ML-% IV SOLN
1250.0000 [IU]/h | INTRAVENOUS | Status: AC
  Administered 2023-09-15 – 2023-09-16 (×2): 1100 [IU]/h via INTRAVENOUS
  Filled 2023-09-15 (×3): qty 250

## 2023-09-15 MED ORDER — DIGOXIN 0.25 MG/ML IJ SOLN
0.2500 mg | Freq: Once | INTRAMUSCULAR | Status: AC
  Administered 2023-09-15: 0.25 mg via INTRAVENOUS
  Filled 2023-09-15: qty 1

## 2023-09-15 MED ORDER — AMIODARONE HCL 200 MG PO TABS
200.0000 mg | ORAL_TABLET | Freq: Two times a day (BID) | ORAL | Status: DC
  Administered 2023-09-15 – 2023-09-19 (×10): 200 mg via ORAL
  Filled 2023-09-15 (×10): qty 1

## 2023-09-15 MED ORDER — HEPARIN BOLUS VIA INFUSION
4000.0000 [IU] | Freq: Once | INTRAVENOUS | Status: AC
Start: 2023-09-15 — End: 2023-09-15
  Administered 2023-09-15: 4000 [IU] via INTRAVENOUS
  Filled 2023-09-15: qty 4000

## 2023-09-15 NOTE — Consult Note (Addendum)
Cardiology Consultation   Patient ID: Raymond Burton MRN: [id number]; DOB: [birth year]  Admit date: 09/06/2023 Date of Consult: 09/15/2023  PCP:  Renaye Rakers, MD   [facility] Providers Cardiologist: Previously Dr. Rennis Golden in 2018 with no outpatient follow-up since   Patient Profile:   Raymond Burton is a 82 y.o. male with a hx of HTN, HLD, Parkinson's Disease, prior TIA and bradycardia who is being seen 09/15/2023 for the evaluation of atrial fibrillation at the request of Dr. Arville Care.  History of Present Illness:   [name] presented to [facility] ED on 09/06/2023 for evaluation of fatigue and weakness. Was found to have an AKI with creatinine elevated at 7.82 and was felt to be due to poor oral intake and also being on Telmisartan-HCTZ prior to admission. Hs troponin values were checked on admission and found to be elevated at 458, 475 and 424. This was reviewed with cardiology on-call and felt to be secondary to demand ischemia in the setting of his significant renal dysfunction. EKG showed sinus bradycardia, HR 53 with LVH and no acute ST changes. He was started on IV Heparin and an echocardiogram was obtained which showed a preserved EF of 60 to 65% with no regional wall motion normalities. He did have normal RV function, normal PASP, severely dilated left atrium, trivial MR and borderline dilatation of the ascending aorta at 38 mm.  He has been followed closely by Nephrology and was initially receiving IV fluids. These were discontinued on 09/13/2023. Creatinine remains significantly elevated and is 8.26 today. No indications for HD at this time by review of the most recent notes. SPEP was positive for an M spike and was recommended to consider hematology consult. Underwent a bone survey yesterday which showed no radiographic evidence of multiple myeloma.  During the early morning hours of 09/15/2023, he was noted to be tachycardic with heart rate in the 120's. EKG was obtained  which showed newly documented atrial fibrillation with RVR. It appears he received a dose of IV Digoxin 0.25 mg. There are no notes in the chart by the overnight provider in regards to why this was chosen but assume due to soft BP (SBP has been in the 90's to low-100's but he has been receiving Amlodipine 10mg  daily).  By review of telemetry, he was in atrial fibrillation with heart rate in the 100's and he converted back to normal sinus rhythm around 0500 with heart rate in the 60's to 70's. Had recurrent atrial fibrillation around 0800 but now back in normal sinus rhythm again.  In talking with the patient, his wife and his son at the bedside, they report that he was ambulating last night when the episode occurred and initially felt the rhythm might have been secondary to activity.  He was overall asymptomatic at that time and they were unaware that he had recurrence this morning. He denies any specific chest pain or palpitations. Generally feels weak due to being in the hospital.  Unaware of any prior history of atrial fibrillation.  His son does have atrial fibrillation and by their description has required DCCV in the past.  Past Medical History:  Diagnosis Date   Bradycardia    Diabetes mellitus without complication (HCC)    Hyperlipidemia    Hypertension    NUCLEAR STRESS TEST, 07/17/2007 - EKG negative ischemia   Memory loss    Murmur    2D ECHO, 02/04/2009 - EF 55-65%, normal   TIA (transient ischemic attack)  History reviewed. No pertinent surgical history.   Home Medications:  Prior to Admission medications   Medication Sig Start Date End Date Taking? Authorizing Provider  amLODipine (NORVASC) 10 MG tablet Take 10 mg by mouth daily. 03/25/13  Yes [provider]  carbidopa-levodopa (SINEMET IR) 25-100 MG tablet Take 1 tablet by mouth 3 (three) times daily. 12/15/21 04/20/24 Yes [provider]  GARLIC PO Take 1 tablet by mouth daily. 11/03/21  Yes [provider]  Multiple Vitamin (MULTIVITAMIN) tablet Take 1 tablet by mouth daily.   Yes [provider]  NAMZARIC 28-10 MG CP24 Take 1 capsule by mouth at bedtime. 11/03/21  Yes [provider]  pravastatin (PRAVACHOL) 40 MG tablet TAKE 1 TABLET BY MOUTH ONCE DAILY. 09/05/17  Yes Hilty, Lisette Abu, MD  sertraline (ZOLOFT) 25 MG tablet Take 1 tablet by mouth daily. 08/22/23 08/21/24 Yes [provider]  tamsulosin (FLOMAX) 0.4 MG CAPS capsule Take 0.4 mg by mouth daily.   Yes [provider]  telmisartan-hydrochlorothiazide (MICARDIS HCT) 80-25 MG per tablet Take 1 tablet by mouth daily. 01/28/13  Yes [provider]    Inpatient Medications: Scheduled Meds:  amLODipine  10 mg Oral Daily   aspirin EC  81 mg Oral Daily   atorvastatin  40 mg Oral Daily   Chlorhexidine Gluconate Cloth  6 each Topical Daily   pantoprazole  40 mg Oral QHS   polyethylene glycol  17 g Oral Daily   senna-docusate  1 tablet Oral BID   tamsulosin  0.4 mg Oral Daily    PRN Meds: acetaminophen **OR** acetaminophen, alum & mag hydroxide-simeth, LORazepam, naphazoline-glycerin, ondansetron **OR** ondansetron (ZOFRAN) IV, traZODone  Allergies:   No Known Allergies  Social History:   Social History   Socioeconomic History   Marital status: Married    Spouse name: Not on file   Number of children: Not on file   Years of education: Not on file   Highest education level: Not on file  Occupational History   Not on file  Tobacco Use   Smoking status: Never   Smokeless tobacco: Never   Tobacco comments:    "smoked for about 2 weeks, gave him a headache"  Substance and Sexual Activity   Alcohol use: No   Drug use: No   Sexual activity: Not on file  Other Topics Concern   Not on file  Social History Narrative   Not on file    Family History:    Family History  Problem Relation Age of Onset   Heart attack Mother    Stroke Mother      ROS:  Please see the  history of present illness. All other ROS reviewed and negative.     Physical Exam/Data:   Vitals:   09/15/23 0256 09/15/23 0600 09/15/23 0910 09/15/23 0945  BP: 99/61 110/65 (!) 93/50 (!) 93/50  Pulse:  99 84   Resp: 16 16 16    Temp: (!) 97.5 F (36.4 C) 97.6 F (36.4 C) 99.4 F (37.4 C)   TempSrc: Axillary Oral Oral   SpO2: 100% 96% 98%   Weight:      Height:        Intake/Output Summary (Last 24 hours) at 09/15/2023 1026 Last data filed at 09/15/2023 1005 Gross per 24 hour  Intake 1080 ml  Output 950 ml  Net 130 ml      09/06/2023    8:13 PM 09/06/2023    2:02 PM 07/03/2017  9:35 AM  Last 3 Weights  Weight (lbs) 170 lb 6.7 oz 175 lb 181 lb 6.4 oz  Weight (kg) 77.3 kg 79.379 kg 82.283 kg     Body mass index is 25.17 kg/m.  General: Elderly male appearing in no acute distress HEENT: normal Neck: no JVD Vascular: No carotid bruits; Distal pulses 2+ bilaterally Cardiac:  normal S1, S2; RRR; no murmur  Lungs:  clear to auscultation bilaterally, no wheezing, rhonchi or rales  Abd: soft, nontender, no hepatomegaly  Ext: 1+ pitting edema bilaterally Musculoskeletal:  No deformities, BUE and BLE strength normal and equal Skin: warm and dry  Neuro:  CNs 2-12 intact, no focal abnormalities noted Psych:  Normal affect   EKG:  The EKG was personally reviewed and demonstrates: sinus bradycardia, HR 53 with LVH and no acute ST changes  Relevant CV Studies:  Echocardiogram: 08/2023 IMPRESSIONS    1. Left ventricular ejection fraction, by estimation, is 60 to 65%. The  left ventricle has normal function. The left ventricle has no regional  wall motion abnormalities. There is severe asymmetric left ventricular  hypertrophy of the septal segment. Left   ventricular diastolic parameters were normal.   2. Right ventricular systolic function is normal. The right ventricular  size is normal. There is normal pulmonary artery systolic pressure.   3. Left atrial size was  severely dilated.   4. The mitral valve is normal in structure. Trivial mitral valve  regurgitation. No evidence of mitral stenosis.   5. The aortic valve is tricuspid. Aortic valve regurgitation is trivial.  No aortic stenosis is present.   6. Aortic dilatation noted. There is borderline dilatation of the  ascending aorta, measuring 38 mm.   7. The inferior vena cava is normal in size with greater than 50%  respiratory variability, suggesting right atrial pressure of 3 mmHg.   Comparison(s): No prior Echocardiogram.   Laboratory Data:  High Sensitivity Troponin:   Recent Labs  Lab 09/06/23 1521 09/06/23 1757 09/07/23 0408  TROPONINIHS 458* 475* 424*     Chemistry Recent Labs  Lab 09/09/23 0416 09/10/23 0405 09/13/23 0407 09/14/23 0344 09/15/23 0122 09/15/23 0713  NA 137   < > 135 135 136  --   K 4.0   < > 3.8 4.0 4.2 4.1  CL 103   < > 100 102 101  --   CO2 23   < > 23 22 22   --   GLUCOSE 89   < > 103* 96 117*  --   BUN 71*   < > 76* 82* 80*  --   CREATININE 8.50*   < > 8.19* 7.92* 8.26*  --   CALCIUM 8.4*   < > 8.8* 8.7* 8.8*  --   MG 1.8  --  1.8  --  1.7  --   GFRNONAA 6*   < > 6* 6* 6*  --   ANIONGAP 11   < > 12 11 13   --    < > = values in this interval not displayed.    Recent Labs  Lab 09/13/23 0407 09/14/23 0344 09/15/23 0122  ALBUMIN 3.0* 2.8* 2.8*   Hematology Recent Labs  Lab 09/09/23 0416 09/13/23 0407 09/15/23 0128  WBC 5.0 7.2 15.6*  RBC 3.06* 3.18* 2.68*  HGB 10.0* 10.5* 9.0*  HCT 30.2* 30.8* 26.1*  MCV 98.7 96.9 97.4  MCH 32.7 33.0 33.6  MCHC 33.1 34.1 34.5  RDW 12.5 12.4 12.8  PLT 185 211 206  Radiology/Studies:  DG Bone Survey Met  Result Date: 09/14/2023 CLINICAL DATA:  Multiple myeloma. EXAM: METASTATIC BONE SURVEY COMPARISON:  Chest radiographs 09/06/2023 and abdominopelvic CT 07/03/2017. FINDINGS: Lateral view of the skull demonstrates no lytic lesions. No lytic lesions or pathologic fractures are identified within the  axial skeleton. Mild diffuse spondylosis with a grade 1 degenerative anterolisthesis at L4-5. The heart size and mediastinal contours are stable. Mild atelectasis at both lung bases. No lytic lesions, pathologic fractures or significant arthropathic changes are identified within the visualized appendicular skeleton. IMPRESSION: 1. No radiographic evidence of multiple myeloma within the axial or visualized appendicular skeleton. 2. Mild multilevel spondylosis.  No acute osseous findings. Electronically Signed   By: Carey Bullocks M.D.   On: 09/14/2023 15:15     Assessment and Plan:   1. Paroxysmal Atrial Fibrillation - This is a new diagnosis for the patient and unclear if he was having episodes prior to admission as he has overall been asymptomatic with these. He did have severe left atrial dilatation by echocardiogram this admission. His arrhythmia could also be triggered by his acute illness. - Would avoid using Digoxin going forward given his renal function. He would also not be an ideal candidate for AV nodal blocking agents given that his heart rate is in the 50's when in normal sinus rhythm. Reviewed with Dr. Diona Browner and will add PO Amiodarone 200 mg twice daily for 1 week and then can reduce to 200 mg daily. Can consider an outpatient monitor to assess for recurrence once recovered from his acute illness. - Ideally, he would be on anticoagulation and it is unclear at this time if he will require invasive procedures for his AKI. If it is determined these would not be indicated, would recommend initiation of Eliquis 2.5 mg twice daily (reduced dosing given his age and renal function).  2. HTN - He has actually been hypotensive with BP at 93/50 on most recent check. Will discontinue Amlodipine.  3. HLD - He has been continued on Atorvastatin 40 mg daily.  4. AKI - Creatinine was elevated at 7.82 on admission and remains elevated at 8.26 today. He is being followed closely by Nephrology.  5.  Parkinson's disease - On Carbidopa-Levodopa prior to admission.  Risk Assessment/Risk Scores:          CHA2DS2-VASc Score = 5   This indicates a 7.2% annual risk of stroke. The patient's score is based upon: CHF History: 0 HTN History: 1 Diabetes History: 0 Stroke History: 2 - TIA Vascular Disease History: 0 Age Score: 2 Gender Score: 0    For questions or updates, please contact [facility] Please consult [email or web address] for contact info under    Signed, Ellsworth Lennox, PA-C  09/15/2023 10:26 AM   Attending note:  Patient seen and examined.  I reviewed his records and discussed the case with Ms. Patrick Jupiter, I agree with her above findings.  Cardiology consulted due to recently documented paroxysmal atrial fibrillation as discussed above.  Patient has not been clearly symptomatic with documented arrhythmia.  He is currently admitted for further evaluation of weakness and fatigue in the setting of baseline Parkinson's disease and with newly documented acute renal failure.  Nephrology following.  Patient did have mildly elevated high-sensitivity troponin I levels at presentation in the absence of chest pain, most likely secondary to demand ischemia.  Echocardiogram obtained during this hospital stay shows normal LVEF at 60 to 65% with severe asymmetric basal septal hypertrophy, no major valvular  abnormalities.  Left atrium severely dilated.  On examination this morning patient is seated in a bedside chair, no active symptoms reported.  No carotid bruits, lungs clear, cardiac exam with RRR and soft systolic murmur, mild lower leg edema.  Pertinent lab work includes potassium 4.1, creatinine 8.26, WBC 15.6, hemoglobin 9.0, platelets 206.  ECG recently documented atrial fibrillation with IVCD/left anterior fascicular block, increased voltage.  Chest x-ray shows stable cardiomegaly and tortuous aorta with no acute findings.  Newly documented paroxysmal atrial  fibrillation.  LVEF normal and left atrium severely dilated.  CHA2DS2-VASc score is 5.  He was given a dose of digoxin by primary team overnight, would avoid further use of digoxin given his renal failure.  When in sinus rhythm heart rate is generally fairly slow in the 50s to 60s making use of AV nodal blockers an additional challenge.  Given his relatively high risk of recurring arrhythmia, plan is to initiate oral amiodarone at least in the short-term as his clinical comorbidities evolve.  Given recent blood pressures would suggest stopping Norvasc as well.  Question of initiation of anticoagulation for stroke prophylaxis will also need to be clarified, he is a reasonable candidate for DOAC, although would need to clarify timing with nephrology particularly if he is going to require an access for PD or HD.  Jonelle Sidle, M.D., F.A.C.C.

## 2023-09-15 NOTE — Progress Notes (Signed)
PROGRESS NOTE   Raymond Burton  UVO:[id number] DOB: 14-Feb-1941 DOA: 09/06/2023 PCP: Renaye Rakers, MD   Chief Complaint  Patient presents with   Fatigue   Level of care: Telemetry  Brief Admission History:  82 y.o. male with medical history significant for bradycardia, hypertension, prediabetes, Parkinson's, TIA.  Patient was brought to the ED by family with complaints of generalized weakness. 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  Family reports gradual weakness over the past several weeks to months, over the past 3 days it has been more pronounced, with patient lying in bed.  He also reports gradual onset of poor appetite in the same time period, which is different from his baseline.  He has been admitted for AKI versus progression of CKD as well as elevated troponin levels.  Nephrology has ordered further serologies and has continued IV fluid for now.  Appears to be slowly improving.  Continue to be followed by nephrology and restarted on IV fluid.   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  -UA with small hemoglobin, 30 protein -Renal ultrasound with no signs of hydronephrosis -Inserted Foley for BOO, now urine freely flowing -Appreciate ongoing nephrology evaluation -Held hydrochlorothiazide/telmisartan (25/80) -Appears that creatinine was 1.3 per PCP on 05/2023 -nephrology following closely for renal recovery -proteinuria being worked up by nephrology, with abnormal free light chains ratio will need hem/onc evaluation -family discussed with family member who is a nephrologist and request is to transfer patient to [facility] for urgent start PD which is not available at [facility].  I have called [facility] and patient has been accepted by Dr. Molli Barrows to the nephrology service. No beds are available but patient has  been marked for priority transfer and [facility] will notify us when a bed is available and arrange for transfer.     Elevated troponin Troponin 458 > 475    - Currently chest pain-free.  EKG without ST or T wave changes. - EDP talked to cardiology, does not feel that this is very concerning with normal EKG in the setting of significant renal dysfunction, should be okay for patient to stay at [facility].  -Echocardiogram with no wall motion abnormalities and preserved LVEF -IV heparin discontinued 11/29 but will be restarted 12/6 with findings of atrial fibrillation.    Paroxysmal Atrial Fibrillation new diagnosis 09/15/23  - appreciate cardiology consultation - pt has been started on rhythm control strategy - he is on amiodarone 200 mg BID  - he is a reasonable candidate for DOAC but since pending urgent PD start up at [facility] will have him on IV heparin for stroke prophylaxis    Parkinson disease Follows with neurology.  Baseline dementia, at baseline he can answer simple questions, but on bad days he is barely able to communicate. -Resumed home dose Sinemet   GERD - pantoprazole ordered for GI protection  Pre-diabetes - Hgba1c 6.0% - diet controlled   Bradycardia Sinus bradycardia by EKG.  Heart rate 49-63.  History of bradycardia.  Not on rate limiting medications. -TSH normal at 2.05   Essential hypertension -soft BPs -amlodipine discontinued   Worsening anemia of CKD No indication for ESA Iron panel ordered per nephrology   Constipation-resolved Started MiraLAX and Senokot  Leukocytosis - CXR no active findings - check urinalysis - procalcitonin not helpful given it can be  markedly elevated with ESRD     DVT prophylaxis: SCDs, IV heparin Code Status: Full Family Communication: Wife present at bedside 12/4, 12/5, 12/6 Disposition Plan: Await bed at [facility] to transfer for urgent PD startup   Consultants:  Nephrology  Procedures:   Antimicrobials:    Subjective: Pt  continues putting out urine.   Objective: Vitals:   09/15/23 0256 09/15/23 0600 09/15/23 0910 09/15/23 0945  BP: 99/61 110/65 (!) 93/50 (!) 93/50  Pulse:  99 84   Resp: 16 16 16    Temp: (!) 97.5 F (36.4 C) 97.6 F (36.4 C) 99.4 F (37.4 C)   TempSrc: Axillary Oral Oral   SpO2: 100% 96% 98%   Weight:      Height:        Intake/Output Summary (Last 24 hours) at 09/15/2023 1558 Last data filed at 09/15/2023 1005 Gross per 24 hour  Intake 840 ml  Output 950 ml  Net -110 ml   Filed Weights   09/06/23 1402 09/06/23 2013  Weight: 79.4 kg 77.3 kg   Examination:  General exam: Appears calm and comfortable. Sitting up in chair.  Respiratory system: Clear to auscultation. Respiratory effort normal. Cardiovascular system: normal S1 & S2 heard. No JVD, murmurs, rubs, gallops or clicks. No pedal edema. Gastrointestinal system: Abdomen is nondistended, soft and nontender. No organomegaly or masses felt. Normal bowel sounds heard. Central nervous system: Alert and oriented. No focal neurological deficits. Extremities: Symmetric 5 x 5 power. Skin: No rashes, lesions or ulcers. Psychiatry: Judgement and insight appear diminished. Mood & affect appropriate.   Data Reviewed: I have personally reviewed following labs and imaging studies  CBC: Recent Labs  Lab 09/09/23 0416 09/13/23 0407 09/15/23 0128  WBC 5.0 7.2 15.6*  HGB 10.0* 10.5* 9.0*  HCT 30.2* 30.8* 26.1*  MCV 98.7 96.9 97.4  PLT 185 211 206    Basic Metabolic Panel: Recent Labs  Lab 09/09/23 0416 09/10/23 0405 09/11/23 0449 09/12/23 0404 09/13/23 0407 09/14/23 0344 09/15/23 0122 09/15/23 0713  NA 137   < > 134* 131* 135 135 136  --   K 4.0   < > 4.2 3.8 3.8 4.0 4.2 4.1  CL 103   < > 100 97* 100 102 101  --   CO2 23   < > 22 22 23 22 22   --   GLUCOSE 89   < > 98 92 103* 96 117*  --   BUN 71*   < > 75* 76* 76* 82* 80*  --   CREATININE 8.50*   < > 8.05* 8.05* 8.19* 7.92* 8.26*  --   CALCIUM 8.4*   < > 8.6*  8.6* 8.8* 8.7* 8.8*  --   MG 1.8  --   --   --  1.8  --  1.7  --   PHOS 4.9*  --   --  5.2* 5.2* 4.9* 4.4  --    < > = values in this interval not displayed.    CBG: No results for input(s): "GLUCAP" in the last 168 hours.  Recent Results (from the past 240 hour(s))  Resp panel by RT-PCR (RSV, Flu A&B, Covid) Anterior Nasal Swab     Status: None   Collection Time: 09/06/23  3:21 PM   Specimen: Anterior Nasal Swab  Result Value Ref Range Status   SARS Coronavirus 2 by RT PCR NEGATIVE NEGATIVE Final    Comment: (NOTE) SARS-CoV-2 target nucleic acids are NOT DETECTED.  The SARS-CoV-2 RNA is generally  detectable in upper respiratory specimens during the acute phase of infection. The lowest concentration of SARS-CoV-2 viral copies this assay can detect is 138 copies/mL. A negative result does not preclude SARS-Cov-2 infection and should not be used as the sole basis for treatment or other patient management decisions. A negative result may occur with  improper specimen collection/handling, submission of specimen other than nasopharyngeal swab, presence of viral mutation(s) within the areas targeted by this assay, and inadequate number of viral copies(<138 copies/mL). A negative result must be combined with clinical observations, patient history, and epidemiological information. The expected result is Negative.  Fact Sheet for Patients:  BloggerCourse.com  Fact Sheet for Healthcare Providers:  SeriousBroker.it  This test is no t yet approved or cleared by the [place] FDA and  has been authorized for detection and/or diagnosis of SARS-CoV-2 by FDA under an Emergency Use Authorization (EUA). This EUA will remain  in effect (meaning this test can be used) for the duration of the COVID-19 declaration under Section 564(b)(1) of the Act, 21 U.S.C.section 360bbb-3(b)(1), unless the authorization is terminated  or revoked sooner.        Influenza A by PCR NEGATIVE NEGATIVE Final   Influenza B by PCR NEGATIVE NEGATIVE Final    Comment: (NOTE) The Xpert Xpress SARS-CoV-2/FLU/RSV plus assay is intended as an aid in the diagnosis of influenza from Nasopharyngeal swab specimens and should not be used as a sole basis for treatment. Nasal washings and aspirates are unacceptable for Xpert Xpress SARS-CoV-2/FLU/RSV testing.  Fact Sheet for Patients: BloggerCourse.com  Fact Sheet for Healthcare Providers: SeriousBroker.it  This test is not yet approved or cleared by the [place] FDA and has been authorized for detection and/or diagnosis of SARS-CoV-2 by FDA under an Emergency Use Authorization (EUA). This EUA will remain in effect (meaning this test can be used) for the duration of the COVID-19 declaration under Section 564(b)(1) of the Act, 21 U.S.C. section 360bbb-3(b)(1), unless the authorization is terminated or revoked.     Resp Syncytial Virus by PCR NEGATIVE NEGATIVE Final    Comment: (NOTE) Fact Sheet for Patients: BloggerCourse.com  Fact Sheet for Healthcare Providers: SeriousBroker.it  This test is not yet approved or cleared by the [place] FDA and has been authorized for detection and/or diagnosis of SARS-CoV-2 by FDA under an Emergency Use Authorization (EUA). This EUA will remain in effect (meaning this test can be used) for the duration of the COVID-19 declaration under Section 564(b)(1) of the Act, 21 U.S.C. section 360bbb-3(b)(1), unless the authorization is terminated or revoked.  Performed at [facility], [address], [place], [place] [address]   Surgical PCR screen     Status: None   Collection Time: 09/07/23 10:45 PM   Specimen: Nasal Mucosa; Nasal Swab  Result Value Ref Range Status   MRSA, PCR NEGATIVE NEGATIVE Final   Staphylococcus aureus NEGATIVE NEGATIVE Final     Comment: (NOTE) The Xpert SA Assay (FDA approved for NASAL specimens in patients 75 years of age and older), is one component of a comprehensive surveillance program. It is not intended to diagnose infection nor to guide or monitor treatment. Performed at [facility], [address] [place], [place] [address]      Radiology Studies: DG CHEST PORT 1 VIEW  Result Date: 09/15/2023 CLINICAL DATA:  82 year old male with bradycardia, hypertension, leukocytosis, weakness. EXAM: PORTABLE CHEST 1 VIEW COMPARISON:  Chest radiographs 09/06/2023. FINDINGS: Portable AP upright view at 0844 hours. Slightly lower lung volumes. Stable mild  cardiomegaly and tortuosity of the aorta. Visualized tracheal air column is within normal limits. Allowing for portable technique the lungs are clear. No pneumothorax or pleural effusion. Negative visible bowel gas. No acute osseous abnormality identified. IMPRESSION: No acute cardiopulmonary abnormality. Stable cardiomegaly, tortuous aorta. Electronically Signed   By: Odessa Fleming M.D.   On: 09/15/2023 11:38   DG Bone Survey Met  Result Date: 09/14/2023 CLINICAL DATA:  Multiple myeloma. EXAM: METASTATIC BONE SURVEY COMPARISON:  Chest radiographs 09/06/2023 and abdominopelvic CT 07/03/2017. FINDINGS: Lateral view of the skull demonstrates no lytic lesions. No lytic lesions or pathologic fractures are identified within the axial skeleton. Mild diffuse spondylosis with a grade 1 degenerative anterolisthesis at L4-5. The heart size and mediastinal contours are stable. Mild atelectasis at both lung bases. No lytic lesions, pathologic fractures or significant arthropathic changes are identified within the visualized appendicular skeleton. IMPRESSION: 1. No radiographic evidence of multiple myeloma within the axial or visualized appendicular skeleton. 2. Mild multilevel spondylosis.  No acute osseous findings. Electronically Signed   By: Carey Bullocks M.D.   On: 09/14/2023 15:15     Scheduled Meds:  amiodarone  200 mg Oral BID   aspirin EC  81 mg Oral Daily   atorvastatin  40 mg Oral Daily   Chlorhexidine Gluconate Cloth  6 each Topical Daily   pantoprazole  40 mg Oral QHS   polyethylene glycol  17 g Oral Daily   senna-docusate  1 tablet Oral BID   tamsulosin  0.4 mg Oral Daily   Continuous Infusions:   LOS: 9 days   Time spent: 63 mins  Davey Limas Laural Benes, MD How to contact the [facility] Attending or Consulting provider 7A - 7P or covering provider during after hours 7P -7A, for this patient?  Check the care team in [facility] and look for a) attending/consulting TR[facility] provider listed and b) the [facility] team listed Log into [email or web address] to find provider on call.  Locate the [facility] provider you are looking for under [facility] and page to a number that you can be directly reached. If you still have difficulty reaching the provider, please page the [facility] (Director on Call) for the Hospitalists listed on amion for assistance.  09/15/2023, 3:58 PM

## 2023-09-15 NOTE — Progress Notes (Signed)
   09/15/23 0056  Assess: MEWS Score  Temp 98.2 F (36.8 C)  BP 91/66  MAP (mmHg) 75  Pulse Rate (!) 120  ECG Heart Rate (!) 117  Resp 16  Level of Consciousness Alert  SpO2 91 %  O2 Device Room Air  Patient Activity (if Appropriate) In bed  Assess: MEWS Score  MEWS Temp 0  MEWS Systolic 1  MEWS Pulse 2  MEWS RR 0  MEWS LOC 0  MEWS Score 3  MEWS Score Color Yellow  Assess: if the MEWS score is Yellow or Red  Were vital signs accurate and taken at a resting state? Yes  Does the patient meet 2 or more of the SIRS criteria? No  MEWS guidelines implemented  Yes, yellow  Treat  MEWS Interventions Considered administering scheduled or prn medications/treatments as ordered  Take Vital Signs  Increase Vital Sign Frequency  Yellow: Q2hr x1, continue Q4hrs until patient remains green for 12hrs  Escalate  MEWS: Escalate Yellow: Discuss with charge nurse and consider notifying provider and/or RRT  Notify: Charge Nurse/RN  Name of Charge Nurse/RN Notified Education officer, community  Provider Notification  Provider Name/Title Andrez Grime  Date Provider Notified 09/15/23  Time Provider Notified [phone]  Method of Notification Page  Notification Reason New onset of dysrhythmia (new afib, ekg showed afib rvr)  Provider response See new orders  Date of Provider Response 09/15/23  Time of Provider Response 0100  Assess: SIRS CRITERIA  SIRS Temperature  0  SIRS Pulse 1  SIRS Respirations  0  SIRS WBC 0  SIRS Score Sum  1

## 2023-09-15 NOTE — Progress Notes (Signed)
PHARMACY - ANTICOAGULATION CONSULT NOTE  Pharmacy Consult for Heparin Indication: atrial fibrillation  No Known Allergies  Patient Measurements: Height: 5\' 9"  (175.3 cm) Weight: 77.3 kg (170 lb 6.7 oz) IBW/kg (Calculated) : 70.7 HEPARIN DW (KG): 79.4   Vital Signs: Temp: 100 F (37.8 C) (12/06 1518) Temp Source: Oral (12/06 1518) BP: 123/62 (12/06 1518) Pulse Rate: 90 (12/06 1518)  Labs: Recent Labs    09/13/23 0407 09/14/23 0344 09/15/23 0122 09/15/23 0128  HGB 10.5*  --   --  9.0*  HCT 30.8*  --   --  26.1*  PLT 211  --   --  206  CREATININE 8.19* 7.92* 8.26*  --     Estimated Creatinine Clearance: 7 mL/min (A) (by C-G formula based on SCr of 8.26 mg/dL (H)).   Medical History: Past Medical History:  Diagnosis Date   Bradycardia    Diabetes mellitus without complication (HCC)    Hyperlipidemia    Hypertension    NUCLEAR STRESS TEST, 07/17/2007 - EKG negative ischemia   Memory loss    Murmur    2D ECHO, 02/04/2009 - EF 55-65%, normal   TIA (transient ischemic attack)     Medications:  Medications Prior to Admission  Medication Sig Dispense Refill Last Dose   amLODipine (NORVASC) 10 MG tablet Take 10 mg by mouth daily.   09/06/2023   carbidopa-levodopa (SINEMET IR) 25-100 MG tablet Take 1 tablet by mouth 3 (three) times daily.   09/06/2023   GARLIC PO Take 1 tablet by mouth daily.   09/06/2023   Multiple Vitamin (MULTIVITAMIN) tablet Take 1 tablet by mouth daily.   09/06/2023   NAMZARIC 28-10 MG CP24 Take 1 capsule by mouth at bedtime.   09/05/2023   pravastatin (PRAVACHOL) 40 MG tablet TAKE 1 TABLET BY MOUTH ONCE DAILY. 90 tablet 4 09/06/2023   sertraline (ZOLOFT) 25 MG tablet Take 1 tablet by mouth daily.   09/06/2023   tamsulosin (FLOMAX) 0.4 MG CAPS capsule Take 0.4 mg by mouth daily.   09/05/2023   telmisartan-hydrochlorothiazide (MICARDIS HCT) 80-25 MG per tablet Take 1 tablet by mouth daily.   09/05/2023    Assessment: Raymond Burton is an 82 yo male  who presented to [facility] ED on 09/06/2023 for evaluation of fatigue and weakness. Was found to have an AKI with creatinine elevated at 7.82 . H/o HTN, HLD, Parkinsons, , prior TIA and bradycardia. Patient with new onset afib. Pharmacy asked to dose heparin. Patient to be evaluated by [facility] for urgent start PD which is n/a at [facility]. Not on any oral anticoagulants PTA.   Goal of Therapy:  Heparin level 0.3-0.7 units/ml Monitor platelets by anticoagulation protocol: Yes   Plan:  Give 4000 units bolus x 1 Start heparin infusion at 1100 units/hr Check anti-Xa level in ~8  hours and daily while on heparin Continue to monitor H&H and platelets  Dutch Quint, Earnesteen Birnie L 09/15/2023,4:21 PM

## 2023-09-15 NOTE — Care Management Important Message (Signed)
Important Message  Patient Details  Name: Raymond Burton MRN: [id number] Date of Birth: 1941-03-10   Important Message Given:  Yes - Medicare IM     Corey Harold 09/15/2023, 3:06 PM

## 2023-09-15 NOTE — Progress Notes (Signed)
Pt and wife notified that pt has been accepted at [facility] by MD Molli Barrows, however there are no beds available at this time. Advised that we would notify both pt and wife once bed assignment is made. Both state understanding.

## 2023-09-15 NOTE — Plan of Care (Signed)
  Problem: Health Behavior/Discharge Planning: Goal: Ability to manage health-related needs will improve Outcome: Progressing   Problem: Clinical Measurements: Goal: Ability to maintain clinical measurements within normal limits will improve Outcome: Progressing   Problem: Nutrition: Goal: Adequate nutrition will be maintained Outcome: Progressing   

## 2023-09-15 NOTE — Progress Notes (Signed)
Patient ID: Raymond Burton, male   DOB: March 22, 1941, 82 y.o.   MRN: [id number]   S: he had only 600 mL UOP charted from 12/5. Unfortunately creatinine is a little worse this morning.  Looking back it is overall largely unchanged during his hospitalization.  Course complicated overnight by Afib with RVR.  His son and his wife are at bedside.  His family has recommended urgent start PD at an outside facility to his wife; the patient is interested in in-center HD but has been very sleepy this morning for me.  Spoke mostly with his family at bedside.  They are discussing today.    Review of systems:  limited answers to me today - he is sleepy Denies shortness of breath  Appetite is reduced    O:BP (!) 93/50   Pulse 84   Temp 99.4 F (37.4 C) (Oral)   Resp 16   Ht 5\' 9"  (1.753 m)   Wt 77.3 kg   SpO2 98%   BMI 25.17 kg/m   Intake/Output Summary (Last 24 hours) at 09/15/2023 1154 Last data filed at 09/15/2023 1005 Gross per 24 hour  Intake 1080 ml  Output 950 ml  Net 130 ml   Intake/Output: I/O last 3 completed shifts: In: 1440 [P.O.:1440] Out: 2600 [Urine:2600]  Intake/Output this shift:  Total I/O In: 240 [P.O.:240] Out: 350 [Urine:350] Weight change:    General elderly male in chair in no acute distress HEENT normocephalic atraumatic extraocular movements intact sclera anicteric Neck supple trachea midline Lungs clear to auscultation bilaterally normal work of breathing at rest  Heart regular rate and rhythm no rubs or gallops appreciated Abdomen soft nontender nondistended Extremities trace edema  Psych no anxiety or agitation Neuro - oriented to person; location is ambulance; year is 2024 or 2025; sleepy GU foley in place   Recent Labs  Lab 09/09/23 0416 09/10/23 0405 09/11/23 0449 09/12/23 0404 09/13/23 0407 09/14/23 0344 09/15/23 0122 09/15/23 0713  NA 137 136 134* 131* 135 135 136  --   K 4.0 4.0 4.2 3.8 3.8 4.0 4.2 4.1  CL 103 101 100 97* 100 102 101  --    CO2 23 24 22 22 23 22 22   --   GLUCOSE 89 91 98 92 103* 96 117*  --   BUN 71* 72* 75* 76* 76* 82* 80*  --   CREATININE 8.50* 8.40* 8.05* 8.05* 8.19* 7.92* 8.26*  --   ALBUMIN 2.7*  --   --  2.8* 3.0* 2.8* 2.8*  --   CALCIUM 8.4* 8.4* 8.6* 8.6* 8.8* 8.7* 8.8*  --   PHOS 4.9*  --   --  5.2* 5.2* 4.9* 4.4  --    Liver Function Tests: Recent Labs  Lab 09/13/23 0407 09/14/23 0344 09/15/23 0122  ALBUMIN 3.0* 2.8* 2.8*   No results for input(s): "LIPASE", "AMYLASE" in the last 168 hours. No results for input(s): "AMMONIA" in the last 168 hours. CBC: Recent Labs  Lab 09/09/23 0416 09/13/23 0407 09/15/23 0128  WBC 5.0 7.2 15.6*  HGB 10.0* 10.5* 9.0*  HCT 30.2* 30.8* 26.1*  MCV 98.7 96.9 97.4  PLT 185 211 206   Cardiac Enzymes: No results for input(s): "CKTOTAL", "CKMB", "CKMBINDEX", "TROPONINI" in the last 168 hours. CBG: No results for input(s): "GLUCAP" in the last 168 hours.  Iron Studies: No results for input(s): "IRON", "TIBC", "TRANSFERRIN", "FERRITIN" in the last 72 hours. Studies/Results: DG CHEST PORT 1 VIEW  Result Date: 09/15/2023 CLINICAL DATA:  82 year old male  with bradycardia, hypertension, leukocytosis, weakness. EXAM: PORTABLE CHEST 1 VIEW COMPARISON:  Chest radiographs 09/06/2023. FINDINGS: Portable AP upright view at 0844 hours. Slightly lower lung volumes. Stable mild cardiomegaly and tortuosity of the aorta. Visualized tracheal air column is within normal limits. Allowing for portable technique the lungs are clear. No pneumothorax or pleural effusion. Negative visible bowel gas. No acute osseous abnormality identified. IMPRESSION: No acute cardiopulmonary abnormality. Stable cardiomegaly, tortuous aorta. Electronically Signed   By: Odessa Fleming M.D.   On: 09/15/2023 11:38   DG Bone Survey Met  Result Date: 09/14/2023 CLINICAL DATA:  Multiple myeloma. EXAM: METASTATIC BONE SURVEY COMPARISON:  Chest radiographs 09/06/2023 and abdominopelvic CT 07/03/2017. FINDINGS:  Lateral view of the skull demonstrates no lytic lesions. No lytic lesions or pathologic fractures are identified within the axial skeleton. Mild diffuse spondylosis with a grade 1 degenerative anterolisthesis at L4-5. The heart size and mediastinal contours are stable. Mild atelectasis at both lung bases. No lytic lesions, pathologic fractures or significant arthropathic changes are identified within the visualized appendicular skeleton. IMPRESSION: 1. No radiographic evidence of multiple myeloma within the axial or visualized appendicular skeleton. 2. Mild multilevel spondylosis.  No acute osseous findings. Electronically Signed   By: Carey Bullocks M.D.   On: 09/14/2023 15:15    amiodarone  200 mg Oral BID   aspirin EC  81 mg Oral Daily   atorvastatin  40 mg Oral Daily   Chlorhexidine Gluconate Cloth  6 each Topical Daily   pantoprazole  40 mg Oral QHS   polyethylene glycol  17 g Oral Daily   senna-docusate  1 tablet Oral BID   tamsulosin  0.4 mg Oral Daily    BMET    Component Value Date/Time   NA 136 09/15/2023 0122   K 4.1 09/15/2023 0713   CL 101 09/15/2023 0122   CO2 22 09/15/2023 0122   GLUCOSE 117 (H) 09/15/2023 0122   BUN 80 (H) 09/15/2023 0122   CREATININE 8.26 (H) 09/15/2023 0122   CALCIUM 8.8 (L) 09/15/2023 0122   GFRNONAA 6 (L) 09/15/2023 0122   CBC    Component Value Date/Time   WBC 15.6 (H) 09/15/2023 0128   RBC 2.68 (L) 09/15/2023 0128   HGB 9.0 (L) 09/15/2023 0128   HCT 26.1 (L) 09/15/2023 0128   PLT 206 09/15/2023 0128   MCV 97.4 09/15/2023 0128   MCH 33.6 09/15/2023 0128   MCHC 34.5 09/15/2023 0128   RDW 12.8 09/15/2023 0128    Assessment/Plan:   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  Presented with poor po intake in setting of concomitant ARB, diuretics, and possible bladder outlet obstruction.  Felt to be ATN.  Scr peaked at 8.5 on  09/09/23 but has had excellent UOP following foley catheter placement.  Creatinine has been largely unchanged this hospitalization.  No indication for dialysis at this time.  Continue to follow closely.  Continue to hold ARB and hydrochlorothiazide.  Also on the differential would be a paraproteinemia given his elevated lambda free chains.  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  Bone survey without radiographic evidence of multiple myeloma or lytic lesions noted  Baseline functional status is good.  His niece is a nephrologist at [employer] [facility] [employer].  She was very supportive of any decisions that her aunt and uncle may make regarding dialysis in the future (and  if they do decide to pursue dialysis she felt that they would be a great candidate for urgent start PD rather than hemo (this is available at [facility] but not here) They are discussing goals as a family - son has come in from out of town He would benefit from Hematology consult.   Immunofixation is pending Strict ins/outs Would benefit from consideration of a renal biopsy but presented with chest pain and rec'd heparin gtt and now with new diagnosis of afib and multiple prior strokes per family report BOO - per charting. s/p foley catheter placement with excellent UOP.  On flomax (here and at home) Chest pain - elevated troponin.  ECHO with severe LVH and preserved EF and RV function.  Per primary team.   Proteinuria - subnephrotic.  ANA negative, complements WNL, ANCA negative. HIV and hepatitis negative.  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  As above, immunofixation pending and would benefit from a hematology/oncology consult    Anemia - normocytic.  Possibly related to CKD.  TSAT 53%.  No indication for ESA at this time, continue to follow HTN - stable off of ARB-hydrochlorothiazide.  Parkinson's disease - cont with sinemet per primary team.  Per the patient's wife, some days he will take his time but he will most often  come up with the correct answer to questions  Atrial fibrillation with RVR - per cardiology    Disposition - continue inpatient monitoring.  Awaiting family meeting for goals of care and decision regarding PD.  If they choose to pursue urgent start PD that would be available at [facility] but not here.    Estanislado Emms, MD 12:44 PM 09/15/2023

## 2023-09-16 ENCOUNTER — Encounter (HOSPITAL_COMMUNITY): Payer: Self-pay | Admitting: Internal Medicine

## 2023-09-16 DIAGNOSIS — N179 Acute kidney failure, unspecified: Secondary | ICD-10-CM | POA: Diagnosis not present

## 2023-09-16 LAB — RENAL FUNCTION PANEL
Albumin: 2.8 g/dL — ABNORMAL LOW (ref 3.5–5.0)
Anion gap: 16 — ABNORMAL HIGH (ref 5–15)
BUN: 84 mg/dL — ABNORMAL HIGH (ref 8–23)
CO2: 20 mmol/L — ABNORMAL LOW (ref 22–32)
Calcium: 8.9 mg/dL (ref 8.9–10.3)
Chloride: 99 mmol/L (ref 98–111)
Creatinine, Ser: 8.47 mg/dL — ABNORMAL HIGH (ref 0.61–1.24)
GFR, Estimated: 6 mL/min — ABNORMAL LOW (ref >60)
Glucose, Bld: 113 mg/dL — ABNORMAL HIGH (ref 70–99)
Phosphorus: 4.4 mg/dL (ref 2.5–4.6)
Potassium: 4.3 mmol/L (ref 3.5–5.1)
Sodium: 135 mmol/L (ref 135–145)

## 2023-09-16 LAB — HEPARIN LEVEL (UNFRACTIONATED)
Heparin Unfractionated: 0.33 [IU]/mL (ref 0.30–0.70)
Heparin Unfractionated: 0.37 [IU]/mL (ref 0.30–0.70)

## 2023-09-16 LAB — DIGOXIN LEVEL: Digoxin Level: 0.8 ng/mL (ref 0.8–2.0)

## 2023-09-16 LAB — CBC
HCT: 28.8 % — ABNORMAL LOW (ref 39.0–52.0)
Hemoglobin: 9.4 g/dL — ABNORMAL LOW (ref 13.0–17.0)
MCH: 32.9 pg (ref 26.0–34.0)
MCHC: 32.6 g/dL (ref 30.0–36.0)
MCV: 100.7 fL — ABNORMAL HIGH (ref 80.0–100.0)
Platelets: 209 10*3/uL (ref 150–400)
RBC: 2.86 MIL/uL — ABNORMAL LOW (ref 4.22–5.81)
RDW: 13 % (ref 11.5–15.5)
WBC: 18.8 10*3/uL — ABNORMAL HIGH (ref 4.0–10.5)
nRBC: 0 % (ref 0.0–0.2)

## 2023-09-16 MED ORDER — SODIUM CHLORIDE 0.9 % IV SOLN
1.0000 g | INTRAVENOUS | Status: DC
  Administered 2023-09-16 – 2023-09-17 (×2): 1 g via INTRAVENOUS
  Filled 2023-09-16 (×2): qty 10

## 2023-09-16 MED ORDER — SODIUM CHLORIDE 0.9 % IV SOLN: 1.0000 g | INTRAVENOUS | Status: DC

## 2023-09-16 MED ORDER — DIGOXIN 0.25 MG/ML IJ SOLN
0.2500 mg | Freq: Once | INTRAMUSCULAR | Status: AC
  Administered 2023-09-16: 0.25 mg via INTRAVENOUS
  Filled 2023-09-16: qty 1

## 2023-09-16 MED ORDER — SODIUM CHLORIDE 0.9 % IV BOLUS
250.0000 mL | Freq: Once | INTRAVENOUS | Status: AC
  Administered 2023-09-16: 250 mL via INTRAVENOUS

## 2023-09-16 NOTE — Progress Notes (Signed)
   09/16/23 2105  Assess: MEWS Score  Temp 98 F (36.7 C)  BP 94/60  MAP (mmHg) 71  Pulse Rate 81  Resp (!) 24  SpO2 95 %  O2 Device Room Air  Assess: MEWS Score  MEWS Temp 0  MEWS Systolic 1  MEWS Pulse 0  MEWS RR 1  MEWS LOC 0  MEWS Score 2  MEWS Score Color Yellow  Assess: if the MEWS score is Yellow or Red  Were vital signs accurate and taken at a resting state? Yes  Does the patient meet 2 or more of the SIRS criteria? Yes  Does the patient have a confirmed or suspected source of infection? No  MEWS guidelines implemented  Yes, yellow  Treat  MEWS Interventions Considered administering scheduled or prn medications/treatments as ordered  Take Vital Signs  Increase Vital Sign Frequency  Yellow: Q2hr x1, continue Q4hrs until patient remains green for 12hrs  Escalate  MEWS: Escalate Yellow: Discuss with charge nurse and consider notifying provider and/or RRT  Notify: Charge Nurse/RN  Name of Charge Nurse/RN Notified Alan Ripper, RN  Provider Notification  Provider Name/Title Arville Care, MD  Date Provider Notified 09/16/23  Time Provider Notified 2207  Method of Notification Page  Notification Reason Change in status  Assess: SIRS CRITERIA  SIRS Temperature  0  SIRS Pulse 0  SIRS Respirations  1  SIRS WBC 0  SIRS Score Sum  1

## 2023-09-16 NOTE — Progress Notes (Signed)
Waiting for bed at [facility].

## 2023-09-16 NOTE — Plan of Care (Signed)
  Problem: Health Behavior/Discharge Planning: Goal: Ability to manage health-related needs will improve Outcome: Progressing   

## 2023-09-16 NOTE — Progress Notes (Signed)
Pt in a-fib, HR up to 140. MD notified- one time dose of digoxin given by RN and pt back in sinus rhythm.

## 2023-09-16 NOTE — Progress Notes (Signed)
PHARMACY - ANTICOAGULATION CONSULT NOTE  Pharmacy Consult for Heparin Indication: atrial fibrillation  No Known Allergies  Patient Measurements: Height: 5\' 9"  (175.3 cm) Weight: 77.3 kg (170 lb 6.7 oz) IBW/kg (Calculated) : 70.7 HEPARIN DW (KG): 79.4   Vital Signs: Temp: 97.7 F (36.5 C) (12/07 0624) Temp Source: Oral (12/07 0624) BP: 101/67 (12/07 0624) Pulse Rate: 89 (12/07 0624)  Labs: Recent Labs    09/14/23 0344 09/15/23 0122 09/15/23 0128 09/16/23 0005 09/16/23 0440  HGB  --   --  9.0*  --  9.4*  HCT  --   --  26.1*  --  28.8*  PLT  --   --  206  --  209  HEPARINUNFRC  --   --   --  0.37 0.33  CREATININE 7.92* 8.26*  --   --  8.47*    Estimated Creatinine Clearance: 6.8 mL/min (A) (by C-G formula based on SCr of 8.47 mg/dL (H)).   Medical History: Past Medical History:  Diagnosis Date   Bradycardia    Diabetes mellitus without complication (HCC)    Hyperlipidemia    Hypertension    NUCLEAR STRESS TEST, 07/17/2007 - EKG negative ischemia   Memory loss    Murmur    2D ECHO, 02/04/2009 - EF 55-65%, normal   TIA (transient ischemic attack)     Medications:  Medications Prior to Admission  Medication Sig Dispense Refill Last Dose   amLODipine (NORVASC) 10 MG tablet Take 10 mg by mouth daily.   09/06/2023   carbidopa-levodopa (SINEMET IR) 25-100 MG tablet Take 1 tablet by mouth 3 (three) times daily.   09/06/2023   GARLIC PO Take 1 tablet by mouth daily.   09/06/2023   Multiple Vitamin (MULTIVITAMIN) tablet Take 1 tablet by mouth daily.   09/06/2023   NAMZARIC 28-10 MG CP24 Take 1 capsule by mouth at bedtime.   09/05/2023   pravastatin (PRAVACHOL) 40 MG tablet TAKE 1 TABLET BY MOUTH ONCE DAILY. 90 tablet 4 09/06/2023   sertraline (ZOLOFT) 25 MG tablet Take 1 tablet by mouth daily.   09/06/2023   tamsulosin (FLOMAX) 0.4 MG CAPS capsule Take 0.4 mg by mouth daily.   09/05/2023   telmisartan-hydrochlorothiazide (MICARDIS HCT) 80-25 MG per tablet Take 1 tablet  by mouth daily.   09/05/2023    Assessment: Mr. Hammerschmidt is an 82 yo male who presented to [facility], ED on 09/06/2023 for evaluation of fatigue and weakness. Was found to have an AKI with creatinine elevated at 7.82 . H/o HTN, HLD, Parkinsons, , prior TIA and bradycardia. Patient with new onset afib. Pharmacy asked to dose heparin. Patient to be evaluated by [facility] for urgent start PD which is n/a at [facility]. Not on any oral anticoagulants PTA.  HL 0.37> 0.33, remains therapeutic. No issues with infusion  Goal of Therapy:  Heparin level 0.3-0.7 units/ml Monitor platelets by anticoagulation protocol: Yes   Plan:  Continue heparin infusion at 1100 units/hr Check anti-Xa level  daily while on heparin Continue to monitor H&H and platelets  Elder Cyphers, BS Pharm D, BCPS Clinical Pharmacist 09/16/2023,9:52 AM

## 2023-09-16 NOTE — Progress Notes (Signed)
   09/16/23 2313  Assess: MEWS Score  Temp 98.7 F (37.1 C)  BP 100/66  MAP (mmHg) 78  Pulse Rate (!) 106  Resp (!) 22  Assess: MEWS Score  MEWS Temp 0  MEWS Systolic 1  MEWS Pulse 1  MEWS RR 1  MEWS LOC 0  MEWS Score 3  MEWS Score Color Yellow  Assess: if the MEWS score is Yellow or Red  Were vital signs accurate and taken at a resting state? Yes  Does the patient meet 2 or more of the SIRS criteria? Yes  Does the patient have a confirmed or suspected source of infection? No  MEWS guidelines implemented  Yes, yellow  Treat  MEWS Interventions Considered administering scheduled or prn medications/treatments as ordered  Take Vital Signs  Increase Vital Sign Frequency  Yellow: Q2hr x1, continue Q4hrs until patient remains green for 12hrs  Escalate  MEWS: Escalate Yellow: Discuss with charge nurse and consider notifying provider and/or RRT  Assess: SIRS CRITERIA  SIRS Temperature  0  SIRS Pulse 1  SIRS Respirations  1  SIRS WBC 0  SIRS Score Sum  2

## 2023-09-16 NOTE — Progress Notes (Addendum)
PROGRESS NOTE   Raymond Burton  ZOX:[id number] DOB: [birth year] DOA: 09/06/2023 PCP: Renaye Rakers, MD   Chief Complaint  Patient presents with   Fatigue   Level of care: Telemetry  Brief Admission History:  82 y.o. male with medical history significant for bradycardia, hypertension, prediabetes, Parkinson's, TIA.  Patient was brought to the ED by family with complaints of generalized weakness. 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  Family reports gradual weakness over the past several weeks to months, over the past 3 days it has been more pronounced, with patient lying in bed.  He also reports gradual onset of poor appetite in the same time period, which is different from his baseline.  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  He is on IV heparin for stroke prevention due to likely upcoming procedures for PD start up.  Nephrology has ordered further serologies.  Continues to be followed by nephrology and currently waiting for bed at [facility] with tentative plan for urgent PD start up.    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  -UA with small hemoglobin, 30 protein -Renal ultrasound with no signs of hydronephrosis -Inserted Foley for BOO, replaced foley 12/7 -Appreciate ongoing nephrology evaluation -Held hydrochlorothiazide/telmisartan (25/80) -Appears that creatinine was 1.3 per PCP on 05/2023 -nephrology following closely for renal recovery -proteinuria being worked up by nephrology, with abnormal free light chains ratio will need hem/onc evaluation -family discussed with family member who is a nephrologist and request is to transfer patient to [facility] for urgent start PD which is not available at [facility].  I have  called [facility] and patient has been accepted by Dr. Molli Barrows to the nephrology service. No beds are available but patient has been marked for priority transfer and [facility] will notify us when a bed is available and arrange for transfer.   - I updated [facility] today 12/7 regarding current clinical condition and overnight changes   Elevated troponin Troponin 458 > 475    - Currently chest pain-free.  EKG without ST or T wave changes. - EDP talked to cardiology, does not feel that this is very concerning with normal EKG in the setting of significant renal dysfunction, should be okay for patient to stay at [facility].  -Echocardiogram with no wall motion abnormalities and preserved LVEF -IV heparin discontinued 11/29 but will be restarted 12/6 with findings of atrial fibrillation.    UTI - replaced foley 12/7 - foley placed for bladder outlet obstruction - IV CTX x 3 doses   Leukocytosis - secondary to UTI - IV CTX ordered - BC x 2 - CXR no acute findings  Paroxysmal Atrial Fibrillation new diagnosis 09/15/23  - appreciate cardiology consultation - pt has been started on rhythm control strategy - he is on amiodarone 200 mg BID  - he is a reasonable candidate for [facility] but since pending urgent PD start up at [facility] will have him on IV heparin for stroke prophylaxis  - pt had Afib RVR overnight and given another small dose of IV digoxin.  Would not give more digoxin given his ESRD - added digoxin to allergy list to prompt any provider to avoid giving this  - he is on IV heparin for  stroke prevention due to upcoming procedures for urgent PD startup   Parkinson disease Follows with neurology.  Baseline dementia, at baseline he can answer simple questions, but on bad days he is barely able to communicate. -Resumed home dose Sinemet   GERD - pantoprazole ordered for GI protection  Pre-diabetes - Hgba1c 6.0% - diet controlled   Bradycardia Sinus bradycardia by EKG.  Heart rate 49-63.  History of  bradycardia.  Not on rate limiting medications. -TSH normal at 2.05   Essential hypertension -soft BPs -amlodipine discontinued   Worsening anemia of CKD No indication for ESA Iron panel ordered per nephrology   Constipation-resolved Started MiraLAX and Senokot  Leukocytosis - CXR no active findings - check urinalysis - procalcitonin not helpful given it can be markedly elevated with ESRD     DVT prophylaxis: SCDs, IV heparin Code Status: Full Family Communication: Wife present at bedside 12/4, 12/5, 12/6, son 12/7 Disposition Plan: Await bed at [facility] to transfer for urgent PD startup   Consultants:  Nephrology  Procedures:   Antimicrobials:    Subjective: Eating a little better per son.    Objective: Vitals:   09/15/23 1925 09/15/23 2100 09/16/23 0624 09/16/23 1340  BP: (!) 101/56  101/67 90/68  Pulse: 79  89 91  Resp: 20  14 18   Temp: (!) 100.4 F (38 C) 99.4 F (37.4 C) 97.7 F (36.5 C)   TempSrc: Oral  Oral   SpO2: 94%  95%   Weight:      Height:        Intake/Output Summary (Last 24 hours) at 09/16/2023 1523 Last data filed at 09/16/2023 1048 Gross per 24 hour  Intake 520.22 ml  Output 1300 ml  Net -779.78 ml   Filed Weights   09/06/23 1402 09/06/23 2013  Weight: 79.4 kg 77.3 kg   Examination:  General exam: Appears calm and comfortable. Sitting up in chair.  Respiratory system: Clear to auscultation. Respiratory effort normal. Cardiovascular system: normal S1 & S2 heard. No JVD, murmurs, rubs, gallops or clicks. No pedal edema. Gastrointestinal system: Abdomen is nondistended, soft and nontender. No organomegaly or masses felt. Normal bowel sounds heard. Central nervous system: Alert and oriented. No focal neurological deficits. Extremities: Symmetric 5 x 5 power. Skin: No rashes, lesions or ulcers. Psychiatry: Judgement and insight appear diminished. Mood & affect appropriate.   Data Reviewed: I have personally reviewed following labs and  imaging studies  CBC: Recent Labs  Lab 09/13/23 0407 09/15/23 0128 09/16/23 0440  WBC 7.2 15.6* 18.8*  HGB 10.5* 9.0* 9.4*  HCT 30.8* 26.1* 28.8*  MCV 96.9 97.4 100.7*  PLT 211 206 209    Basic Metabolic Panel: Recent Labs  Lab 09/12/23 0404 09/13/23 0407 09/14/23 0344 09/15/23 0122 09/15/23 0713 09/16/23 0440  NA 131* 135 135 136  --  135  K 3.8 3.8 4.0 4.2 4.1 4.3  CL 97* 100 102 101  --  99  CO2 22 23 22 22   --  20*  GLUCOSE 92 103* 96 117*  --  113*  BUN 76* 76* 82* 80*  --  84*  CREATININE 8.05* 8.19* 7.92* 8.26*  --  8.47*  CALCIUM 8.6* 8.8* 8.7* 8.8*  --  8.9  MG  --  1.8  --  1.7  --   --   PHOS 5.2* 5.2* 4.9* 4.4  --  4.4    CBG: No results for input(s): "GLUCAP" in the last 168 hours.  Recent Results (from the past  240 hour(s))  Surgical PCR screen     Status: None   Collection Time: 09/07/23 10:45 PM   Specimen: Nasal Mucosa; Nasal Swab  Result Value Ref Range Status   MRSA, PCR NEGATIVE NEGATIVE Final   Staphylococcus aureus NEGATIVE NEGATIVE Final    Comment: (NOTE) The Xpert SA Assay (FDA approved for NASAL specimens in patients 65 years of age and older), is one component of a comprehensive surveillance program. It is not intended to diagnose infection nor to guide or monitor treatment. Performed at [facility], [address] [place], [place] [address]   Culture, blood (Routine X 2) w Reflex to ID Panel     Status: None (Preliminary result)   Collection Time: 09/16/23  8:55 AM   Specimen: BLOOD RIGHT HAND  Result Value Ref Range Status   Specimen Description   Final    BLOOD RIGHT HAND BOTTLES DRAWN AEROBIC AND ANAEROBIC   Special Requests   Final    Blood Culture adequate volume Performed at [facility], [address] [place], [place] [address]    Culture PENDING  Incomplete   Report Status PENDING  Incomplete  Culture, blood (Routine X 2) w Reflex to ID Panel     Status: None (Preliminary result)   Collection Time: 09/16/23  8:55  AM   Specimen: BLOOD LEFT HAND  Result Value Ref Range Status   Specimen Description   Final    BLOOD LEFT HAND BOTTLES DRAWN AEROBIC AND ANAEROBIC   Special Requests   Final    Blood Culture adequate volume Performed at [facility], [address] [place], [place] [address]    Culture PENDING  Incomplete   Report Status PENDING  Incomplete     Radiology Studies: DG CHEST PORT 1 VIEW  Result Date: 09/15/2023 CLINICAL DATA:  82 year old male with bradycardia, hypertension, leukocytosis, weakness. EXAM: PORTABLE CHEST 1 VIEW COMPARISON:  Chest radiographs 09/06/2023. FINDINGS: Portable AP upright view at 0844 hours. Slightly lower lung volumes. Stable mild cardiomegaly and tortuosity of the aorta. Visualized tracheal air column is within normal limits. Allowing for portable technique the lungs are clear. No pneumothorax or pleural effusion. Negative visible bowel gas. No acute osseous abnormality identified. IMPRESSION: No acute cardiopulmonary abnormality. Stable cardiomegaly, tortuous aorta. Electronically Signed   By: Odessa Fleming M.D.   On: 09/15/2023 11:38    Scheduled Meds:  amiodarone  200 mg Oral BID   aspirin EC  81 mg Oral Daily   atorvastatin  40 mg Oral Daily   Chlorhexidine Gluconate Cloth  6 each Topical Daily   pantoprazole  40 mg Oral QHS   polyethylene glycol  17 g Oral Daily   senna-docusate  1 tablet Oral BID   tamsulosin  0.4 mg Oral Daily   Continuous Infusions:  cefTRIAXone (ROCEPHIN)  IV 1 g (09/16/23 1013)   heparin 1,100 Units/hr (09/16/23 1335)     LOS: 10 days   Time spent: 55 mins  Raymond Mirsky Laural Benes, MD How to contact the [facility] Attending or Consulting provider 7A - 7P or covering provider during after hours 7P -7A, for this patient?  Check the care team in [facility] and look for a) attending/consulting TRH provider listed and b) the [facility] team listed Log into [email or web address] to find provider on call.  Locate the [facility] provider you are looking for under [facility] and page to a number that you can be directly reached. If you still have difficulty reaching the provider, please page the [facility] (Director  on Call) for the Hospitalists listed on amion for assistance.  09/16/2023, 3:23 PM

## 2023-09-17 DIAGNOSIS — I10 Essential (primary) hypertension: Secondary | ICD-10-CM | POA: Diagnosis not present

## 2023-09-17 DIAGNOSIS — N179 Acute kidney failure, unspecified: Secondary | ICD-10-CM | POA: Diagnosis not present

## 2023-09-17 DIAGNOSIS — I480 Paroxysmal atrial fibrillation: Secondary | ICD-10-CM | POA: Diagnosis not present

## 2023-09-17 DIAGNOSIS — G20A1 Parkinson's disease without dyskinesia, without mention of fluctuations: Secondary | ICD-10-CM | POA: Diagnosis not present

## 2023-09-17 LAB — RENAL FUNCTION PANEL
Albumin: 2.8 g/dL — ABNORMAL LOW (ref 3.5–5.0)
Anion gap: 13 (ref 5–15)
BUN: 90 mg/dL — ABNORMAL HIGH (ref 8–23)
CO2: 20 mmol/L — ABNORMAL LOW (ref 22–32)
Calcium: 8.7 mg/dL — ABNORMAL LOW (ref 8.9–10.3)
Chloride: 100 mmol/L (ref 98–111)
Creatinine, Ser: 8.95 mg/dL — ABNORMAL HIGH (ref 0.61–1.24)
GFR, Estimated: 5 mL/min — ABNORMAL LOW (ref >60)
Glucose, Bld: 99 mg/dL (ref 70–99)
Phosphorus: 4.2 mg/dL (ref 2.5–4.6)
Potassium: 4.3 mmol/L (ref 3.5–5.1)
Sodium: 133 mmol/L — ABNORMAL LOW (ref 135–145)

## 2023-09-17 LAB — HEPARIN LEVEL (UNFRACTIONATED)
Heparin Unfractionated: 0.22 [IU]/mL — ABNORMAL LOW (ref 0.30–0.70)
Heparin Unfractionated: 0.4 [IU]/mL (ref 0.30–0.70)

## 2023-09-17 LAB — CBC
HCT: 27.4 % — ABNORMAL LOW (ref 39.0–52.0)
Hemoglobin: 9.1 g/dL — ABNORMAL LOW (ref 13.0–17.0)
MCH: 33.1 pg (ref 26.0–34.0)
MCHC: 33.2 g/dL (ref 30.0–36.0)
MCV: 99.6 fL (ref 80.0–100.0)
Platelets: 214 10*3/uL (ref 150–400)
RBC: 2.75 MIL/uL — ABNORMAL LOW (ref 4.22–5.81)
RDW: 13.2 % (ref 11.5–15.5)
WBC: 18.4 10*3/uL — ABNORMAL HIGH (ref 4.0–10.5)
nRBC: 0 % (ref 0.0–0.2)

## 2023-09-17 LAB — HEPATITIS B SURFACE ANTIGEN: Hepatitis B Surface Ag: NONREACTIVE

## 2023-09-17 MED ORDER — CHLORHEXIDINE GLUCONATE CLOTH 2 % EX PADS
6.0000 | MEDICATED_PAD | Freq: Once | CUTANEOUS | Status: AC
  Administered 2023-09-17: 6 via TOPICAL

## 2023-09-17 MED ORDER — CHLORHEXIDINE GLUCONATE CLOTH 2 % EX PADS
6.0000 | MEDICATED_PAD | Freq: Every day | CUTANEOUS | Status: DC
  Administered 2023-09-18 – 2023-09-20 (×3): 6 via TOPICAL

## 2023-09-17 MED ORDER — HEPARIN BOLUS VIA INFUSION
1000.0000 [IU] | Freq: Once | INTRAVENOUS | Status: AC
  Administered 2023-09-17: 1000 [IU] via INTRAVENOUS
  Filled 2023-09-17: qty 1000

## 2023-09-17 MED ORDER — CHLORHEXIDINE GLUCONATE CLOTH 2 % EX PADS
6.0000 | MEDICATED_PAD | Freq: Once | CUTANEOUS | Status: AC
Start: 2023-09-17 — End: 2023-09-17
  Administered 2023-09-17: 6 via TOPICAL

## 2023-09-17 NOTE — Progress Notes (Addendum)
PHARMACY - ANTICOAGULATION CONSULT NOTE  Pharmacy Consult for Heparin Indication: atrial fibrillation  Allergies  Allergen Reactions   Digoxin And Related Other (See Comments)    Patient Measurements: Height: 5\' 9"  (175.3 cm) Weight: 77.3 kg (170 lb 6.7 oz) IBW/kg (Calculated) : 70.7 HEPARIN DW (KG): 79.4   Vital Signs: Temp: 97.8 F (36.6 C) (12/08 0719) Temp Source: Oral (12/08 0719) BP: 82/53 (12/08 1120) Pulse Rate: 69 (12/08 1120)  Labs: Recent Labs    09/15/23 0122 09/15/23 0128 09/15/23 0128 09/16/23 0005 09/16/23 0440 09/17/23 0342  HGB  --  9.0*   < >  --  9.4* 9.1*  HCT  --  26.1*  --   --  28.8* 27.4*  PLT  --  206  --   --  209 214  HEPARINUNFRC  --   --   --  0.37 0.33 0.22*  CREATININE 8.26*  --   --   --  8.47* 8.95*   < > = values in this interval not displayed.    Estimated Creatinine Clearance: 6.5 mL/min (A) (by C-G formula based on SCr of 8.95 mg/dL (H)).   Medical History: Past Medical History:  Diagnosis Date   Bradycardia    Diabetes mellitus without complication (HCC)    Hyperlipidemia    Hypertension    NUCLEAR STRESS TEST, 07/17/2007 - EKG negative ischemia   Memory loss    Murmur    2D ECHO, 02/04/2009 - EF 55-65%, normal   TIA (transient ischemic attack)     Medications:  Medications Prior to Admission  Medication Sig Dispense Refill Last Dose   amLODipine (NORVASC) 10 MG tablet Take 10 mg by mouth daily.   09/06/2023   carbidopa-levodopa (SINEMET IR) 25-100 MG tablet Take 1 tablet by mouth 3 (three) times daily.   09/06/2023   GARLIC PO Take 1 tablet by mouth daily.   09/06/2023   Multiple Vitamin (MULTIVITAMIN) tablet Take 1 tablet by mouth daily.   09/06/2023   NAMZARIC 28-10 MG CP24 Take 1 capsule by mouth at bedtime.   09/05/2023   pravastatin (PRAVACHOL) 40 MG tablet TAKE 1 TABLET BY MOUTH ONCE DAILY. 90 tablet 4 09/06/2023   sertraline (ZOLOFT) 25 MG tablet Take 1 tablet by mouth daily.   09/06/2023   tamsulosin  (FLOMAX) 0.4 MG CAPS capsule Take 0.4 mg by mouth daily.   09/05/2023   telmisartan-hydrochlorothiazide (MICARDIS HCT) 80-25 MG per tablet Take 1 tablet by mouth daily.   09/05/2023    Assessment: Raymond Burton is an 82 yo male who presented to [facility], ED on 09/06/2023 for evaluation of fatigue and weakness. Was found to have an AKI with creatinine elevated at 7.82 . H/o HTN, HLD, Parkinsons, , prior TIA and bradycardia. Patient with new onset afib. Pharmacy asked to dose heparin. Patient to be evaluated by [facility] for urgent start PD which is n/a at [facility]. Not on any oral anticoagulants PTA.  HL 0.37> 0.33> 0.22  subtherapeutic. No issues with infusion per RN. Will adjust  Goal of Therapy:  Heparin level 0.3-0.7 units/ml Monitor platelets by anticoagulation protocol: Yes   Plan:  Heparin bolus 1000 units then,  Increase heparin infusion at 1250 units/hr Check anti-Xa level in 8 hours and daily while on heparin Continue to monitor H&H and platelets  Elder Cyphers, BS Pharm D, BCPS Clinical Pharmacist 09/17/2023,11:23 AM

## 2023-09-17 NOTE — Progress Notes (Signed)
PHARMACY - ANTICOAGULATION CONSULT NOTE  Pharmacy Consult for Heparin Indication: atrial fibrillation  Allergies  Allergen Reactions   Digoxin And Related Other (See Comments)    Patient Measurements: Height: 5\' 9"  (175.3 cm) Weight: 77.3 kg (170 lb 6.7 oz) IBW/kg (Calculated) : 70.7 HEPARIN DW (KG): 79.4   Vital Signs: Temp: 98.2 F (36.8 C) (12/08 1549) Temp Source: Oral (12/08 1549) BP: 92/55 (12/08 1549) Pulse Rate: 80 (12/08 1549)  Labs: Recent Labs     0000 09/15/23 0122 09/15/23 0128 09/16/23 0005 09/16/23 0440 09/17/23 0342 09/17/23 1854  HGB   < >  --  9.0*  --  9.4* 9.1*  --   HCT  --   --  26.1*  --  28.8* 27.4*  --   PLT  --   --  206  --  209 214  --   HEPARINUNFRC  --   --   --    < > 0.33 0.22* 0.40  CREATININE  --  8.26*  --   --  8.47* 8.95*  --    < > = values in this interval not displayed.    Estimated Creatinine Clearance: 6.5 mL/min (A) (by C-G formula based on SCr of 8.95 mg/dL (H)).   Medical History: Past Medical History:  Diagnosis Date   Bradycardia    Diabetes mellitus without complication (HCC)    Hyperlipidemia    Hypertension    NUCLEAR STRESS TEST, 07/17/2007 - EKG negative ischemia   Memory loss    Murmur    2D ECHO, 02/04/2009 - EF 55-65%, normal   TIA (transient ischemic attack)     Medications:  Medications Prior to Admission  Medication Sig Dispense Refill Last Dose   amLODipine (NORVASC) 10 MG tablet Take 10 mg by mouth daily.   09/06/2023   carbidopa-levodopa (SINEMET IR) 25-100 MG tablet Take 1 tablet by mouth 3 (three) times daily.   09/06/2023   GARLIC PO Take 1 tablet by mouth daily.   09/06/2023   Multiple Vitamin (MULTIVITAMIN) tablet Take 1 tablet by mouth daily.   09/06/2023   NAMZARIC 28-10 MG CP24 Take 1 capsule by mouth at bedtime.   09/05/2023   pravastatin (PRAVACHOL) 40 MG tablet TAKE 1 TABLET BY MOUTH ONCE DAILY. 90 tablet 4 09/06/2023   sertraline (ZOLOFT) 25 MG tablet Take 1 tablet by mouth daily.    09/06/2023   tamsulosin (FLOMAX) 0.4 MG CAPS capsule Take 0.4 mg by mouth daily.   09/05/2023   telmisartan-hydrochlorothiazide (MICARDIS HCT) 80-25 MG per tablet Take 1 tablet by mouth daily.   09/05/2023    Assessment: Raymond Burton is an 82 yo male who presented to [facility], ED on 09/06/2023 for evaluation of fatigue and weakness. Was found to have an AKI with creatinine elevated at 7.82 . H/o HTN, HLD, Parkinsons, , prior TIA and bradycardia. Patient with new onset afib. Pharmacy asked to dose heparin. Patient to be evaluated by [facility] for urgent start PD which is n/a at [facility]. Not on any oral anticoagulants PTA.  Heparin level is therapeutic at 0.40 on 1250 units/hour. No issues with infusion reported, no signs/symptoms of bleeding noted.   Goal of Therapy:  Heparin level 0.3-0.7 units/ml Monitor platelets by anticoagulation protocol: Yes   Plan:  Continue heparin infusion at 1250 units/hr Monitor daily heparin levels while on heparin infusion Continue to monitor H&H and platelets  Thank you for involving pharmacy in this patient's care.   Rockwell Alexandria, PharmD Clinical Pharmacist  09/17/2023 7:38 PM

## 2023-09-17 NOTE — Consult Note (Signed)
[facility] Consult  Reason for Consult: HD catheter needed for worsening renal failure  Referring Physician: Dr. Laural Benes  Chief Complaint   Fatigue     HPI: Raymond Burton is a 82 y.o. male with worsening renal failure and plan to go to [facility] for Peritoneal dialysis but they have no beds. He is needing temporary dialysis catheter. He has Parkinson's dementia, HTN, prediabetes, and A fib on heparin gtt. I have been asked to place a HD catheter. His family is at the bedside.   Past Medical History:  Diagnosis Date   Bradycardia    Diabetes mellitus without complication (HCC)    Hyperlipidemia    Hypertension    NUCLEAR STRESS TEST, 07/17/2007 - EKG negative ischemia   Memory loss    Murmur    2D ECHO, 02/04/2009 - EF 55-65%, normal   TIA (transient ischemic attack)     History reviewed. No pertinent surgical history.  Family History  Problem Relation Age of Onset   Heart attack Mother    Stroke Mother     Social History   Tobacco Use   Smoking status: Never   Smokeless tobacco: Never   Tobacco comments:    "smoked for about 2 weeks, gave him a headache"  Substance Use Topics   Alcohol use: No   Drug use: No    Medications: I have reviewed the patient's current medications. Prior to Admission:  Medications Prior to Admission  Medication Sig Dispense Refill Last Dose   amLODipine (NORVASC) 10 MG tablet Take 10 mg by mouth daily.   09/06/2023   carbidopa-levodopa (SINEMET IR) 25-100 MG tablet Take 1 tablet by mouth 3 (three) times daily.   09/06/2023   GARLIC PO Take 1 tablet by mouth daily.   09/06/2023   Multiple Vitamin (MULTIVITAMIN) tablet Take 1 tablet by mouth daily.   09/06/2023   NAMZARIC 28-10 MG CP24 Take 1 capsule by mouth at bedtime.   09/05/2023   pravastatin (PRAVACHOL) 40 MG tablet TAKE 1 TABLET BY MOUTH ONCE DAILY. 90 tablet 4 09/06/2023   sertraline (ZOLOFT) 25 MG tablet Take 1 tablet by mouth daily.   09/06/2023   tamsulosin  (FLOMAX) 0.4 MG CAPS capsule Take 0.4 mg by mouth daily.   09/05/2023   telmisartan-hydrochlorothiazide (MICARDIS HCT) 80-25 MG per tablet Take 1 tablet by mouth daily.   09/05/2023   Scheduled:  amiodarone  200 mg Oral BID   aspirin EC  81 mg Oral Daily   atorvastatin  40 mg Oral Daily   Chlorhexidine Gluconate Cloth  6 each Topical Daily   heparin  1,000 Units Intravenous Once   pantoprazole  40 mg Oral QHS   polyethylene glycol  17 g Oral Daily   senna-docusate  1 tablet Oral BID   tamsulosin  0.4 mg Oral Daily   Continuous:  cefTRIAXone (ROCEPHIN)  IV 1 g (09/17/23 0909)   heparin 1,250 Units/hr (09/17/23 1150)   ZOX:[id number] **OR** acetaminophen, alum & mag hydroxide-simeth, LORazepam, naphazoline-glycerin, ondansetron **OR** ondansetron (ZOFRAN) IV, traZODone  Allergies  Allergen Reactions   Digoxin And Related Other (See Comments)     ROS:  A comprehensive review of systems was negative except for: Cardiovascular: positive for A fib Genitourinary: positive for worsening renal failure Neurological: positive for parkinson's disease   Blood pressure (!) 90/58, pulse 76, temperature 97.8 F (36.6 C), temperature source Oral, resp. rate 16, height 5\' 9"  (1.753 m), weight 77.3 kg, SpO2 95%. Physical Exam Vitals reviewed.  HENT:  Head: Normocephalic.  Eyes:     Extraocular Movements: Extraocular movements intact.  Cardiovascular:     Rate and Rhythm: Normal rate.  Pulmonary:     Effort: Pulmonary effort is normal.  Abdominal:     General: There is no distension.     Palpations: Abdomen is soft.  Musculoskeletal:        General: No swelling.  Skin:    General: Skin is warm.  Neurological:     Mental Status: He is alert.     Motor: Weakness present.     Comments: Confused   Psychiatric:        Mood and Affect: Mood normal.        Behavior: Behavior normal.     Results: Results for orders placed or performed during the hospital encounter of 09/06/23  (from the past 48 hour(s))  Heparin level (unfractionated)     Status: None   Collection Time: 09/16/23 12:05 AM  Result Value Ref Range   Heparin Unfractionated 0.37 0.30 - 0.70 IU/mL    Comment: (NOTE) The clinical reportable range upper limit is being lowered to >1.10 to align with the FDA approved guidance for the current laboratory assay.  If heparin results are below expected values, and patient dosage has  been confirmed, suggest follow up testing of antithrombin III levels. Performed at [facility], [address], [place], [place] [address]   Renal function panel     Status: Abnormal   Collection Time: 09/16/23  4:40 AM  Result Value Ref Range   Sodium 135 135 - 145 mmol/L   Potassium 4.3 3.5 - 5.1 mmol/L   Chloride 99 98 - 111 mmol/L   CO2 20 (L) 22 - 32 mmol/L   Glucose, Bld 113 (H) 70 - 99 mg/dL    Comment: Glucose reference range applies only to samples taken after fasting for at least 8 hours.   BUN 84 (H) 8 - 23 mg/dL   Creatinine, Ser 9.62 (H) 0.61 - 1.24 mg/dL   Calcium 8.9 8.9 - 95.2 mg/dL   Phosphorus 4.4 2.5 - 4.6 mg/dL   Albumin 2.8 (L) 3.5 - 5.0 g/dL   GFR, Estimated 6 (L) >60 mL/min    Comment: (NOTE) Calculated using the CKD-EPI Creatinine Equation (2021)    Anion gap 16 (H) 5 - 15    Comment: Performed at [facility], [address] [place], [place] [address]  CBC     Status: Abnormal   Collection Time: 09/16/23  4:40 AM  Result Value Ref Range   WBC 18.8 (H) 4.0 - 10.5 K/uL   RBC 2.86 (L) 4.22 - 5.81 MIL/uL   Hemoglobin 9.4 (L) 13.0 - 17.0 g/dL   HCT 44.0 (L) 10.2 - 72.5 %   MCV 100.7 (H) 80.0 - 100.0 fL   MCH 32.9 26.0 - 34.0 pg   MCHC 32.6 30.0 - 36.0 g/dL   RDW 36.6 44.0 - 34.7 %   Platelets 209 150 - 400 K/uL   nRBC 0.0 0.0 - 0.2 %    Comment: Performed at [facility], [address] [place], [place] [address]  Heparin level (unfractionated)     Status: None   Collection Time: 09/16/23  4:40 AM  Result Value Ref Range   Heparin  Unfractionated 0.33 0.30 - 0.70 IU/mL    Comment: (NOTE) The clinical reportable range upper limit is being lowered to >1.10 to align with the FDA approved guidance for the current laboratory assay.  If heparin  results are below expected values, and patient dosage has  been confirmed, suggest follow up testing of antithrombin III levels. Performed at [facility], [address] [place], [place] [address]   Culture, blood (Routine X 2) w Reflex to ID Panel     Status: None (Preliminary result)   Collection Time: 09/16/23  8:55 AM   Specimen: BLOOD RIGHT HAND  Result Value Ref Range   Specimen Description      BLOOD RIGHT HAND BOTTLES DRAWN AEROBIC AND ANAEROBIC   Special Requests Blood Culture adequate volume    Culture      NO GROWTH < 24 HOURS Performed at [facility], [address] [place], [place] [address]    Report Status PENDING   Culture, blood (Routine X 2) w Reflex to ID Panel     Status: None (Preliminary result)   Collection Time: 09/16/23  8:55 AM   Specimen: BLOOD LEFT HAND  Result Value Ref Range   Specimen Description      BLOOD LEFT HAND BOTTLES DRAWN AEROBIC AND ANAEROBIC   Special Requests Blood Culture adequate volume    Culture      NO GROWTH < 24 HOURS Performed at [facility], [address] [place], [place] [address]    Report Status PENDING   Digoxin level     Status: None   Collection Time: 09/16/23  2:30 PM  Result Value Ref Range   Digoxin Level 0.8 0.8 - 2.0 ng/mL    Comment: Performed at [facility], [address], [place], [place] [address]  Renal function panel     Status: Abnormal   Collection Time: 09/17/23  3:42 AM  Result Value Ref Range   Sodium 133 (L) 135 - 145 mmol/L   Potassium 4.3 3.5 - 5.1 mmol/L   Chloride 100 98 - 111 mmol/L   CO2 20 (L) 22 - 32 mmol/L   Glucose, Bld 99 70 - 99 mg/dL    Comment: Glucose reference range applies only to samples taken after fasting for at least 8 hours.   BUN 90 (H) 8 - 23 mg/dL    Creatinine, Ser 2.95 (H) 0.61 - 1.24 mg/dL   Calcium 8.7 (L) 8.9 - 10.3 mg/dL   Phosphorus 4.2 2.5 - 4.6 mg/dL   Albumin 2.8 (L) 3.5 - 5.0 g/dL   GFR, Estimated 5 (L) >60 mL/min    Comment: (NOTE) Calculated using the CKD-EPI Creatinine Equation (2021)    Anion gap 13 5 - 15    Comment: Performed at [facility], [address], [place], [place] [address]  CBC     Status: Abnormal   Collection Time: 09/17/23  3:42 AM  Result Value Ref Range   WBC 18.4 (H) 4.0 - 10.5 K/uL   RBC 2.75 (L) 4.22 - 5.81 MIL/uL   Hemoglobin 9.1 (L) 13.0 - 17.0 g/dL   HCT 86.5 (L) 78.4 - 69.6 %   MCV 99.6 80.0 - 100.0 fL   MCH 33.1 26.0 - 34.0 pg   MCHC 33.2 30.0 - 36.0 g/dL   RDW 29.5 28.4 - 13.2 %   Platelets 214 150 - 400 K/uL   nRBC 0.0 0.0 - 0.2 %    Comment: Performed at [facility], [address] [place], [place] [address]  Heparin level (unfractionated)     Status: Abnormal   Collection Time: 09/17/23  3:42 AM  Result Value Ref Range   Heparin Unfractionated 0.22 (L) 0.30 - 0.70 IU/mL    Comment: (  NOTE) The clinical reportable range upper limit is being lowered to >1.10 to align with the FDA approved guidance for the current laboratory assay.  If heparin results are below expected values, and patient dosage has  been confirmed, suggest follow up testing of antithrombin III levels. Performed at [facility], [address], [place], [place] [address]     No results found.   Assessment & Plan:  Raymond Burton is a 82 y.o. male with need for temporary HD access. Given the heparin gtt would be best to hold that and place an upper since HD plans to start tomorrow. Discussed risk of placement with family and risk of bleeding, infection, injury to vessels, need for tunneled line later, issues with line, pneumothorax.   Plan for minor room procedure 830 Heparin gtt hold midnight No need to be NPO as doing local for procedure   All questions were answered to the satisfaction of the patient and  family.  Lucretia Roers 09/17/2023, 12:07 PM

## 2023-09-17 NOTE — Plan of Care (Signed)
  Problem: Health Behavior/Discharge Planning: Goal: Ability to manage health-related needs will improve Outcome: Progressing   Problem: Clinical Measurements: Goal: Will remain free from infection Outcome: Progressing   Problem: Nutrition: Goal: Adequate nutrition will be maintained Outcome: Progressing

## 2023-09-17 NOTE — Progress Notes (Signed)
PROGRESS NOTE   Raymond Burton  MVH:[id number] DOB: 10-15-1940 DOA: 09/06/2023 PCP: Renaye Rakers, MD   Chief Complaint  Patient presents with   Fatigue   Level of care: Telemetry  Brief Admission History:  82 y.o. male with medical history significant for bradycardia, hypertension, prediabetes, Parkinson's, TIA.  Patient was brought to the ED by family with complaints of generalized weakness. 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  Family reports gradual weakness over the past several weeks to months, over the past 3 days it has been more pronounced, with patient lying in bed.  He also reports gradual onset of poor appetite in the same time period, which is different from his baseline.  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  He is on IV heparin for stroke prevention due to likely upcoming procedures for PD start up.  Nephrology has ordered further serologies.  Continues to be followed by nephrology and currently waiting for bed at [facility] with tentative plan for urgent PD start up.    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  -UA with small hemoglobin, 30 protein -Renal ultrasound with no signs of hydronephrosis -Inserted Foley for BOO, replaced foley 12/7 -Appreciate ongoing nephrology evaluation -Held hydrochlorothiazide/telmisartan (25/80) -Appears that creatinine was 1.3 per PCP on 05/2023 -nephrology following closely for renal recovery -proteinuria being worked up by nephrology, with abnormal free light chains ratio will need hem/onc evaluation -family discussed with family member who is a nephrologist and request is to transfer patient to [facility] for urgent start PD which is not available at [facility].  I have  called [facility] and patient has been accepted by Dr. Molli Barrows to the nephrology service. No beds are available but patient has been marked for priority transfer and [facility] will notify us when a bed is available and arrange for transfer.   - I updated [facility] today 12/7 regarding current clinical condition and overnight changes - 12/8: pt having more uremia symptoms, I reached out to Dr. Ronalee Belts and discussed with family, they are agreeable to starting hemodialysis here. I reached out to Dr. Henreitta Leber and requested evaluation for temporary HD catheter placement. I also called to oncology on call to request consultation regarding possible myeloma kidney and left message.   - tentative plan is to start HD 12/9 at [facility] after dialysis catheter placement    Question of myeloma kidney - renal biopsy when more medically stable - Dr. Ronalee Belts requested an oncology consultation - I have called out to oncology on call to request consult, no answer, left voice message 09/17/23  Elevated troponin Troponin 458 > 475    - Currently chest pain-free.  EKG without ST or T wave changes. - EDP talked to cardiology, does not feel that this is very concerning with normal EKG in the setting of significant renal dysfunction, should be okay for patient to stay at [facility].  -Echocardiogram with no wall motion abnormalities and preserved LVEF -IV heparin discontinued 11/29 but will be restarted 12/6 with findings of atrial fibrillation.    UTI - replaced foley 12/7 - foley placed for bladder outlet obstruction - IV CTX x 3 doses  - follow urine culture    Leukocytosis - presumably secondary to  UTI - IV CTX ordered - BC x 2 - CXR no acute findings  Paroxysmal Atrial Fibrillation new diagnosis 09/15/23  - appreciate cardiology consultation - pt has been started on rhythm control strategy - he is on amiodarone 200 mg BID  - he is a reasonable candidate for DOAC but since pending urgent PD start up at [facility] will have him on  IV heparin for stroke prophylaxis  - pt had Afib RVR overnight and given another small dose of IV digoxin.  Would not give more digoxin given his ESRD - added digoxin to allergy list to prompt any provider to avoid giving this  - he is on IV heparin for stroke prevention due to upcoming procedures for urgent PD startup - plan to stop heparin at midnight for procedure 12/9   Parkinson disease Follows with neurology.  Baseline dementia, at baseline he can answer simple questions, but on bad days he is barely able to communicate. -Resumed home dose Sinemet   GERD - pantoprazole ordered for GI protection  Pre-diabetes - Hgba1c 6.0% - diet controlled   Bradycardia Sinus bradycardia by EKG.  Heart rate 49-63.  History of bradycardia.  Not on rate limiting medications. -TSH normal at 2.05   Essential hypertension -soft BPs -amlodipine discontinued   Worsening anemia of CKD No indication for ESA Iron panel ordered per nephrology   Constipation-resolved Started MiraLAX and Senokot  Leukocytosis - CXR no active findings - urinalysis suggesting infection - procalcitonin not helpful given it can be markedly elevated with ESRD  - blood culture x 2 no growth to date     DVT prophylaxis:  IV heparin Code Status: Full Family Communication: Wife present at bedside 12/4, 12/5, 12/6, son 12/7 Disposition Plan: Await bed at [facility] to transfer but in the meantime planning to start HD after temporary cath placement on 12/9 with Dr. Henreitta Leber.    Consultants:  Nephrology  Procedures:   Antimicrobials:    Subjective: Pt more somnolent today but he did eat breakfast.     Objective: Vitals:   09/17/23 0719 09/17/23 1120 09/17/23 1124 09/17/23 1140  BP: 103/77 (!) 82/53 (!) 91/59 (!) 90/58  Pulse: (!) 106 69 76   Resp: 16 16    Temp: 97.8 F (36.6 C)     TempSrc: Oral     SpO2: 97% 96% 95%   Weight:      Height:        Intake/Output Summary (Last 24 hours) at 09/17/2023 1429 Last  data filed at 09/17/2023 1300 Gross per 24 hour  Intake 821.22 ml  Output 1050 ml  Net -228.78 ml   Filed Weights   09/06/23 1402 09/06/23 2013  Weight: 79.4 kg 77.3 kg   Examination:  General exam: Appears calm and comfortable. Sitting up in chair.  Respiratory system: Clear to auscultation. Respiratory effort normal. Cardiovascular system: normal S1 & S2 heard. No JVD, murmurs, rubs, gallops or clicks. No pedal edema. Gastrointestinal system: Abdomen is nondistended, soft and nontender. No organomegaly or masses felt. Normal bowel sounds heard. Central nervous system: Alert and oriented. No focal neurological deficits. Extremities: Symmetric 5 x 5 power. Skin: No rashes, lesions or ulcers. Psychiatry: Judgement and insight appear diminished. Mood & affect appropriate.   Data Reviewed: I have personally reviewed following labs and imaging studies  CBC: Recent Labs  Lab 09/13/23 0407 09/15/23 0128 09/16/23 0440 09/17/23 0342  WBC 7.2 15.6* 18.8* 18.4*  HGB 10.5* 9.0* 9.4* 9.1*  HCT 30.8* 26.1* 28.8* 27.4*  MCV 96.9 97.4 100.7* 99.6  PLT 211 206 209 214    Basic Metabolic Panel: Recent Labs  Lab 09/13/23 0407 09/14/23 0344 09/15/23 0122 09/15/23 0713 09/16/23 0440 09/17/23 0342  NA 135 135 136  --  135 133*  K 3.8 4.0 4.2 4.1 4.3 4.3  CL 100 102 101  --  99 100  CO2 23 22 22   --  20* 20*  GLUCOSE 103* 96 117*  --  113* 99  BUN 76* 82* 80*  --  84* 90*  CREATININE 8.19* 7.92* 8.26*  --  8.47* 8.95*  CALCIUM 8.8* 8.7* 8.8*  --  8.9 8.7*  MG 1.8  --  1.7  --   --   --   PHOS 5.2* 4.9* 4.4  --  4.4 4.2    CBG: No results for input(s): "GLUCAP" in the last 168 hours.  Recent Results (from the past 240 hour(s))  Surgical PCR screen     Status: None   Collection Time: 09/07/23 10:45 PM   Specimen: Nasal Mucosa; Nasal Swab  Result Value Ref Range Status   MRSA, PCR NEGATIVE NEGATIVE Final   Staphylococcus aureus NEGATIVE NEGATIVE Final    Comment: (NOTE) The  Xpert SA Assay (FDA approved for NASAL specimens in patients 60 years of age and older), is one component of a comprehensive surveillance program. It is not intended to diagnose infection nor to guide or monitor treatment. Performed at [facility], [address], [place], [place] [address]   Culture, blood (Routine X 2) w Reflex to ID Panel     Status: None (Preliminary result)   Collection Time: 09/16/23  8:55 AM   Specimen: BLOOD RIGHT HAND  Result Value Ref Range Status   Specimen Description   Final    BLOOD RIGHT HAND BOTTLES DRAWN AEROBIC AND ANAEROBIC   Special Requests Blood Culture adequate volume  Final   Culture   Final    NO GROWTH < 24 HOURS Performed at [facility], [address], [place], [place] [address]    Report Status PENDING  Incomplete  Culture, blood (Routine X 2) w Reflex to ID Panel     Status: None (Preliminary result)   Collection Time: 09/16/23  8:55 AM   Specimen: BLOOD LEFT HAND  Result Value Ref Range Status   Specimen Description   Final    BLOOD LEFT HAND BOTTLES DRAWN AEROBIC AND ANAEROBIC   Special Requests Blood Culture adequate volume  Final   Culture   Final    NO GROWTH < 24 HOURS Performed at [facility], [address] [place], [place] [address]    Report Status PENDING  Incomplete     Radiology Studies: No results found.  Scheduled Meds:  amiodarone  200 mg Oral BID   aspirin EC  81 mg Oral Daily   atorvastatin  40 mg Oral Daily   Chlorhexidine Gluconate Cloth  6 each Topical Daily   Chlorhexidine Gluconate Cloth  6 each Topical Once   [START ON 09/18/2023] Chlorhexidine Gluconate Cloth  6 each Topical Q0600   pantoprazole  40 mg Oral QHS   polyethylene glycol  17 g Oral Daily   senna-docusate  1 tablet Oral BID   tamsulosin  0.4 mg Oral Daily   Continuous Infusions:  cefTRIAXone (ROCEPHIN)  IV 1 g (09/17/23 0909)   heparin 1,250 Units/hr (09/17/23 1150)    LOS: 11 days   Time spent: 57 mins  Solae Norling, MD How  to  contact the [facility] Attending or Consulting provider 7A - 7P or covering provider during after hours 7P -7A, for this patient?  Check the care team in [facility] and look for a) attending/consulting TR[facility] provider listed and b) the [facility] team listed Log into [email or web address] to find provider on call.  Locate the [facility] provider you are looking for under [facility] and page to a number that you can be directly reached. If you still have difficulty reaching the provider, please page the [facility] (Director on Call) for the Hospitalists listed on amion for assistance.  09/17/2023, 2:29 PM

## 2023-09-17 NOTE — Progress Notes (Addendum)
[facility] NEPHROLOGY PROGRESS NOTE  Assessment/ Plan: Pt is a 82 y.o. yo male  with past medical history significant for hypertension, prediabetes, HLD, Parkinson's dementia, TIA who was presented to the hospital on 11/27 because of worsening fatigue and generalized weakness, seen as a consultation for the evaluation of acute kidney injury.   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  Reportedly the last serum creatinine level was 1.3 in 05/2023 and now presented with cr around 8 associated with poor oral intake  ongoing use of ARB, diuretics and possible bladder outlet obstruction.  Also thought to be some component of ATN. Bone marrow survey was done which was negative for osteolytic lesion. Today, I was called by primary team because the patient is becoming weaker and less oriented than before with a concern of worsening uremic symptoms and to evaluate for starting dialysis.  The initial plan was to transfer him to [facility] for urgent start PD but no bed available for almost 3 days now.  The family is willing to start dialysis in the hospital after catheter placement.  We have consulted surgery team to place catheter tomorrow morning and plan to start HD.  May consider kidney biopsy after few HD treatment to correct uremia. I have discussed with the patient's wife and 2 sons at the bedside.  I  tried to call patient's niece who is a nephrologist at [facility] with no answer. Needs social worker help to arrange outpatient HD.  He can be evaluated by PD as outpatient.  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  Please consult oncology further evaluation.  I will defer to oncologist for further management of myeloma.  # Hypertension: Monitor BP.  BP variable.  Currently not on antihypertensives.  # A-fib with RVR: Receiving heparin which needs to be hold before the catheter  placement.  # Anemia: Monitor hemoglobin, no ESA given paraproteinemia.  Discussed with the multiple family members, primary team, nursing staff and surgery team.  Subjective: Seen and examined at the bedside.  Patient is sitting on chair comfortable.  He is sluggish and confused than before.  His wife and 2 sons presented with him at the bedside.  Nurses also presented. Objective Vital signs in last 24 hours: Vitals:   09/17/23 0719 09/17/23 1120 09/17/23 1124 09/17/23 1140  BP: 103/77 (!) 82/53 (!) 91/59 (!) 90/58  Pulse: (!) 106 69 76   Resp: 16 16    Temp: 97.8 F (36.6 C)     TempSrc: Oral     SpO2: 97% 96% 95%   Weight:      Height:       Weight change:   Intake/Output Summary (Last 24 hours) at 09/17/2023 1231 Last data filed at 09/17/2023 1100 Gross per 24 hour  Intake 581.22 ml  Output 1050 ml  Net -468.78 ml       Labs: RENAL PANEL Recent Labs  Lab 09/13/23 0407 09/14/23 0344 09/15/23 0122 09/15/23 0713 09/16/23 0440 09/17/23 0342  NA 135 135 136  --  135 133*  K 3.8 4.0 4.2 4.1 4.3 4.3  CL 100 102 101  --  99 100  CO2 23 22 22   --  20* 20*  GLUCOSE 103* 96 117*  --  113* 99  BUN 76* 82* 80*  --  84* 90*  CREATININE 8.19* 7.92* 8.26*  --  8.47* 8.95*  CALCIUM 8.8* 8.7* 8.8*  --  8.9 8.7*  MG 1.8  --  1.7  --   --   --   PHOS 5.2* 4.9* 4.4  --  4.4 4.2  ALBUMIN 3.0* 2.8* 2.8*  --  2.8* 2.8*    Liver Function Tests: Recent Labs  Lab 09/15/23 0122 09/16/23 0440 09/17/23 0342  ALBUMIN 2.8* 2.8* 2.8*   No results for input(s): "LIPASE", "AMYLASE" in the last 168 hours. No results for input(s): "AMMONIA" in the last 168 hours. CBC: Recent Labs    09/08/23 1001 09/09/23 0416 09/13/23 0407 09/15/23 0128 09/16/23 0440 09/17/23 0342  HGB  --  10.0* 10.5* 9.0* 9.4* 9.1*  MCV  --  98.7 96.9 97.4 100.7* 99.6  FERRITIN 398*  --   --   --   --   --   TIBC 150*  --   --   --   --   --   IRON 80  --   --   --   --   --     Cardiac Enzymes: No  results for input(s): "CKTOTAL", "CKMB", "CKMBINDEX", "TROPONINI" in the last 168 hours. CBG: No results for input(s): "GLUCAP" in the last 168 hours.  Iron Studies: No results for input(s): "IRON", "TIBC", "TRANSFERRIN", "FERRITIN" in the last 72 hours. Studies/Results: No results found.  Medications: Infusions:  cefTRIAXone (ROCEPHIN)  IV 1 g (09/17/23 0909)   heparin 1,250 Units/hr (09/17/23 1150)    Scheduled Medications:  amiodarone  200 mg Oral BID   aspirin EC  81 mg Oral Daily   atorvastatin  40 mg Oral Daily   Chlorhexidine Gluconate Cloth  6 each Topical Daily   Chlorhexidine Gluconate Cloth  6 each Topical Once   And   Chlorhexidine Gluconate Cloth  6 each Topical Once   pantoprazole  40 mg Oral QHS   polyethylene glycol  17 g Oral Daily   senna-docusate  1 tablet Oral BID   tamsulosin  0.4 mg Oral Daily    have reviewed scheduled and prn medications.  Physical Exam: General:NAD, comfortable Heart:RRR, s1s2 nl Lungs:clear b/l, no crackle Abdomen:soft, Non-tender, non-distended Extremities:No edema Neurology: Alert, awake but confused  Raymond Burton [name] 09/17/2023,12:31 PM  LOS: 11 days

## 2023-09-17 NOTE — Plan of Care (Signed)
  Problem: Health Behavior/Discharge Planning: Goal: Ability to manage health-related needs will improve Outcome: Progressing   Problem: Clinical Measurements: Goal: Will remain free from infection Outcome: Progressing Goal: Diagnostic test results will improve Outcome: Progressing Goal: Cardiovascular complication will be avoided Outcome: Progressing   Problem: Nutrition: Goal: Adequate nutrition will be maintained Outcome: Progressing   Problem: Clinical Measurements: Goal: Ability to maintain clinical measurements within normal limits will improve Outcome: Not Progressing

## 2023-09-18 ENCOUNTER — Inpatient Hospital Stay (HOSPITAL_COMMUNITY): Payer: Medicare PPO

## 2023-09-18 ENCOUNTER — Other Ambulatory Visit: Payer: Self-pay | Admitting: *Deleted

## 2023-09-18 ENCOUNTER — Encounter (HOSPITAL_COMMUNITY)
Admission: EM | Disposition: A | Discharge status: Other Acute Inpatient Hospital | Payer: Self-pay | Source: Home / Self Care | Attending: Family Medicine

## 2023-09-18 ENCOUNTER — Telehealth: Payer: Self-pay | Admitting: *Deleted

## 2023-09-18 DIAGNOSIS — I10 Essential (primary) hypertension: Secondary | ICD-10-CM | POA: Diagnosis not present

## 2023-09-18 DIAGNOSIS — I517 Cardiomegaly: Secondary | ICD-10-CM | POA: Diagnosis not present

## 2023-09-18 DIAGNOSIS — R001 Bradycardia, unspecified: Secondary | ICD-10-CM | POA: Diagnosis not present

## 2023-09-18 DIAGNOSIS — D649 Anemia, unspecified: Secondary | ICD-10-CM

## 2023-09-18 DIAGNOSIS — N179 Acute kidney failure, unspecified: Secondary | ICD-10-CM | POA: Diagnosis not present

## 2023-09-18 DIAGNOSIS — I480 Paroxysmal atrial fibrillation: Secondary | ICD-10-CM | POA: Diagnosis not present

## 2023-09-18 LAB — CBC
HCT: 25.4 % — ABNORMAL LOW (ref 39.0–52.0)
Hemoglobin: 8.6 g/dL — ABNORMAL LOW (ref 13.0–17.0)
MCH: 33.5 pg (ref 26.0–34.0)
MCHC: 33.9 g/dL (ref 30.0–36.0)
MCV: 98.8 fL (ref 80.0–100.0)
Platelets: 228 10*3/uL (ref 150–400)
RBC: 2.57 MIL/uL — ABNORMAL LOW (ref 4.22–5.81)
RDW: 13.1 % (ref 11.5–15.5)
WBC: 14.3 10*3/uL — ABNORMAL HIGH (ref 4.0–10.5)
nRBC: 0 % (ref 0.0–0.2)

## 2023-09-18 LAB — RENAL FUNCTION PANEL
Albumin: 2.6 g/dL — ABNORMAL LOW (ref 3.5–5.0)
Anion gap: 13 (ref 5–15)
BUN: 94 mg/dL — ABNORMAL HIGH (ref 8–23)
CO2: 19 mmol/L — ABNORMAL LOW (ref 22–32)
Calcium: 8.7 mg/dL — ABNORMAL LOW (ref 8.9–10.3)
Chloride: 101 mmol/L (ref 98–111)
Creatinine, Ser: 8.93 mg/dL — ABNORMAL HIGH (ref 0.61–1.24)
GFR, Estimated: 5 mL/min — ABNORMAL LOW (ref >60)
Glucose, Bld: 96 mg/dL (ref 70–99)
Phosphorus: 4.2 mg/dL (ref 2.5–4.6)
Potassium: 4 mmol/L (ref 3.5–5.1)
Sodium: 133 mmol/L — ABNORMAL LOW (ref 135–145)

## 2023-09-18 SURGERY — MINOR INSERTION OF DIALYSIS CATHETER
Anesthesia: LOCAL

## 2023-09-18 MED ORDER — ALTEPLASE 2 MG IJ SOLR: 2.0000 mg | Freq: Once | INTRAMUSCULAR | Status: DC | PRN

## 2023-09-18 MED ORDER — HEPARIN SODIUM (PORCINE) 1000 UNIT/ML DIALYSIS
1000.0000 [IU] | INTRAMUSCULAR | Status: DC | PRN
  Administered 2023-09-18: 2600 [IU]
  Administered 2023-09-19: 1000 [IU]

## 2023-09-18 MED ORDER — LEVALBUTEROL HCL 0.63 MG/3ML IN NEBU
0.6300 mg | INHALATION_SOLUTION | Freq: Four times a day (QID) | RESPIRATORY_TRACT | Status: DC
  Administered 2023-09-18 – 2023-09-20 (×8): 0.63 mg via RESPIRATORY_TRACT
  Filled 2023-09-18 (×8): qty 3

## 2023-09-18 MED ORDER — AMOXICILLIN 250 MG PO CAPS
500.0000 mg | ORAL_CAPSULE | Freq: Two times a day (BID) | ORAL | Status: DC
  Administered 2023-09-18 – 2023-09-19 (×3): 500 mg via ORAL
  Filled 2023-09-18 (×3): qty 2

## 2023-09-18 MED ORDER — HEPARIN SODIUM (PORCINE) 1000 UNIT/ML IJ SOLN
INTRAMUSCULAR | Status: AC
Start: 2023-09-18 — End: ?
  Filled 2023-09-18: qty 4

## 2023-09-18 MED ORDER — LIDOCAINE HCL (PF) 1 % IJ SOLN
INTRAMUSCULAR | Status: AC
  Filled 2023-09-18: qty 30

## 2023-09-18 MED ORDER — HEPARIN SODIUM (PORCINE) 1000 UNIT/ML IJ SOLN
INTRAMUSCULAR | Status: AC
  Filled 2023-09-18: qty 4

## 2023-09-18 MED ORDER — LIDOCAINE HCL 1 % IJ SOLN
INTRAMUSCULAR | Status: DC | PRN
  Administered 2023-09-18: 10 mL
  Administered 2023-09-18: 4 mL

## 2023-09-18 MED ORDER — HEPARIN (PORCINE) 25000 UT/250ML-% IV SOLN
1600.0000 [IU]/h | INTRAVENOUS | Status: DC
  Administered 2023-09-18 – 2023-09-19 (×2): 1250 [IU]/h via INTRAVENOUS
  Filled 2023-09-18 (×2): qty 250

## 2023-09-18 NOTE — Plan of Care (Signed)
  Problem: Health Behavior/Discharge Planning: Goal: Ability to manage health-related needs Gaylin Osoria improve Outcome: Progressing   Problem: Nutrition: Goal: Adequate nutrition Karri Kallenbach be maintained Outcome: Progressing   

## 2023-09-18 NOTE — Progress Notes (Signed)
PHARMACY - ANTICOAGULATION CONSULT NOTE  Pharmacy Consult for Heparin Indication: atrial fibrillation  Allergies  Allergen Reactions   Digoxin And Related Other (See Comments)    Patient Measurements: Height: 5\' 9"  (175.3 cm) Weight: 77.3 kg (170 lb 6.7 oz) IBW/kg (Calculated) : 70.7 HEPARIN DW (KG): 79.4   Vital Signs: Temp: 99.6 F (37.6 C) (12/09 0812) Temp Source: Oral (12/09 0812) BP: 109/64 (12/09 0812) Pulse Rate: 87 (12/09 0812)  Labs: Recent Labs    09/16/23 0440 09/17/23 0342 09/17/23 1854 09/18/23 0405  HGB 9.4* 9.1*  --  8.6*  HCT 28.8* 27.4*  --  25.4*  PLT 209 214  --  228  HEPARINUNFRC 0.33 0.22* 0.40  --   CREATININE 8.47* 8.95*  --  8.93*    Estimated Creatinine Clearance: 6.5 mL/min (A) (by C-G formula based on SCr of 8.93 mg/dL (H)).   Medical History: Past Medical History:  Diagnosis Date   Bradycardia    Diabetes mellitus without complication (HCC)    Hyperlipidemia    Hypertension    NUCLEAR STRESS TEST, 07/17/2007 - EKG negative ischemia   Memory loss    Murmur    2D ECHO, 02/04/2009 - EF 55-65%, normal   TIA (transient ischemic attack)     Medications:  Medications Prior to Admission  Medication Sig Dispense Refill Last Dose   amLODipine (NORVASC) 10 MG tablet Take 10 mg by mouth daily.   09/06/2023   carbidopa-levodopa (SINEMET IR) 25-100 MG tablet Take 1 tablet by mouth 3 (three) times daily.   09/06/2023   GARLIC PO Take 1 tablet by mouth daily.   09/06/2023   Multiple Vitamin (MULTIVITAMIN) tablet Take 1 tablet by mouth daily.   09/06/2023   NAMZARIC 28-10 MG CP24 Take 1 capsule by mouth at bedtime.   09/05/2023   pravastatin (PRAVACHOL) 40 MG tablet TAKE 1 TABLET BY MOUTH ONCE DAILY. 90 tablet 4 09/06/2023   sertraline (ZOLOFT) 25 MG tablet Take 1 tablet by mouth daily.   09/06/2023   tamsulosin (FLOMAX) 0.4 MG CAPS capsule Take 0.4 mg by mouth daily.   09/05/2023   telmisartan-hydrochlorothiazide (MICARDIS HCT) 80-25 MG per  tablet Take 1 tablet by mouth daily.   09/05/2023    Assessment: Raymond Burton is an 82 yo male who presented to [facility] ED on 09/06/2023 for evaluation of fatigue and weakness. Was found to have an AKI with creatinine elevated at 7.82 . H/o HTN, HLD, Parkinsons, , prior TIA and bradycardia. Patient with new onset afib. Pharmacy asked to dose heparin. Patient to be evaluated by [facility] for urgent start PD which is n/a at [facility]. Not on any oral anticoagulants PTA.  Heparin was turned off at midnight last night for HD catheter to be placed this morning. Heparin orders entered to restart this afternoon. Will recheck level in am.   Goal of Therapy:  Heparin level 0.3-0.7 units/ml Monitor platelets by anticoagulation protocol: Yes   Plan:  Restart heparin infusion at 1250 units/hr Monitor daily heparin levels while on heparin infusion Continue to monitor H&H and platelets  Thank you for involving pharmacy in this patient's care.   Sheppard Coil PharmD., BCPS Clinical Pharmacist 09/18/2023 9:27 AM

## 2023-09-18 NOTE — Progress Notes (Signed)
[facility]  Updated family and team. CXR with catheter in good position no ptx.   Ok to use the catheter.  Can restart heparin at 1330.   Algis Greenhouse, MD [facility] [address] [place] [place], [place] [address] [phone] (office)

## 2023-09-18 NOTE — Progress Notes (Signed)
[facility] NEPHROLOGY PROGRESS NOTE  Assessment/ Plan: Pt is a 82 y.o. yo male  with past medical history significant for hypertension, prediabetes, HLD, Parkinson's dementia, TIA who was presented to the hospital on 11/27 because of worsening fatigue and generalized weakness, seen as a consultation for the evaluation of acute kidney injury.   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  Reportedly the last serum creatinine level was 1.3 in 05/2023 and now presented with cr around 8 associated with poor oral intake  ongoing use of ARB, diuretics and possible bladder outlet obstruction.  Also thought to be some component of ATN. Bone marrow survey was done which was negative for osteolytic lesion. 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 IF pending. -pending transfer to [facility] for consideration of urgent start PD. Been pending for a few days now. -concern for developing uremic symptoms. Temp HD catheter to be placed 12/9-appreciate surgery's assistance. HD#1 planned for today. HD#2 planned for tomorrow. Slow start. 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   -Onc contacted by primary service, pending consult, will defer to oncologist for further management of myeloma. Renal biopsy planned for Wed 12/11  # Hypertension: Monitor BP.  BP variable.  Currently not on antihypertensives.  # A-fib with RVR: Receiving heparin which needs to be on hold before the catheter placement.  # Anemia: Monitor hemoglobin, no ESA given paraproteinemia.  Discussed with patient's son and wife at the bedside this AM.  Subjective: Seen and examined at the bedside.  No acute events overnight.  Uop charted ~1.3L  Objective Vital signs in last 24 hours: Vitals:   09/17/23 2002 09/18/23 0109 09/18/23 0453 09/18/23 0500  BP: (!) 100/59 94/68 (!) 88/53 110/62  Pulse: (!) 104 86 84   Resp: 20 18 18    Temp:  98.2 F (36.8 C)    TempSrc:      SpO2: 97% 96% 93%   Weight:      Height:       Weight change:   Intake/Output Summary (Last 24 hours) at 09/18/2023 0656 Last data filed at 09/18/2023 0559 Gross per 24 hour  Intake 240 ml  Output 1350 ml  Net -1110 ml    Labs: RENAL PANEL Recent Labs  Lab 09/13/23 0407 09/14/23 0344 09/15/23 0122 09/15/23 0713 09/16/23 0440 09/17/23 0342 09/18/23 0405  NA 135 135 136  --  135 133* 133*  K 3.8 4.0 4.2 4.1 4.3 4.3 4.0  CL 100 102 101  --  99 100 101  CO2 23 22 22   --  20* 20* 19*  GLUCOSE 103* 96 117*  --  113* 99 96  BUN 76* 82* 80*  --  84* 90* 94*  CREATININE 8.19* 7.92* 8.26*  --  8.47* 8.95* 8.93*  CALCIUM 8.8* 8.7* 8.8*  --  8.9 8.7* 8.7*  MG 1.8  --  1.7  --   --   --   --   PHOS 5.2* 4.9* 4.4  --  4.4 4.2 4.2  ALBUMIN 3.0* 2.8* 2.8*  --  2.8* 2.8* 2.6*    Liver Function Tests: Recent Labs  Lab 09/16/23 0440 09/17/23 0342 09/18/23 0405  ALBUMIN 2.8*  2.8* 2.6*   No results for input(s): "LIPASE", "AMYLASE" in the last 168 hours. No results for input(s): "AMMONIA" in the last 168 hours. CBC: Recent Labs    09/08/23 1001 09/09/23 0416 09/13/23 0407 09/15/23 0128 09/16/23 0440 09/17/23 0342 09/18/23 0405  HGB  --    < > 10.5* 9.0* 9.4* 9.1* 8.6*  MCV  --    < > 96.9 97.4 100.7* 99.6 98.8  FERRITIN 398*  --   --   --   --   --   --   TIBC 150*  --   --   --   --   --   --   IRON 80  --   --   --   --   --   --    < > = values in this interval not displayed.    Cardiac Enzymes: No results for input(s): "CKTOTAL", "CKMB", "CKMBINDEX", "TROPONINI" in the last 168 hours. CBG: No results for input(s): "GLUCAP" in the last 168 hours.  Iron Studies: No results for input(s): "IRON", "TIBC",  "TRANSFERRIN", "FERRITIN" in the last 72 hours. Studies/Results: No results found.  Medications: Infusions:  cefTRIAXone (ROCEPHIN)  IV 1 g (09/17/23 0909)    Scheduled Medications:  amiodarone  200 mg Oral BID   aspirin EC  81 mg Oral Daily   atorvastatin  40 mg Oral Daily   Chlorhexidine Gluconate Cloth  6 each Topical Daily   Chlorhexidine Gluconate Cloth  6 each Topical Q0600   pantoprazole  40 mg Oral QHS   polyethylene glycol  17 g Oral Daily   senna-docusate  1 tablet Oral BID   tamsulosin  0.4 mg Oral Daily    have reviewed scheduled and prn medications.  Physical Exam: General:NAD Heart:RRR, s1s2 nl Lungs:clear b/l, no w/r/c Abdomen: non-distended Extremities:No edema Neurology: Alert, awake but confused  Raymond Burton 09/18/2023,6:56 AM  LOS: 12 days

## 2023-09-18 NOTE — Progress Notes (Signed)
PROGRESS NOTE   Raymond Burton  WUJ:[id number] DOB: [birth year] DOA: 09/06/2023 PCP: Renaye Rakers, MD   Chief Complaint  Patient presents with   Fatigue   Level of care: Telemetry  Brief Admission History:  82 y.o. male with medical history significant for bradycardia, hypertension, prediabetes, Parkinson's, TIA.  Patient was brought to the ED by family with complaints of generalized weakness. 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  Family reports gradual weakness over the past several weeks to months, over the past 3 days it has been more pronounced, with patient lying in bed.  He also reports gradual onset of poor appetite in the same time period, which is different from his baseline.  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  He is on IV heparin for stroke prevention due to likely upcoming procedures for PD start up.  Nephrology has ordered further serologies.  Continues to be followed by nephrology and currently waiting for bed at [facility] with tentative plan for urgent PD start up.    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  -UA with small hemoglobin, 30 protein -Renal ultrasound with no signs of hydronephrosis -Inserted Foley for BOO, replaced foley 12/7 -Appreciate ongoing nephrology evaluation -Held hydrochlorothiazide/telmisartan (25/80) -Appears that creatinine was 1.3 per PCP on 05/2023 -nephrology following closely for renal recovery -proteinuria being worked up by nephrology, with abnormal free light chains ratio will need hem/onc evaluation -family discussed with family member who is a nephrologist and request is to transfer patient to [facility] for urgent start PD which is not available at [facility].  I have  called [facility] and patient has been accepted by Dr. Molli Barrows to the nephrology service. No beds are available but patient has been marked for priority transfer and [facility] will notify us when a bed is available and arrange for transfer.   - I updated [facility] today 12/7 regarding current clinical condition and overnight changes - 12/8: pt having more uremia symptoms, I reached out to Dr. Ronalee Belts and discussed with family, they are agreeable to starting hemodialysis here. I reached out to Dr. Henreitta Leber and requested evaluation for temporary HD catheter placement. I also called to oncology on call to request consultation regarding possible myeloma kidney and left message.   - tentative plan is to start HD 12/9 at [facility] after dialysis catheter placement which was completed today - nephrology planning 2nd HD treatment tomorrow 12/10.    Question of myeloma kidney - renal biopsy when more medically stable - Dr. Ronalee Belts requested an oncology consultation - I have called out to oncology on call to request consult, no answer, left voice message 09/17/23 - Dr. Ellin Saba recommending bone marrow biopsy which is planned to be done when he has a renal biopsy with IR i  Elevated troponin Troponin 458 > 475    - Currently chest pain-free.  EKG without ST or T wave changes. - EDP talked to cardiology, does not feel that this is very concerning with normal EKG in the setting of significant renal dysfunction, should be okay for patient to stay at [facility].  -Echocardiogram with no wall motion abnormalities and preserved LVEF -IV heparin discontinued 11/29 but will be restarted 12/6 with findings of atrial fibrillation.  Enterococcus UTI - replaced foley 12/7 - foley placed for bladder outlet obstruction - discussed with pharm D and started on ampicillin  - follow urine culture and sensitivity - consider removal of foley tomorrow and do voiding trial   Leukocytosis - secondary to enterococcus UTI - antibiotics  as ordered  - BC x 2 no growth to date - CXR no active findings - urinalysis suggesting infection - procalcitonin not helpful given it can be markedly elevated with ESRD  - blood culture x 2 no growth to date  Paroxysmal Atrial Fibrillation new diagnosis 09/15/23  - appreciate cardiology consultation - pt has been started on rhythm control strategy - he is on amiodarone 200 mg BID  - he is a reasonable candidate for DOAC but since pending urgent PD start up at [facility] will have him on IV heparin for stroke prophylaxis  - pt had Afib RVR overnight and given another small dose of IV digoxin.  Would not give more digoxin given his ESRD - added digoxin to allergy list to prompt any provider to avoid giving this  - he is on IV heparin for stroke prevention due to upcoming procedures for urgent PD startup - plan to discharge home on apixaban 2.5 mg BID per cardiology recommendation - plan for amiodarone per cardiology is 200 mg BID x 3 weeks total followed by 200 mg daily    Parkinson disease Follows with neurology.  Baseline dementia, at baseline he can answer simple questions, but on bad days he is barely able to communicate. -Resumed home dose Sinemet   GERD - pantoprazole ordered for GI protection  Pre-diabetes - Hgba1c 6.0% - diet controlled   Bradycardia Sinus bradycardia by EKG.  Heart rate 49-63.  History of bradycardia.  Not on rate limiting medications. -TSH normal at 2.05   Essential hypertension -soft BPs -amlodipine discontinued   Worsening anemia of CKD No indication for ESA Iron panel ordered per nephrology   Constipation-resolved Started MiraLAX and Senokot     DVT prophylaxis:  IV heparin Code Status: Full Family Communication: Wife present at bedside 12/4, 12/5, 12/6, son 12/7, 12/8, 12/9 Disposition Plan: Await bed at [facility] to transfer but in the meantime nephrology team started HD at [facility]    Consultants:  Nephrology  Procedures:   Antimicrobials:     Subjective: Pt tolerated his first HD treatment today; he remains somnolent, not eating much;      Objective: Vitals:   09/18/23 1404 09/18/23 1447 09/18/23 1448 09/18/23 1507  BP: 116/70 105/73    Pulse: 88     Resp: 20     Temp: 98.2 F (36.8 C)     TempSrc: Oral     SpO2: 96% (!) 88% 93% 91%  Weight: 84.8 kg     Height:        Intake/Output Summary (Last 24 hours) at 09/18/2023 1724 Last data filed at 09/18/2023 1404 Gross per 24 hour  Intake 240 ml  Output 500 ml  Net -260 ml   Filed Weights   09/06/23 2013 09/18/23 1132 09/18/23 1404  Weight: 77.3 kg 84.6 kg 84.8 kg   Examination:  General exam: Appears calm and comfortable. Sitting up in chair.  Respiratory system: Clear to auscultation. Respiratory effort normal. Cardiovascular system: normal S1 & S2 heard. No JVD, murmurs, rubs, gallops or clicks. No pedal edema. Gastrointestinal system: Abdomen is nondistended, soft and nontender. No organomegaly or masses felt. Normal bowel sounds heard. Central nervous system: Alert and oriented. No focal  neurological deficits. Extremities: Symmetric 5 x 5 power. Skin: No rashes, lesions or ulcers. Psychiatry: Judgement and insight appear diminished. Mood & affect appropriate.   Data Reviewed: I have personally reviewed following labs and imaging studies  CBC: Recent Labs  Lab 09/13/23 0407 09/15/23 0128 09/16/23 0440 09/17/23 0342 09/18/23 0405  WBC 7.2 15.6* 18.8* 18.4* 14.3*  HGB 10.5* 9.0* 9.4* 9.1* 8.6*  HCT 30.8* 26.1* 28.8* 27.4* 25.4*  MCV 96.9 97.4 100.7* 99.6 98.8  PLT 211 206 209 214 228    Basic Metabolic Panel: Recent Labs  Lab 09/13/23 0407 09/14/23 0344 09/15/23 0122 09/15/23 0713 09/16/23 0440 09/17/23 0342 09/18/23 0405  NA 135 135 136  --  135 133* 133*  K 3.8 4.0 4.2 4.1 4.3 4.3 4.0  CL 100 102 101  --  99 100 101  CO2 23 22 22   --  20* 20* 19*  GLUCOSE 103* 96 117*  --  113* 99 96  BUN 76* 82* 80*  --  84* 90* 94*  CREATININE 8.19*  7.92* 8.26*  --  8.47* 8.95* 8.93*  CALCIUM 8.8* 8.7* 8.8*  --  8.9 8.7* 8.7*  MG 1.8  --  1.7  --   --   --   --   PHOS 5.2* 4.9* 4.4  --  4.4 4.2 4.2    CBG: No results for input(s): "GLUCAP" in the last 168 hours.  Recent Results (from the past 240 hour(s))  Remove and replace urinary cath (placed > 5 days) then obtain urine culture from new indwelling urinary catheter.     Status: Abnormal (Preliminary result)   Collection Time: 09/16/23  8:08 AM   Specimen: Urine, Catheterized  Result Value Ref Range Status   Specimen Description   Final    URINE, CATHETERIZED Performed at [facility], [address] [place], [place] [address]    Special Requests   Final    Immunocompromised Performed at [facility], [address], [place], [place] [address]    Culture 20,000 COLONIES/mL ENTEROCOCCUS FAECALIS (A)  Final   Report Status PENDING  Incomplete  Culture, blood (Routine X 2) w Reflex to ID Panel     Status: None (Preliminary result)   Collection Time: 09/16/23  8:55 AM   Specimen: BLOOD RIGHT HAND  Result Value Ref Range Status   Specimen Description   Final    BLOOD RIGHT HAND BOTTLES DRAWN AEROBIC AND ANAEROBIC   Special Requests Blood Culture adequate volume  Final   Culture   Final    NO GROWTH 2 DAYS Performed at [facility], [address] [place], [place] [address]    Report Status PENDING  Incomplete  Culture, blood (Routine X 2) w Reflex to ID Panel     Status: None (Preliminary result)   Collection Time: 09/16/23  8:55 AM   Specimen: BLOOD LEFT HAND  Result Value Ref Range Status   Specimen Description   Final    BLOOD LEFT HAND BOTTLES DRAWN AEROBIC AND ANAEROBIC   Special Requests Blood Culture adequate volume  Final   Culture   Final    NO GROWTH 2 DAYS Performed at [facility], [address], [place], [place] [address]    Report Status PENDING  Incomplete     Radiology Studies: DG Chest Portable 1 View  Result Date: 09/18/2023 CLINICAL DATA:   Central line placement. EXAM: PORTABLE CHEST 1 VIEW COMPARISON:  September 15, 2023. FINDINGS: Stable cardiomediastinal silhouette. Interval placement of right internal  jugular catheter with distal tip in expected position of SVC. No pneumothorax is noted. Increased right perihilar opacity is noted concerning for possible infiltrate. Minimal right basilar atelectasis and small pleural effusion is noted. IMPRESSION: Interval placement of right internal jugular catheter with distal tip in expected position of the SVC. Increased right perihilar and basilar opacities as noted above. Electronically Signed   By: Lupita Raider M.D.   On: 09/18/2023 09:45    Scheduled Meds:  amiodarone  200 mg Oral BID   amoxicillin  500 mg Oral Q12H   aspirin EC  81 mg Oral Daily   atorvastatin  40 mg Oral Daily   Chlorhexidine Gluconate Cloth  6 each Topical Daily   Chlorhexidine Gluconate Cloth  6 each Topical Q0600   levalbuterol  0.63 mg Nebulization Q6H   pantoprazole  40 mg Oral QHS   polyethylene glycol  17 g Oral Daily   senna-docusate  1 tablet Oral BID   tamsulosin  0.4 mg Oral Daily   Continuous Infusions:  heparin 1,250 Units/hr (09/18/23 1511)    LOS: 12 days   Time spent: 50 mins  Prisilla Kocsis Laural Benes, MD How to contact the [facility] Attending or Consulting provider 7A - 7P or covering provider during after hours 7P -7A, for this patient?  Check the care team in [facility] and look for a) attending/consulting TRH provider listed and b) the [facility] team listed Log into [email or web address] to find provider on call.  Locate the [facility] provider you are looking for under [facility] and page to a number that you can be directly reached. If you still have difficulty reaching the provider, please page the [facility] (Director on Call) for the Hospitalists listed on amion for assistance.  09/18/2023, 5:24 PM

## 2023-09-18 NOTE — Telephone Encounter (Signed)
Printed FMLA form e-mailed to [email or web address].  Checked for provider name and location.  Noted Raymond Burton is not an Oncology patient, spouse is a followed at [facility].  No new referrals.  Connected with caregiver, son Raymond Burton [phone]) listed on [facility] intake form for clarification of [employer] FMLA request claim no,:# [id number] is for a son of patient currently hospitalized at [facility] room A301-01.   "Her has a team of doctors here at [facility] caring for him.  First day of dialysis and new cardiac events started the last couple of days.  His PCP won't know anything about this.  He is for a biopsy tomorrow.  Using sick time today and have no further time to use for what he needs."   Noted he is followed at the [facility]  Spoke with son who will contact this office to complete form.  Has not been seen or referred to Oncology.  Son expressed being unsure who should complete forms.   Advised hospital protocol is to notify patient's PCP of admission and provide daily updates.  Darian asked if Berneice Gandy RN [phone]) has the form or if this nurse could fax form to [facility].  Denies further questions or needs.  Message left at the [facility] with above information.  Copy faxed to [facility] [phone]).  No further instructions received or actions performed by this nurse.

## 2023-09-18 NOTE — Procedures (Addendum)
Procedure Note  09/18/23   Preoperative Diagnosis: Acute renal failure    Postoperative Diagnosis: Same   Procedure(s) Performed: Trialysis (Dialysis) Line placement, Right internal jugular    Surgeon: Leatrice Jewels. Henreitta Leber, MD   Assistants: None   Anesthesia: 1% lidocaine   Complications: None    Indications: Mr. Cipolla is a 82 y.o. with acute renal failure who needs a dialysis catheter. He had his heparin gtt held at midnight. I discussed the risk and benefits of placement of the dialysis line placement with his family and him, including but not limited to bleeding, infection, and risk of pneumothorax. They have given consent for the procedure.    Procedure: The patient placed supine. The right chest and neck was prepped and draped in the usual sterile fashion.  Wearing full gown and gloves, I performed the procedure.  One percent lidocaine was used for local anesthesia. An ultrasound was utilized to assess the jugular vein.  The needle with syringe was advanced into the vein with dark venous return, and a wire was placed using the Seldinger technique without difficulty.  Ectopia was not noted.  The skin was knicked and a dilator was placed, and the three lumen catheter was placed over the wire with continued control of the wire.  There was good draw back of blood from all three lumens of the trialysis catheter and each flushed easily with saline.  I packed the dialysis lumens with heparin. The catheter was secured in 3 points with 3-0 Nylon and a biopatch and dressing was placed.     The patient tolerated the procedure well, and the CXR was ordered to confirm position of the dialysis line.   Algis Greenhouse, MD [facility] [address] [place], [place] [address] [phone] (office)

## 2023-09-18 NOTE — Plan of Care (Signed)
  Problem: Health Behavior/Discharge Planning: Goal: Ability to manage health-related needs will improve Outcome: Progressing   

## 2023-09-18 NOTE — Progress Notes (Signed)
   Patient Name: Raymond Burton Date of Encounter: 09/18/2023 [facility] Cardiologist: None    Interval Summary  .    Just got back from catheter placement for HD-groggy,no complaints  Vital Signs .    Vitals:   09/18/23 0500 09/18/23 0812 09/18/23 0815 09/18/23 0930  BP: 110/62 109/64  104/62  Pulse:  87  79  Resp:    (!) 22  Temp:  99.6 F (37.6 C)    TempSrc:  Oral    SpO2:  (!) 88% 92% 94%  Weight:      Height:        Intake/Output Summary (Last 24 hours) at 09/18/2023 1007 Last data filed at 09/18/2023 0559 Gross per 24 hour  Intake 120 ml  Output 1350 ml  Net -1230 ml      09/06/2023    8:13 PM 09/06/2023    2:02 PM 07/03/2017    9:35 AM  Last 3 Weights  Weight (lbs) 170 lb 6.7 oz 175 lb 181 lb 6.4 oz  Weight (kg) 77.3 kg 79.379 kg 82.283 kg      Telemetry/ECG    NSR - Personally Reviewed  Physical Exam .   GEN: No acute distress.   Neck: No JVD Cardiac:  RRR, no murmurs, rubs, or gallops.  Respiratory: Clear to auscultation bilaterally. GI: Soft, nontender, non-distended  MS: No edema  Assessment & Plan .     Paroxysmal Atrial Fibrillation - This is a new diagnosis for the patient and unclear if he was having episodes prior to admission as he has overall been asymptomatic with these. He did have severe left atrial dilatation by echocardiogram this admission. His arrhythmia could also be triggered by his acute illness. - Would avoid using Digoxin going forward given his renal function. He would also not be an ideal candidate for AV nodal blocking agents given that his heart rate is in the 50's when in normal sinus rhythm.plan for Amiodarone 200 mg twice daily for 1 week and then can reduce to 200 mg daily. Can consider an outpatient monitor to assess for recurrence once recovered from his acute illness. - Ideally, he would be on anticoagulation  but need to clarify timing with nephrology. Has been in IV heparin prior to HD catheter placement  today.    HTN - He has actually been hypotensive -Amlodipine stopped.    HLD - He has been continued on Atorvastatin 40 mg daily.    AKI - Creatinine was elevated at 7.82 on admission and remains elevated at 8.93 today. He is being followed closely by Nephrology. S/P HD catheter placement today.    Parkinson's disease - On Carbidopa-Levodopa prior to admission.   For questions or updates, please contact [facility] Please consult [email or web address] for contact info under        Signed, Jacolyn Reedy, PA-C

## 2023-09-18 NOTE — Progress Notes (Signed)
  INITIAL HEMODIALYSIS TREATMENT NOTE (HD#1):  Pre-HD: drowsy, oriented x3 106/61, HR81, R17, sp95% on 3L O2.  "I hope I can get through this."  Treatment was initiated with zero-goal.  Soft blood pressures throughout session.  When BP was 99/64 pt became restless, attempted to sit up, and repeated he needed to "get ready to get out of here."  UF was stopped and a NS bolus was given.   He was then calmer.  2 hour session completed.  All blood was returned. Net UF +300 ml.   Post-HD:  09/18/23 1404  Vitals  Temp 98.2 F (36.8 C)  Temp Source Oral  BP 116/70  MAP (mmHg) 83  BP Location Right Arm  BP Method Automatic  Patient Position (if appropriate) Lying  Pulse Rate 88  Pulse Rate Source Monitor  ECG Heart Rate 87  Resp 20  Oxygen Therapy  SpO2 96 %  O2 Device Nasal Cannula  O2 Flow Rate (L/min) 3 L/min  Post Treatment  Dialyzer Clearance Lightly streaked  Hemodialysis Intake (mL) 200 mL  Liters Processed 24  Fluid Removed (mL) -300 mL  Tolerated HD Treatment No (Comment)  Post-Hemodialysis Comments See PN  CVC Triple Lumen Right Internal jugular  No placement date or time found.   Orientation: Right  Location: Internal jugular  Indication for Insertion or Continuance of Line Hemodialysis  Site Assessment Clean, Dry, Intact  Proximal Lumen Status Flushed;Heparin locked;Dead end cap in place  Medial Lumen Status Saline locked  Distal Lumen Status Flushed;Heparin locked;Dead end cap in place  Dressing Type Transparent  Dressing Status Antimicrobial disc in place;Clean, Dry, Intact  Dressing Change Due 09/23/23    Arman Filter, RN [facility]

## 2023-09-19 ENCOUNTER — Inpatient Hospital Stay (HOSPITAL_COMMUNITY): Payer: Medicare PPO

## 2023-09-19 ENCOUNTER — Inpatient Hospital Stay (HOSPITAL_COMMUNITY)
Admit: 2023-09-19 | Discharge: 2023-09-19 | Disposition: A | Discharge status: Home / Self Care | Payer: Medicare PPO | Attending: Oncology | Admitting: Oncology

## 2023-09-19 DIAGNOSIS — I10 Essential (primary) hypertension: Secondary | ICD-10-CM | POA: Diagnosis not present

## 2023-09-19 DIAGNOSIS — R001 Bradycardia, unspecified: Secondary | ICD-10-CM | POA: Diagnosis not present

## 2023-09-19 DIAGNOSIS — I480 Paroxysmal atrial fibrillation: Secondary | ICD-10-CM | POA: Diagnosis not present

## 2023-09-19 DIAGNOSIS — N179 Acute kidney failure, unspecified: Secondary | ICD-10-CM | POA: Diagnosis not present

## 2023-09-19 LAB — HEPATITIS B SURFACE ANTIBODY, QUANTITATIVE: Hep B S AB Quant (Post): <3.5 m[IU]/mL — ABNORMAL LOW

## 2023-09-19 LAB — RENAL FUNCTION PANEL
Albumin: 2.6 g/dL — ABNORMAL LOW (ref 3.5–5.0)
Anion gap: 15 (ref 5–15)
BUN: 71 mg/dL — ABNORMAL HIGH (ref 8–23)
CO2: 21 mmol/L — ABNORMAL LOW (ref 22–32)
Calcium: 8.7 mg/dL — ABNORMAL LOW (ref 8.9–10.3)
Chloride: 99 mmol/L (ref 98–111)
Creatinine, Ser: 7.59 mg/dL — ABNORMAL HIGH (ref 0.61–1.24)
GFR, Estimated: 7 mL/min — ABNORMAL LOW (ref >60)
Glucose, Bld: 117 mg/dL — ABNORMAL HIGH (ref 70–99)
Phosphorus: 5.6 mg/dL — ABNORMAL HIGH (ref 2.5–4.6)
Potassium: 4.3 mmol/L (ref 3.5–5.1)
Sodium: 135 mmol/L (ref 135–145)

## 2023-09-19 LAB — URINE CULTURE: Culture: 20000 — AB

## 2023-09-19 LAB — CBC
HCT: 23.8 % — ABNORMAL LOW (ref 39.0–52.0)
Hemoglobin: 8.2 g/dL — ABNORMAL LOW (ref 13.0–17.0)
MCH: 33.7 pg (ref 26.0–34.0)
MCHC: 34.5 g/dL (ref 30.0–36.0)
MCV: 97.9 fL (ref 80.0–100.0)
Platelets: 220 10*3/uL (ref 150–400)
RBC: 2.43 MIL/uL — ABNORMAL LOW (ref 4.22–5.81)
RDW: 13.2 % (ref 11.5–15.5)
WBC: 12.9 10*3/uL — ABNORMAL HIGH (ref 4.0–10.5)
nRBC: 0 % (ref 0.0–0.2)

## 2023-09-19 LAB — HEPARIN LEVEL (UNFRACTIONATED): Heparin Unfractionated: 0.19 [IU]/mL — ABNORMAL LOW (ref 0.30–0.70)

## 2023-09-19 MED ORDER — HEPARIN SODIUM (PORCINE) 1000 UNIT/ML IJ SOLN
INTRAMUSCULAR | Status: AC
  Filled 2023-09-19: qty 4

## 2023-09-19 MED ORDER — ALBUMIN HUMAN 25 % IV SOLN
25.0000 g | INTRAVENOUS | Status: AC | PRN
Start: 2023-09-19 — End: 2023-09-19
  Administered 2023-09-19 (×2): 25 g via INTRAVENOUS

## 2023-09-19 MED ORDER — ALBUMIN HUMAN 25 % IV SOLN
INTRAVENOUS | Status: AC
  Filled 2023-09-19: qty 100

## 2023-09-19 NOTE — Progress Notes (Signed)
Patient triggered yellow mews. Patient does not appear to be in distress at this time. Notified Dr. Laural Benes and Charge RN Andria Rhein. No new orders at this time. Yellow mews protocol initiated.     09/19/23 0935  Assess: MEWS Score  Temp (!) 97.4 F (36.3 C)  BP 111/68  MAP (mmHg) 81  Pulse Rate 100  Resp (!) 27  SpO2 92 %  O2 Device Nasal Cannula  O2 Flow Rate (L/min) 4 L/min  Assess: MEWS Score  MEWS Temp 0  MEWS Systolic 0  MEWS Pulse 0  MEWS RR 2  MEWS LOC 0  MEWS Score 2  MEWS Score Color Yellow  Assess: if the MEWS score is Yellow or Red  Were vital signs accurate and taken at a resting state? Yes  Does the patient meet 2 or more of the SIRS criteria? No  Does the patient have a confirmed or suspected source of infection? No  MEWS guidelines implemented  Yes, yellow  Treat  MEWS Interventions Considered administering scheduled or prn medications/treatments as ordered  Take Vital Signs  Increase Vital Sign Frequency  Yellow: Q2hr x1, continue Q4hrs until patient remains green for 12hrs  Escalate  MEWS: Escalate Yellow: Discuss with charge nurse and consider notifying provider and/or RRT  Notify: Charge Nurse/RN  Name of Charge Nurse/RN Notified Andria Rhein, RN  Provider Notification  Provider Name/Title Dr. Laural Benes  Date Provider Notified 09/19/23  Time Provider Notified [phone]  Method of Notification Page  Notification Reason Other (Comment) (yellow mews)  Provider response No new orders  Date of Provider Response 09/19/23  Time of Provider Response 0949  Assess: SIRS CRITERIA  SIRS Temperature  0  SIRS Pulse 1  SIRS Respirations  1  SIRS WBC 0  SIRS Score Sum  2

## 2023-09-19 NOTE — Progress Notes (Signed)
PHARMACY - ANTICOAGULATION CONSULT NOTE  Pharmacy Consult for Heparin Indication: atrial fibrillation  Allergies  Allergen Reactions   Digoxin And Related Other (See Comments)    Patient Measurements: Height: 5\' 9"  (175.3 cm) Weight: 84.8 kg (186 lb 15.2 oz) IBW/kg (Calculated) : 70.7 HEPARIN DW (KG): 79.4   Vital Signs: Temp: 98.5 F (36.9 C) (12/10 0508) Temp Source: Oral (12/10 0508) BP: 127/76 (12/10 0508) Pulse Rate: 95 (12/10 0508)  Labs: Recent Labs    09/17/23 0342 09/17/23 1854 09/18/23 0405 09/19/23 0438  HGB 9.1*  --  8.6* 8.2*  HCT 27.4*  --  25.4* 23.8*  PLT 214  --  228 220  HEPARINUNFRC 0.22* 0.40  --  0.19*  CREATININE 8.95*  --  8.93* 7.59*    Estimated Creatinine Clearance: 7.6 mL/min (A) (by C-G formula based on SCr of 7.59 mg/dL (H)).   Medical History: Past Medical History:  Diagnosis Date   Bradycardia    Diabetes mellitus without complication (HCC)    Hyperlipidemia    Hypertension    NUCLEAR STRESS TEST, 07/17/2007 - EKG negative ischemia   Memory loss    Murmur    2D ECHO, 02/04/2009 - EF 55-65%, normal   TIA (transient ischemic attack)     Medications:  Medications Prior to Admission  Medication Sig Dispense Refill Last Dose   amLODipine (NORVASC) 10 MG tablet Take 10 mg by mouth daily.   09/06/2023   carbidopa-levodopa (SINEMET IR) 25-100 MG tablet Take 1 tablet by mouth 3 (three) times daily.   09/06/2023   GARLIC PO Take 1 tablet by mouth daily.   09/06/2023   Multiple Vitamin (MULTIVITAMIN) tablet Take 1 tablet by mouth daily.   09/06/2023   NAMZARIC 28-10 MG CP24 Take 1 capsule by mouth at bedtime.   09/05/2023   pravastatin (PRAVACHOL) 40 MG tablet TAKE 1 TABLET BY MOUTH ONCE DAILY. 90 tablet 4 09/06/2023   sertraline (ZOLOFT) 25 MG tablet Take 1 tablet by mouth daily.   09/06/2023   tamsulosin (FLOMAX) 0.4 MG CAPS capsule Take 0.4 mg by mouth daily.   09/05/2023   telmisartan-hydrochlorothiazide (MICARDIS HCT) 80-25 MG  per tablet Take 1 tablet by mouth daily.   09/05/2023    Assessment: Mr. Douthat is an 82 yo male who presented to [facility], ED on 09/06/2023 for evaluation of fatigue and weakness. Was found to have an AKI with creatinine elevated at 7.82 . H/o HTN, HLD, Parkinsons, , prior TIA and bradycardia. Patient with new onset afib. Pharmacy asked to dose heparin. Patient to be evaluated by [facility] for urgent start PD which is n/a at [facility]. Not on any oral anticoagulants PTA.  Heparin was off partial day yesterday for HD catheter to be placed. It was later restarted on 12/10. Heparin level low this morning at 0.19 on 1250 units/hr. No bleeding or IV issues overnight.   Goal of Therapy:  Heparin level 0.3-0.7 units/ml Monitor platelets by anticoagulation protocol: Yes   Plan:  Increase heparin to 1400 units/hr Recheck heparin level this evening  Thank you for involving pharmacy in this patient's care.   Sheppard Coil PharmD., BCPS Clinical Pharmacist 09/19/2023 8:59 AM

## 2023-09-19 NOTE — Progress Notes (Signed)
PROGRESS NOTE   Raymond Burton  MVH:[id number] DOB: [birth year] DOA: 09/06/2023 PCP: Renaye Rakers, MD   Chief Complaint  Patient presents with   Fatigue   Level of care: Telemetry Medical  Brief Admission History:  82 y.o. male with medical history significant for bradycardia, hypertension, prediabetes, Parkinson's, TIA.  Patient was brought to the ED by family with complaints of generalized weakness. 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  Family reports gradual weakness over the past several weeks to months, over the past 3 days it has been more pronounced, with patient lying in bed.  He also reports gradual onset of poor appetite in the same time period, which is different from his baseline.  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  He is on IV heparin for stroke prevention due to likely upcoming procedures for PD start up.  Nephrology has ordered further serologies.  Continues to be followed by nephrology and currently waiting for bed at [facility] with tentative plan for urgent PD start up.    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  -UA with small hemoglobin, 30 protein -Renal ultrasound with no signs of hydronephrosis -Inserted Foley for BOO, replaced foley 12/7 -Appreciate ongoing nephrology evaluation -Held hydrochlorothiazide/telmisartan (25/80) -Appears that creatinine was 1.3 per PCP on 05/2023 -nephrology following closely for renal recovery -proteinuria being worked up by nephrology, with abnormal free light chains ratio will need hem/onc evaluation with concern for multiple myeloma  -family discussed with family member who is a nephrologist at [facility] and requested to transfer patient to [facility] for  urgent start PD which is not available at [facility].  I have called [facility] and patient has been accepted by Dr. Molli Barrows to the nephrology service. No beds are available but patient has been marked for priority transfer and [facility] will notify us when a bed is available and arrange for transfer.  Pt has been waiting over 5 days now for a bed at [facility].  - I updated [facility] today 12/7 and 12/10 regarding current clinical condition and progressive uremia symptoms.  They still report they don't have a bed for patient at [facility].  - 12/8: pt having more uremia symptoms, I reached out to Dr. Ronalee Belts and discussed with family, they are agreeable to starting hemodialysis here. I reached out to Dr. Henreitta Leber and requested evaluation for temporary HD catheter placement. I also called to oncology on call to request consultation regarding possible myeloma kidney and Dr Ellin Saba requesting bone marrow biopsy.   - nephrology started HD 12/9 at [facility] after dialysis catheter placement  - nephrology gave 2nd HD treatment 12/10 and 1.5 L fluid removed - family now requesting transfer to [facility] or [facility].  Reached out to [facility] 12/10: no beds available to accept transfer today, can try again in 1-2 days after they have more discharges; Family later in day requesting transfer to [facility] since he will go tomorrow for bone marrow biopsy.  Transferring to [facility].       Question of myeloma kidney - renal biopsy tentatively planned for 12/16.  - I have called out to oncology on call to request consult 09/17/23 - Dr. Ellin Saba recommending bone marrow biopsy which is planned to be done  when he has a renal biopsy with IR in [place] - IR planning bone marrow biopsy on 12/11 - planning for renal biopsy on Mon 12/16 (needs to be 5 days off aspirin)  Elevated troponin Troponin 458 > 475    - Currently chest pain-free.  EKG without ST or T wave changes. - EDP talked to cardiology, does not feel that this is very concerning with normal EKG in the  setting of significant renal dysfunction, should be okay for patient to stay at [facility].  -Echocardiogram with no wall motion abnormalities and preserved LVEF -IV heparin discontinued 11/29 but will be restarted 12/6 with findings of atrial fibrillation.    Enterococcus UTI - replaced foley 12/7 - foley placed for bladder outlet obstruction - discussed with pharm D and started on ampicillin  - follow urine culture and sensitivity - consider removal of foley tomorrow and do voiding trial on 12/11  Leukocytosis - secondary to enterococcus UTI - antibiotics as ordered  - BC x 2 no growth to date - CXR no active findings - urinalysis suggesting infection - procalcitonin not helpful given it can be markedly elevated with ESRD  - blood culture x 2 no growth to date - WBC trending down   Paroxysmal Atrial Fibrillation new diagnosis 09/15/23  - appreciate cardiology consultation - pt has been started on rhythm control strategy - he is on amiodarone 200 mg BID  - he is a reasonable candidate for DOAC but since pending urgent PD start up at [facility] will have him on IV heparin for stroke prophylaxis  - pt had Afib RVR overnight and given another small dose of IV digoxin.  Would not give more digoxin given his ESRD - added digoxin to allergy list to prompt any provider to avoid giving this  - he is on IV heparin for stroke prevention due to upcoming procedures for urgent PD startup - plan to discharge home on apixaban 2.5 mg BID per cardiology recommendation - plan for amiodarone per cardiology is 200 mg BID x 3 weeks total followed by 200 mg daily    Parkinson disease Follows with neurology.  Baseline dementia, at baseline he can answer simple questions, but on bad days he is barely able to communicate. -Resumed home dose Sinemet   GERD - pantoprazole ordered for GI protection  Pre-diabetes - Hgba1c 6.0% - diet controlled   Bradycardia Sinus bradycardia by EKG.  Heart rate 49-63.   History of bradycardia.  Not on rate limiting medications. -TSH normal at 2.05   Essential hypertension -soft BPs -amlodipine discontinued   Worsening anemia of CKD No indication for ESA Iron panel ordered per nephrology   Constipation-resolved Started MiraLAX and Senokot    DVT prophylaxis:  IV heparin Code Status: Full Family Communication: Wife present at bedside 12/4, 12/5, 12/6, son 12/7, 12/8, 12/9, 12/10 Disposition Plan: Awaiting bed at [facility] to transfer but now family requesting transfer to [facility]   Consultants:  Nephrology  Procedures:   Antimicrobials:    Subjective: Pt remains somnolent, having more chest congestion and crackles       Objective: Vitals:   09/19/23 1345 09/19/23 1355 09/19/23 1406 09/19/23 1443  BP: 112/68 115/75  103/70  Pulse:  95  90  Resp: (!) 24 (!) 22  19  Temp:  97.7 F (36.5 C)    TempSrc:  Axillary    SpO2: 94% 93% 91% 93%  Weight:  83.9 kg    Height:  Intake/Output Summary (Last 24 hours) at 09/19/2023 1733 Last data filed at 09/19/2023 1517 Gross per 24 hour  Intake 459.75 ml  Output 2475 ml  Net -2015.25 ml   Filed Weights   09/18/23 1404 09/19/23 1134 09/19/23 1355  Weight: 84.8 kg 85.2 kg 83.9 kg   Examination:  General exam: Appears calm and comfortable. Lying in bed. Somnolent but arousable.   Respiratory system: coarse crackles bilateral.  Cardiovascular system: normal S1 & S2 heard. 1+ JVD, no murmurs, rubs, gallops or clicks. No pedal edema. Gastrointestinal system: Abdomen is nondistended, soft and nontender. No organomegaly or masses felt. Normal bowel sounds heard. Central nervous system: Alert and oriented. No focal neurological deficits. Extremities: 1-2+ edema BLEs. Symmetric 5 x 5 power. Skin: No rashes, lesions or ulcers. Psychiatry: Judgement and insight appear diminished. Mood & affect appropriate.   Data Reviewed: I have personally reviewed following labs and imaging studies  CBC: Recent Labs   Lab 09/15/23 0128 09/16/23 0440 09/17/23 0342 09/18/23 0405 09/19/23 0438  WBC 15.6* 18.8* 18.4* 14.3* 12.9*  HGB 9.0* 9.4* 9.1* 8.6* 8.2*  HCT 26.1* 28.8* 27.4* 25.4* 23.8*  MCV 97.4 100.7* 99.6 98.8 97.9  PLT 206 209 214 228 220    Basic Metabolic Panel: Recent Labs  Lab 09/13/23 0407 09/14/23 0344 09/15/23 0122 09/15/23 0713 09/16/23 0440 09/17/23 0342 09/18/23 0405 09/19/23 0438  NA 135   < > 136  --  135 133* 133* 135  K 3.8   < > 4.2 4.1 4.3 4.3 4.0 4.3  CL 100   < > 101  --  99 100 101 99  CO2 23   < > 22  --  20* 20* 19* 21*  GLUCOSE 103*   < > 117*  --  113* 99 96 117*  BUN 76*   < > 80*  --  84* 90* 94* 71*  CREATININE 8.19*   < > 8.26*  --  8.47* 8.95* 8.93* 7.59*  CALCIUM 8.8*   < > 8.8*  --  8.9 8.7* 8.7* 8.7*  MG 1.8  --  1.7  --   --   --   --   --   PHOS 5.2*   < > 4.4  --  4.4 4.2 4.2 5.6*   < > = values in this interval not displayed.    CBG: No results for input(s): "GLUCAP" in the last 168 hours.  Recent Results (from the past 240 hour(s))  Remove and replace urinary cath (placed > 5 days) then obtain urine culture from new indwelling urinary catheter.     Status: Abnormal   Collection Time: 09/16/23  8:08 AM   Specimen: Urine, Catheterized  Result Value Ref Range Status   Specimen Description   Final    URINE, CATHETERIZED Performed at [facility], [address], [place], [place] [address]    Special Requests   Final    Immunocompromised Performed at [facility], [address] [place], [place] [address]    Culture 20,000 COLONIES/mL ENTEROCOCCUS FAECALIS (A)  Final   Report Status 09/19/2023 FINAL  Final   Organism ID, Bacteria ENTEROCOCCUS FAECALIS (A)  Final      Susceptibility   Enterococcus faecalis - MIC*    AMPICILLIN <=2 SENSITIVE Sensitive     NITROFURANTOIN <=16 SENSITIVE Sensitive     VANCOMYCIN 1 SENSITIVE Sensitive     * 20,000 COLONIES/mL ENTEROCOCCUS FAECALIS  Culture, blood (Routine X 2) w Reflex to ID Panel  Status: None (Preliminary result)   Collection Time: 09/16/23  8:55 AM   Specimen: BLOOD RIGHT HAND  Result Value Ref Range Status   Specimen Description   Final    BLOOD RIGHT HAND BOTTLES DRAWN AEROBIC AND ANAEROBIC   Special Requests Blood Culture adequate volume  Final   Culture   Final    NO GROWTH 3 DAYS Performed at [facility], [address], [place], [place] [address]    Report Status PENDING  Incomplete  Culture, blood (Routine X 2) w Reflex to ID Panel     Status: None (Preliminary result)   Collection Time: 09/16/23  8:55 AM   Specimen: BLOOD LEFT HAND  Result Value Ref Range Status   Specimen Description   Final    BLOOD LEFT HAND BOTTLES DRAWN AEROBIC AND ANAEROBIC   Special Requests Blood Culture adequate volume  Final   Culture   Final    NO GROWTH 3 DAYS Performed at [facility], [address] [place], [place] [address]    Report Status PENDING  Incomplete     Radiology Studies: DG CHEST PORT 1 VIEW  Result Date: 09/19/2023 CLINICAL DATA:  Chest congestion and cough EXAM: PORTABLE CHEST 1 VIEW COMPARISON:  09/18/2023 FINDINGS: Cardiomegaly. Right internal jugular central line tip in the SVC above the right atrium. Slight worsening of pulmonary venous hypertension and pulmonary edema. Worsening of volume loss in the lower lobes. Possible small effusions. IMPRESSION: Slight worsening of pulmonary venous hypertension and pulmonary edema. Worsening of volume loss in the lower lobes. Possible small effusions. Electronically Signed   By: Paulina Fusi M.D.   On: 09/19/2023 08:20   DG Chest Portable 1 View  Result Date: 09/18/2023 CLINICAL DATA:  Central line placement. EXAM: PORTABLE CHEST 1 VIEW COMPARISON:  September 15, 2023. FINDINGS: Stable cardiomediastinal silhouette. Interval placement of right internal jugular catheter with distal tip in expected position of SVC. No pneumothorax is noted. Increased right perihilar opacity is noted concerning for possible  infiltrate. Minimal right basilar atelectasis and small pleural effusion is noted. IMPRESSION: Interval placement of right internal jugular catheter with distal tip in expected position of the SVC. Increased right perihilar and basilar opacities as noted above. Electronically Signed   By: Lupita Raider M.D.   On: 09/18/2023 09:45    Scheduled Meds:  amiodarone  200 mg Oral BID   amoxicillin  500 mg Oral Q12H   atorvastatin  40 mg Oral Daily   Chlorhexidine Gluconate Cloth  6 each Topical Daily   Chlorhexidine Gluconate Cloth  6 each Topical Q0600   levalbuterol  0.63 mg Nebulization Q6H   pantoprazole  40 mg Oral QHS   polyethylene glycol  17 g Oral Daily   senna-docusate  1 tablet Oral BID   tamsulosin  0.4 mg Oral Daily   Continuous Infusions:  heparin 1,250 Units/hr (09/19/23 1148)    LOS: 13 days   Time spent: 58 mins  Kalik Hoare, MD How to contact the [facility] Attending or Consulting provider 7A - 7P or covering provider during after hours 7P -7A, for this patient?  Check the care team in [facility] and look for a) attending/consulting TRH provider listed and b) the [facility] team listed Log into [email or web address] to find provider on call.  Locate the [facility] provider you are looking for under [facility] and page to a number that you can be directly reached. If you still have difficulty reaching the provider, please page the [facility] (Director on Call)  for the Hospitalists listed on amion for assistance.  09/19/2023, 5:33 PM

## 2023-09-19 NOTE — Discharge Summary (Signed)
Discharge Summary  Raymond Burton ZOX:[id number] DOB: [birth year]  PCP: Renaye Rakers, MD  Admit date: 09/06/2023 Discharge date: 09/19/2023  Time spent: 35 minutes   Recommendations for Outpatient Follow-up:  Transfer to [facility].   Discharge Diagnoses:  Active Hospital Problems   Diagnosis Date Noted   AKI (acute kidney injury) (HCC) 09/06/2023   Elevated troponin 09/06/2023    Priority: High   Paroxysmal atrial fibrillation (HCC) 09/15/2023   Pre-diabetes 09/06/2023   Parkinson disease (HCC) 09/06/2023   Bradycardia 03/29/2013   Essential hypertension 03/29/2013    Resolved Hospital Problems  No resolved problems to display.    Vitals:   09/19/23 1943 09/19/23 2003  BP:  107/66  Pulse:  92  Resp:  (!) 24  Temp:  98.7 F (37.1 C)  SpO2: 90% 93%    History of present illness:  Raymond Burton  WJX:[id number]  DOB: 05-08-1941  82 y.o. male with medical history significant for bradycardia, hypertension, prediabetes, Parkinson's, TIA.  Patient was brought to the ED by family with complaints of generalized weakness. 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  Family reports gradual weakness over the past several weeks to months, over the past 3 days it has been more pronounced, with patient lying in bed.  He also reports gradual onset of poor appetite in the same time period, which is different from his baseline.  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  He is on IV heparin for stroke prevention due to likely upcoming procedures.  Nephrology has started hemodialysis on 12/9.  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  Oncology consulted and requested a bone marrow biopsy.  He is scheduled to have a bone marrow biopsy and a renal biopsy at [facility] on 12/11.  Family had requested transfer to [facility].  He is on a waiting list to transfer to [facility].  He has been  waiting for a bed at [facility] for 5 days.   Family requested that he transfer to [facility] since he will need to stay at [facility] for renal biopsy.   He is volume overloaded from ESRD.  1.5 L of fluid removed with dialysis today.  His Afib is controlled on oral amiodarone.  He is on IV heparin for anticoagulation with plan to DC on oral apixaban.   Addendum: The patient's family requested that the patient be transferred to [facility] for his renal care.    The patient was accepted at [facility], admitting physician Dr. Molli Barrows.  09/19/2023: The patient was seen and examined at his bedside.  His wife and son were present in the room.  He is alert and in no acute distress.  He has no new complaints.  He is currently on 5 L nasal cannula with O2 saturation of 93%.  No increased work of breathing or use of accessory muscles to breathe on exam.   Hospital Course:  Principal Problem:   AKI (acute kidney injury) (HCC) Active Problems:   Elevated troponin   Essential hypertension   Bradycardia   Pre-diabetes   Parkinson disease (HCC)   Paroxysmal atrial fibrillation (HCC)  Per primary team, Dr. Laural Benes, [facility], hospitalist service. 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  -UA with small hemoglobin, 30 protein -Renal ultrasound with no signs of hydronephrosis -Inserted  Foley for BOO, replaced foley 12/7 -Appreciate ongoing nephrology evaluation -Held hydrochlorothiazide/telmisartan (25/80) -Appears that creatinine was 1.3 per PCP on 05/2023 -nephrology following closely for renal recovery -proteinuria being worked up by nephrology, with abnormal free light chains ratio will need hem/onc evaluation with concern for multiple myeloma  -family discussed with family member who is a nephrologist at [facility] and requested to transfer patient to [facility] for urgent start PD which is not available at [facility].  I have  called [facility] and patient has been accepted by Dr. Molli Barrows to the nephrology service. No beds are available but patient has been marked for priority transfer and [facility] will notify us when a bed is available and arrange for transfer.  Pt has been waiting over 5 days now for a bed at [facility].  - I updated [facility] today 12/7 and 12/10 regarding current clinical condition and progressive uremia symptoms.  They still report they don't have a bed for patient at [facility].  - 12/8: pt having more uremia symptoms, I reached out to Dr. Ronalee Belts and discussed with family, they are agreeable to starting hemodialysis here. I reached out to Dr. Henreitta Leber and requested evaluation for temporary HD catheter placement. I also called to oncology on call to request consultation regarding possible myeloma kidney and Dr Ellin Saba requesting bone marrow biopsy.   - nephrology started HD 12/9 at [facility] after dialysis catheter placement  - nephrology gave 2nd HD treatment 12/10 and 1.5 L fluid removed - family now requesting transfer to [facility] or [facility].  Reached out to [facility] 12/10: no beds available to accept transfer today, can try again in 1-2 days after they have more discharges; Family later in day requesting transfer to [facility] since he will go tomorrow for bone marrow biopsy.  Transferring to [facility].       Question of myeloma kidney - renal biopsy tentatively planned for 12/16.  - I have called out to oncology on call to request consult 09/17/23 - Dr. Ellin Saba recommending bone marrow biopsy which is planned to be done when he has a renal biopsy with IR in [place] - IR planning bone marrow biopsy on 12/11 - planning for renal biopsy on Mon 12/16 (needs to be 5 days off aspirin)   Elevated troponin Troponin 458 > 475    - Currently chest pain-free.  EKG without ST or T wave changes. - EDP talked to cardiology, does not feel that this is very concerning with normal EKG in the setting of significant renal dysfunction, should be okay for  patient to stay at [facility].  -Echocardiogram with no wall motion abnormalities and preserved LVEF -IV heparin discontinued 11/29 but will be restarted 12/6 with findings of atrial fibrillation.     Enterococcus UTI - replaced foley 12/7 - foley placed for bladder outlet obstruction - discussed with pharm D and started on ampicillin  - follow urine culture and sensitivity - consider removal of foley tomorrow and do voiding trial on 12/11   Leukocytosis - secondary to enterococcus UTI - antibiotics as ordered  - BC x 2 no growth to date - CXR no active findings - urinalysis suggesting infection - procalcitonin not helpful given it can be markedly elevated with ESRD  - blood culture x 2 no growth to date - WBC trending down    Paroxysmal Atrial Fibrillation new diagnosis 09/15/23  - appreciate cardiology consultation - pt has been started on rhythm control strategy - he is on amiodarone 200 mg  BID  - he is a reasonable candidate for DOAC but since pending urgent PD start up at [facility] will have him on IV heparin for stroke prophylaxis  - pt had Afib RVR overnight and given another small dose of IV digoxin.  Would not give more digoxin given his ESRD - added digoxin to allergy list to prompt any provider to avoid giving this  - he is on IV heparin for stroke prevention due to upcoming procedures for urgent PD startup - plan to discharge home on apixaban 2.5 mg BID per cardiology recommendation - plan for amiodarone per cardiology is 200 mg BID x 3 weeks total followed by 200 mg daily    Parkinson disease Follows with neurology.  Baseline dementia, at baseline he can answer simple questions, but on bad days he is barely able to communicate. -Resumed home dose Sinemet   GERD - pantoprazole ordered for GI protection   Pre-diabetes - Hgba1c 6.0% - diet controlled   Bradycardia Sinus bradycardia by EKG.  Heart rate 49-63.  History of bradycardia.  Not on rate limiting  medications. -TSH normal at 2.05   Essential hypertension -soft BPs -amlodipine discontinued   Worsening anemia of CKD No indication for ESA Iron panel ordered per nephrology   Constipation-resolved Started MiraLAX and Senokot    DVT prophylaxis:  IV heparin  Code Status: Full Family Communication: Wife present at bedside 12/4, 12/5, 12/6, son 12/7, 12/8, 12/9, 12/10 Disposition Plan: Awaiting bed at [facility] to transfer but now family requesting transfer to [facility]    Consultants:  Nephrology    Discharge Exam: BP 107/66 (BP Location: Left Arm)   Pulse 92   Temp 98.7 F (37.1 C) (Oral)   Resp (!) 24   Ht 5\' 9"  (1.753 m)   Wt 83.9 kg   SpO2 93%   BMI 27.31 kg/m  General: 82 y.o. year-old male well developed well nourished in no acute distress.  Alert and verbal. Cardiovascular: Regular rate and rhythm with no rubs or gallops.  No thyromegaly or JVD noted.   Respiratory: Mild wheezing noted diffusely, with good inspiratory efforts.  Abdomen: Soft nontender nondistended with normal bowel sounds x4 quadrants. Musculoskeletal: Trace lower extremity edema bilaterally.   Psychiatry: Mood is appropriate for condition and setting  Discharge Instructions You were cared for by a hospitalist during your hospital stay. If you have any questions about your discharge medications or the care you received while you were in the hospital after you are discharged, you can call the unit and asked to speak with the hospitalist on call if the hospitalist that took care of you is not available. Once you are discharged, your primary care physician will handle any further medical issues. Please note that NO REFILLS for any discharge medications will be authorized once you are discharged, as it is imperative that you return to your primary care physician (or establish a relationship with a primary care physician if you do not have one) for your aftercare needs so that they can reassess your need for  medications and monitor your lab values.   Allergies as of 09/19/2023       Reactions   Digoxin And Related Other (See Comments)        Medication List     STOP taking these medications    amLODipine 10 MG tablet Commonly known as: NORVASC   carbidopa-levodopa 25-100 MG tablet Commonly known as: SINEMET IR   GARLIC PO   multivitamin tablet   Namzaric  28-10 MG Cp24 Generic drug: Memantine HCl-Donepezil HCl   pravastatin 40 MG tablet Commonly known as: PRAVACHOL   sertraline 25 MG tablet Commonly known as: ZOLOFT   tamsulosin 0.4 MG Caps capsule Commonly known as: FLOMAX   telmisartan-hydrochlorothiazide 80-25 MG tablet Commonly known as: MICARDIS HCT       Allergies  Allergen Reactions   Digoxin And Related Other (See Comments)    Follow-up Information     [facility] Follow up.   Specialty: Home Health Services Why: PT will call to schedule your home visit. Contact information: [address] [place] [address] [phone]                  The results of significant diagnostics from this hospitalization (including imaging, microbiology, ancillary and laboratory) are listed below for reference.    Significant Diagnostic Studies: DG CHEST PORT 1 VIEW  Result Date: 09/19/2023 CLINICAL DATA:  Chest congestion and cough EXAM: PORTABLE CHEST 1 VIEW COMPARISON:  09/18/2023 FINDINGS: Cardiomegaly. Right internal jugular central line tip in the SVC above the right atrium. Slight worsening of pulmonary venous hypertension and pulmonary edema. Worsening of volume loss in the lower lobes. Possible small effusions. IMPRESSION: Slight worsening of pulmonary venous hypertension and pulmonary edema. Worsening of volume loss in the lower lobes. Possible small effusions. Electronically Signed   By: Paulina Fusi M.D.   On: 09/19/2023 08:20   DG Chest Portable 1 View  Result Date: 09/18/2023 CLINICAL DATA:  Central line placement.  EXAM: PORTABLE CHEST 1 VIEW COMPARISON:  September 15, 2023. FINDINGS: Stable cardiomediastinal silhouette. Interval placement of right internal jugular catheter with distal tip in expected position of SVC. No pneumothorax is noted. Increased right perihilar opacity is noted concerning for possible infiltrate. Minimal right basilar atelectasis and small pleural effusion is noted. IMPRESSION: Interval placement of right internal jugular catheter with distal tip in expected position of the SVC. Increased right perihilar and basilar opacities as noted above. Electronically Signed   By: Lupita Raider M.D.   On: 09/18/2023 09:45   DG CHEST PORT 1 VIEW  Result Date: 09/15/2023 CLINICAL DATA:  82 year old male with bradycardia, hypertension, leukocytosis, weakness. EXAM: PORTABLE CHEST 1 VIEW COMPARISON:  Chest radiographs 09/06/2023. FINDINGS: Portable AP upright view at 0844 hours. Slightly lower lung volumes. Stable mild cardiomegaly and tortuosity of the aorta. Visualized tracheal air column is within normal limits. Allowing for portable technique the lungs are clear. No pneumothorax or pleural effusion. Negative visible bowel gas. No acute osseous abnormality identified. IMPRESSION: No acute cardiopulmonary abnormality. Stable cardiomegaly, tortuous aorta. Electronically Signed   By: Odessa Fleming M.D.   On: 09/15/2023 11:38   DG Bone Survey Met  Result Date: 09/14/2023 CLINICAL DATA:  Multiple myeloma. EXAM: METASTATIC BONE SURVEY COMPARISON:  Chest radiographs 09/06/2023 and abdominopelvic CT 07/03/2017. FINDINGS: Lateral view of the skull demonstrates no lytic lesions. No lytic lesions or pathologic fractures are identified within the axial skeleton. Mild diffuse spondylosis with a grade 1 degenerative anterolisthesis at L4-5. The heart size and mediastinal contours are stable. Mild atelectasis at both lung bases. No lytic lesions, pathologic fractures or significant arthropathic changes are identified within the  visualized appendicular skeleton. IMPRESSION: 1. No radiographic evidence of multiple myeloma within the axial or visualized appendicular skeleton. 2. Mild multilevel spondylosis.  No acute osseous findings. Electronically Signed   By: Carey Bullocks M.D.   On: 09/14/2023 15:15   ECHOCARDIOGRAM COMPLETE  Result Date: 09/07/2023  ECHOCARDIOGRAM REPORT   Patient Name:   FARIS SOLLARS Date of Exam: 09/07/2023 Medical Rec #:  [id number]     Height:       69.0 in Accession #:    [id number]    Weight:       170.4 lb Date of Birth:  October 28, 1940    BSA:          1.930 m Patient Age:    82 years      BP:           120/73 mmHg Patient Gender: M             HR:           75 bpm. [facility] Procedure: 2D Echo, Color Doppler and Cardiac Doppler Indications:    elevated troponin  History:        Patient has prior history of Echocardiogram examinations, most                 recent 02/04/2009. Parkinson's disease, Signs/Symptoms:Murmur;                 Risk Factors:Hypertension and Dyslipidemia.  Sonographer:    Delcie Roch RDCS Referring Phys: [id number] Heloise Beecham EMOKPAE IMPRESSIONS  1. Left ventricular ejection fraction, by estimation, is 60 to 65%. The left ventricle has normal function. The left ventricle has no regional wall motion abnormalities. 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 Left  ventricular diastolic parameters were normal.  2. Right ventricular systolic function is normal. The right ventricular size is normal. There is normal pulmonary artery systolic pressure.  3. Left atrial size was severely dilated.  4. The mitral valve is normal in structure. Trivial mitral valve regurgitation. No evidence of mitral stenosis.  5. The aortic valve is tricuspid. Aortic valve regurgitation is trivial. No aortic stenosis is present.  6. Aortic dilatation noted. There is borderline dilatation of the ascending aorta, measuring 38 mm.  7. The inferior vena cava is  normal in size with greater than 50% respiratory variability, suggesting right atrial pressure of 3 mmHg. Comparison(s): No prior Echocardiogram. FINDINGS  Left Ventricle: Left ventricular ejection fraction, by estimation, is 60 to 65%. The left ventricle has normal function. The left ventricle has no regional wall motion abnormalities. The left ventricular internal cavity size was normal in size. 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 Left ventricular diastolic parameters were normal. Normal left ventricular filling pressure. Right Ventricle: The right ventricular size is normal. No increase in right ventricular wall thickness. Right ventricular systolic function is normal. There is normal pulmonary artery systolic pressure. The tricuspid regurgitant velocity is 2.57 m/s, and  with an assumed right atrial pressure of 3 mmHg, the estimated right ventricular systolic pressure is 29.4 mmHg. Left Atrium: Left atrial size was severely dilated. Right Atrium: Right atrial size was normal in size. Pericardium: There is no evidence of pericardial effusion. Mitral Valve: The mitral valve is normal in structure. Trivial mitral valve regurgitation. No evidence of mitral valve stenosis. Tricuspid Valve: The tricuspid valve is normal in structure. Tricuspid valve regurgitation is trivial. No evidence of tricuspid stenosis. Aortic Valve: The aortic valve is tricuspid. Aortic valve regurgitation is trivial. No aortic stenosis is present. Pulmonic Valve: The pulmonic valve was normal in structure. Pulmonic valve regurgitation is mild. No evidence of pulmonic stenosis. Aorta: The aortic root is normal in size and structure and aortic dilatation noted. There  is borderline dilatation of the ascending aorta, measuring 38 mm. Venous: The inferior vena cava is normal in size with greater than 50% respiratory variability, suggesting right atrial pressure of 3 mmHg. IAS/Shunts: No atrial level shunt  detected by color flow Doppler.  LEFT VENTRICLE PLAX 2D LVIDd:         4.30 cm   Diastology LVIDs:         2.50 cm   LV e' medial:    4.57 cm/s LV PW:         1.40 cm   LV E/e' medial:  17.9 LV IVS:        1.50 cm   LV e' lateral:   7.07 cm/s LVOT diam:     2.20 cm   LV E/e' lateral: 11.6 LV SV:         79 LV SV Index:   41 LVOT Area:     3.80 cm  RIGHT VENTRICLE             IVC RV Basal diam:  3.10 cm     IVC diam: 1.80 cm RV S prime:     11.40 cm/s TAPSE (M-mode): 1.9 cm LEFT ATRIUM              Index        RIGHT ATRIUM           Index LA diam:        4.20 cm  2.18 cm/m   RA Area:     16.40 cm LA Vol (A2C):   111.0 ml 57.51 ml/m  RA Volume:   43.30 ml  22.43 ml/m LA Vol (A4C):   106.0 ml 54.92 ml/m LA Biplane Vol: 108.0 ml 55.96 ml/m  AORTIC VALVE             PULMONIC VALVE LVOT Vmax:   99.80 cm/s  PR End Diast Vel: 11.02 msec LVOT Vmean:  67.300 cm/s LVOT VTI:    0.207 m  AORTA Ao Root diam: 3.20 cm Ao Asc diam:  3.80 cm MITRAL VALVE               TRICUSPID VALVE MV Area (PHT): 4.36 cm    TR Peak grad:   26.4 mmHg MV Decel Time: 174 msec    TR Vmax:        257.00 cm/s MV E velocity: 81.80 cm/s MV A velocity: 65.10 cm/s  SHUNTS MV E/A ratio:  1.26        Systemic VTI:  0.21 m                            Systemic Diam: 2.20 cm Vishnu Priya Mallipeddi Electronically signed by Winfield Rast Mallipeddi Signature Date/Time: 09/07/2023/3:23:32 PM    Final    US Renal  Result Date: 09/07/2023 CLINICAL DATA:  82 year old male with history of renal failure. EXAM: RENAL / URINARY TRACT ULTRASOUND COMPLETE COMPARISON:  No priors. FINDINGS: Right Kidney: Renal measurements: 13.4 x 5.5 x 7.5 cm = volume: 298 mL. Diffusely increased parenchymal echogenicity. In the upper pole of the right kidney there is an 8.0 x 6.1 x 6.8 cm anechoic lesion with thick internal septation (up to 7 mm in thickness), but no definite internal blood flow. No hydronephrosis visualized. Left Kidney: Renal measurements: 12.0 x 7.0 x 5.3 cm  = volume: 232 mL. Diffusely increased parenchymal echogenicity. No mass or hydronephrosis visualized. Bladder: Urinary bladder is largely decompressed  around an indwelling Foley balloon catheter. Other: Incidental imaging of the right lobe of the liver demonstrates a well-circumscribed 1.6 x 1.5 cm echogenic lesion. IMPRESSION: 1. No hydronephrosis. 2. Diffusely echogenic renal parenchyma indicative of medical renal disease. 3. Complex cyst in the upper pole of the right kidney, likely benign, but attention on follow-up nonemergent MRI of the abdomen with and without IV gadolinium is recommended for definitive characterization. 4. Indeterminate echogenic lesion in the right lobe of the liver. Attention at time of forthcoming MRI is also recommended. This may simply represent a cavernous hemangioma, however, the possibility of underlying malignancy is not excluded. Electronically Signed   By: Trudie Reed M.D.   On: 09/07/2023 09:49   DG Chest 2 View  Result Date: 09/06/2023 CLINICAL DATA:  Shortness of breath.  Generalized weakness. EXAM: CHEST - 2 VIEW COMPARISON:  None Available. FINDINGS: Bilateral lung fields are clear. Bilateral costophrenic angles are clear. Normal cardio-mediastinal silhouette. No acute osseous abnormalities. The soft tissues are within normal limits. IMPRESSION: No active cardiopulmonary disease. Electronically Signed   By: Jules Schick M.D.   On: 09/06/2023 16:00    Microbiology: Recent Results (from the past 240 hour(s))  Remove and replace urinary cath (placed > 5 days) then obtain urine culture from new indwelling urinary catheter.     Status: Abnormal   Collection Time: 09/16/23  8:08 AM   Specimen: Urine, Catheterized  Result Value Ref Range Status   Specimen Description   Final    URINE, CATHETERIZED Performed at [facility], [address], [place], [place] [address]    Special Requests   Final    Immunocompromised Performed at [facility], [address], [place], [place] [address]    Culture 20,000 COLONIES/mL ENTEROCOCCUS FAECALIS (A)  Final   Report Status 09/19/2023 FINAL  Final   Organism ID, Bacteria ENTEROCOCCUS FAECALIS (A)  Final      Susceptibility   Enterococcus faecalis - MIC*    AMPICILLIN <=2 SENSITIVE Sensitive     NITROFURANTOIN <=16 SENSITIVE Sensitive     VANCOMYCIN 1 SENSITIVE Sensitive     * 20,000 COLONIES/mL ENTEROCOCCUS FAECALIS  Culture, blood (Routine X 2) w Reflex to ID Panel     Status: None (Preliminary result)   Collection Time: 09/16/23  8:55 AM   Specimen: BLOOD RIGHT HAND  Result Value Ref Range Status   Specimen Description   Final    BLOOD RIGHT HAND BOTTLES DRAWN AEROBIC AND ANAEROBIC   Special Requests Blood Culture adequate volume  Final   Culture   Final    NO GROWTH 3 DAYS Performed at [facility], [address], [place], [place] [address]    Report Status PENDING  Incomplete  Culture, blood (Routine X 2) w Reflex to ID Panel     Status: None (Preliminary result)   Collection Time: 09/16/23  8:55 AM   Specimen: BLOOD LEFT HAND  Result Value Ref Range Status   Specimen Description   Final    BLOOD LEFT HAND BOTTLES DRAWN AEROBIC AND ANAEROBIC   Special Requests Blood Culture adequate volume  Final   Culture   Final    NO GROWTH 3 DAYS Performed at [facility], [address] [place], [place] [address]    Report Status PENDING  Incomplete     Labs: Basic Metabolic Panel: Recent Labs  Lab 09/13/23 0407 09/14/23 0344 09/15/23 0122 09/15/23 0713 09/16/23 0440 09/17/23 0342 09/18/23 0405 09/19/23 0438  NA 135   < >  136  --  135 133* 133* 135  K 3.8   < > 4.2 4.1 4.3 4.3 4.0 4.3  CL 100   < > 101  --  99 100 101 99  CO2 23   < > 22  --  20* 20* 19* 21*  GLUCOSE 103*   < > 117*  --  113* 99 96 117*  BUN 76*   < > 80*  --  84* 90* 94* 71*  CREATININE 8.19*   < > 8.26*  --  8.47* 8.95* 8.93* 7.59*  CALCIUM 8.8*   < > 8.8*  --  8.9 8.7* 8.7* 8.7*  MG 1.8  --  1.7  --   --   --   --    --   PHOS 5.2*   < > 4.4  --  4.4 4.2 4.2 5.6*   < > = values in this interval not displayed.   Liver Function Tests: Recent Labs  Lab 09/15/23 0122 09/16/23 0440 09/17/23 0342 09/18/23 0405 09/19/23 0438  ALBUMIN 2.8* 2.8* 2.8* 2.6* 2.6*   No results for input(s): "LIPASE", "AMYLASE" in the last 168 hours. No results for input(s): "AMMONIA" in the last 168 hours. CBC: Recent Labs  Lab 09/15/23 0128 09/16/23 0440 09/17/23 0342 09/18/23 0405 09/19/23 0438  WBC 15.6* 18.8* 18.4* 14.3* 12.9*  HGB 9.0* 9.4* 9.1* 8.6* 8.2*  HCT 26.1* 28.8* 27.4* 25.4* 23.8*  MCV 97.4 100.7* 99.6 98.8 97.9  PLT 206 209 214 228 220   Cardiac Enzymes: No results for input(s): "CKTOTAL", "CKMB", "CKMBINDEX", "TROPONINI" in the last 168 hours. BNP: BNP (last 3 results) No results for input(s): "BNP" in the last 8760 hours.  ProBNP (last 3 results) No results for input(s): "PROBNP" in the last 8760 hours.  CBG: No results for input(s): "GLUCAP" in the last 168 hours.     Signed:  Darlin Drop, MD [facility] 09/19/2023, 9:21 PM

## 2023-09-19 NOTE — Progress Notes (Signed)
   IR Note  Pt is scheduled for Bone marrow bx at [facility] IR 12/11 To be at [facility] via [facility] by 830 am Pt will return to [facility] after bx  Plans are being made for pt to be transferred to [facility] to [facility] asap Planned for Random renal bx to be performed 12/16 LD dose ASA 12/10  Will do BM bx tomorrow and go back to [facility] Will watch for Tx to [facility] to [facility] and plan for Renal Bx 12/16 at [facility] Drs Laural Benes and Thedore Mins aware and agreeable

## 2023-09-19 NOTE — Progress Notes (Addendum)
[facility] NEPHROLOGY PROGRESS NOTE  Assessment/ Plan: Pt is a 82 y.o. yo male  with past medical history significant for hypertension, prediabetes, HLD, Parkinson's dementia, TIA who was presented to the hospital on 11/27 because of worsening fatigue and generalized weakness, seen as a consultation for the evaluation of acute kidney injury.   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  Reportedly the last serum creatinine level was 1.3 in 05/2023 and now presented with cr around 8 associated with poor oral intake  ongoing use of ARB, diuretics and possible bladder outlet obstruction.  Initially thought to be some component of ATN. Bone marrow survey was done which was negative for osteolytic lesion. 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 IF pending. -pending transfer to [facility] for consideration of urgent start PD. Been pending for a few days now. Would hold off on consider -concern for developing uremic symptoms. Temp HD catheter placed 12/9-appreciate surgery's assistance. HD#1 12/9. Plan for HD#2 today--will attempt UF, may need albumin support. Slow start protocol. Can plan for HD #3 tomorrow vs Thursday depending on clinical status. Plan for renal biopsy by tomorrow, bone marrow biopsy planned for today.  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   -Onc contacted by primary service, pending consult, will defer to oncologist for further management of myeloma, appreciate assistance. Renal biopsy planned for Wed 12/11, planned for bone marrow biopsy as well  # Hypertension: Monitor BP.  BP variable.  Currently not on antihypertensives.  # A-fib with RVR: cardiology has seen. On amio, plan for renally dosed eliquis on discharge. Currently in NSR based  on exam  # Anemia: Monitor hemoglobin, no ESA given paraproteinemia.  Discussed with patient's wife at the bedside this AM. Discussed with primary service.  Addendum: discussed with RN. Bone marrow cancelled by IR due to scheduling, plan for bone marrow and renal biopsy tomorrow AM. Wife is in agreement with plan provided he hasn't been transferred yet.  Addendum: renal biopsy won't be able to happen until next Monday given that he is on aspirin, needs to be off asa for 5 days. [facility] if [facility]'t been [facility] by then. Hopefully can get some pertinent data off bone marrow biopsy to help guide treatment. Discussed with IR and primary service.  Subjective: Seen and examined at the bedside.  Soft BP with HD yesterday, net uf positive 300cc. Wife at bedside. Patient drowsy this AM but easily arousable (received ativan overnight and last night, received trazodone last night). Wife is concerned for him sounding congested which he does. Still pending bed at [facility]. She would also like to explore transfer to [facility] and [facility] as well (informed primary). Apparently going to [facility] this AM via [facility] for bone marrow biopsy today.  Objective Vital signs in last 24 hours: Vitals:   09/18/23 2014 09/18/23 2226 09/19/23 0508 09/19/23 0706  BP:  (!) 99/59 127/76   Pulse:  81 95   Resp:   20   Temp:   98.5 F (36.9 C)   TempSrc:   Oral   SpO2: 90%  93% 92%  Weight:      Height:       Weight change:   Intake/Output Summary (Last 24 hours) at 09/19/2023 [phone]  Last data filed at 09/19/2023 0121 Gross per 24 hour  Intake 459.75 ml  Output 300 ml  Net 159.75 ml    Labs: RENAL PANEL Recent Labs  Lab 09/13/23 0407 09/14/23 0344 09/15/23 0122 09/15/23 0713 09/16/23 0440 09/17/23 0342 09/18/23 0405 09/19/23 0438  NA 135   < > 136  --  135 133* 133* 135  K 3.8   < > 4.2 4.1 4.3 4.3 4.0 4.3  CL 100   < > 101  --  99 100 101 99  CO2 23   < > 22  --  20* 20* 19*  21*  GLUCOSE 103*   < > 117*  --  113* 99 96 117*  BUN 76*   < > 80*  --  84* 90* 94* 71*  CREATININE 8.19*   < > 8.26*  --  8.47* 8.95* 8.93* 7.59*  CALCIUM 8.8*   < > 8.8*  --  8.9 8.7* 8.7* 8.7*  MG 1.8  --  1.7  --   --   --   --   --   PHOS 5.2*   < > 4.4  --  4.4 4.2 4.2 5.6*  ALBUMIN 3.0*   < > 2.8*  --  2.8* 2.8* 2.6* 2.6*   < > = values in this interval not displayed.    Liver Function Tests: Recent Labs  Lab 09/17/23 0342 09/18/23 0405 09/19/23 0438  ALBUMIN 2.8* 2.6* 2.6*   No results for input(s): "LIPASE", "AMYLASE" in the last 168 hours. No results for input(s): "AMMONIA" in the last 168 hours. CBC: Recent Labs    09/08/23 1001 09/09/23 0416 09/15/23 0128 09/16/23 0440 09/17/23 0342 09/18/23 0405 09/19/23 0438  HGB  --    < > 9.0* 9.4* 9.1* 8.6* 8.2*  MCV  --    < > 97.4 100.7* 99.6 98.8 97.9  FERRITIN 398*  --   --   --   --   --   --   TIBC 150*  --   --   --   --   --   --   IRON 80  --   --   --   --   --   --    < > = values in this interval not displayed.    Cardiac Enzymes: No results for input(s): "CKTOTAL", "CKMB", "CKMBINDEX", "TROPONINI" in the last 168 hours. CBG: No results for input(s): "GLUCAP" in the last 168 hours.  Iron Studies: No results for input(s): "IRON", "TIBC", "TRANSFERRIN", "FERRITIN" in the last 72 hours. Studies/Results: DG Chest Portable 1 View  Result Date: 09/18/2023 CLINICAL DATA:  Central line placement. EXAM: PORTABLE CHEST 1 VIEW COMPARISON:  September 15, 2023. FINDINGS: Stable cardiomediastinal silhouette. Interval placement of right internal jugular catheter with distal tip in expected position of SVC. No pneumothorax is noted. Increased right perihilar opacity is noted concerning for possible infiltrate. Minimal right basilar atelectasis and small pleural effusion is noted. IMPRESSION: Interval placement of right internal jugular catheter with distal tip in expected position of the SVC. Increased right perihilar and  basilar opacities as noted above. Electronically Signed   By: Lupita Raider M.D.   On: 09/18/2023 09:45    Medications: Infusions:  heparin 1,250 Units/hr (09/19/23 0121)    Scheduled Medications:  amiodarone  200 mg Oral BID   amoxicillin  500 mg Oral Q12H   aspirin EC  81 mg Oral Daily   atorvastatin  40 mg Oral Daily  Chlorhexidine Gluconate Cloth  6 each Topical Daily   Chlorhexidine Gluconate Cloth  6 each Topical Q0600   levalbuterol  0.63 mg Nebulization Q6H   pantoprazole  40 mg Oral QHS   polyethylene glycol  17 g Oral Daily   senna-docusate  1 tablet Oral BID   tamsulosin  0.4 mg Oral Daily    have reviewed scheduled and prn medications.  Physical Exam: General: drowsy Heart:RRR, s1s2 nl Lungs: rales right>left base, normal wob Abdomen: non-distended Extremities: trace edema b/l LEs Neurology: drowsy Dialysis access: RIJ temp HD catheter GU: +foley with red tinged urine  Dwanna Goshert 09/19/2023,7:42 AM  LOS: 13 days

## 2023-09-19 NOTE — Progress Notes (Signed)
   HEMODIALYSIS TREATMENT NOTE (HD#2):  Soft blood pressures again today.  UF was limited by hypotension despite cooling of dialysate to 35 degrees and administration of Albumin 25g x 2 doses.  Net UF 1.5 liters.  Increased O2 requirement, now on 5L and saturating low 90s.  Post-HD:  09/19/23 1355  Vitals  Temp 97.7 F (36.5 C)  Temp Source Axillary  BP 115/75  MAP (mmHg) 86  BP Location Left Arm  BP Method Automatic  Patient Position (if appropriate) Lying  Pulse Rate 95  Pulse Rate Source Monitor  ECG Heart Rate 87  Resp (!) 22  Oxygen Therapy  SpO2 93 %  O2 Device Nasal Cannula  O2 Flow Rate (L/min) 4 L/min  Post Treatment  Dialyzer Clearance Lightly streaked  Hemodialysis Intake (mL) 200 mL  Liters Processed 30  Fluid Removed (mL) 1500 mL  Tolerated HD Treatment No (Comment)  Post-Hemodialysis Comments UF was limited by hypotension despite Albumin administration x2  CVC Triple Lumen Right Internal jugular  No placement date or time found.   Orientation: Right  Location: Internal jugular  Indication for Insertion or Continuance of Line Hemodialysis  Exposed Catheter (cm) 0 cm  Site Assessment Clean, Dry, Intact  Proximal Lumen Status Flushed;Heparin locked;Dead end cap in place  Medial Lumen Status Saline locked  Distal Lumen Status Flushed;Heparin locked;Dead end cap in place  Dressing Type Transparent  Dressing Status Antimicrobial disc in place  Line Care Connections checked and tightened  Dressing Intervention Dressing reinforced  Dressing Change Due 09/23/23    Arman Filter, RN [facility]

## 2023-09-20 ENCOUNTER — Other Ambulatory Visit (HOSPITAL_COMMUNITY): Payer: Medicare PPO

## 2023-09-20 ENCOUNTER — Inpatient Hospital Stay (HOSPITAL_COMMUNITY): Payer: Medicare PPO

## 2023-09-20 DIAGNOSIS — I21A1 Myocardial infarction type 2: Secondary | ICD-10-CM | POA: Diagnosis not present

## 2023-09-20 DIAGNOSIS — I5031 Acute diastolic (congestive) heart failure: Secondary | ICD-10-CM | POA: Diagnosis not present

## 2023-09-20 DIAGNOSIS — Z5111 Encounter for antineoplastic chemotherapy: Secondary | ICD-10-CM | POA: Diagnosis not present

## 2023-09-20 DIAGNOSIS — J984 Other disorders of lung: Secondary | ICD-10-CM | POA: Diagnosis not present

## 2023-09-20 DIAGNOSIS — J9601 Acute respiratory failure with hypoxia: Secondary | ICD-10-CM | POA: Diagnosis not present

## 2023-09-20 DIAGNOSIS — R768 Other specified abnormal immunological findings in serum: Secondary | ICD-10-CM | POA: Diagnosis not present

## 2023-09-20 DIAGNOSIS — R634 Abnormal weight loss: Secondary | ICD-10-CM | POA: Diagnosis not present

## 2023-09-20 DIAGNOSIS — R140 Abdominal distension (gaseous): Secondary | ICD-10-CM | POA: Diagnosis not present

## 2023-09-20 DIAGNOSIS — D649 Anemia, unspecified: Secondary | ICD-10-CM | POA: Diagnosis not present

## 2023-09-20 DIAGNOSIS — A419 Sepsis, unspecified organism: Secondary | ICD-10-CM | POA: Diagnosis not present

## 2023-09-20 DIAGNOSIS — N185 Chronic kidney disease, stage 5: Secondary | ICD-10-CM | POA: Diagnosis not present

## 2023-09-20 DIAGNOSIS — R4182 Altered mental status, unspecified: Secondary | ICD-10-CM | POA: Diagnosis not present

## 2023-09-20 DIAGNOSIS — Z4682 Encounter for fitting and adjustment of non-vascular catheter: Secondary | ICD-10-CM | POA: Diagnosis not present

## 2023-09-20 DIAGNOSIS — J811 Chronic pulmonary edema: Secondary | ICD-10-CM | POA: Diagnosis not present

## 2023-09-20 DIAGNOSIS — R410 Disorientation, unspecified: Secondary | ICD-10-CM | POA: Diagnosis not present

## 2023-09-20 DIAGNOSIS — G9340 Encephalopathy, unspecified: Secondary | ICD-10-CM | POA: Diagnosis not present

## 2023-09-20 DIAGNOSIS — E859 Amyloidosis, unspecified: Secondary | ICD-10-CM | POA: Diagnosis not present

## 2023-09-20 DIAGNOSIS — E850 Non-neuropathic heredofamilial amyloidosis: Secondary | ICD-10-CM | POA: Diagnosis not present

## 2023-09-20 DIAGNOSIS — Z992 Dependence on renal dialysis: Secondary | ICD-10-CM | POA: Diagnosis not present

## 2023-09-20 DIAGNOSIS — R451 Restlessness and agitation: Secondary | ICD-10-CM | POA: Diagnosis not present

## 2023-09-20 DIAGNOSIS — D696 Thrombocytopenia, unspecified: Secondary | ICD-10-CM | POA: Diagnosis not present

## 2023-09-20 DIAGNOSIS — R7989 Other specified abnormal findings of blood chemistry: Secondary | ICD-10-CM | POA: Diagnosis not present

## 2023-09-20 DIAGNOSIS — Q213 Tetralogy of Fallot: Secondary | ICD-10-CM | POA: Diagnosis not present

## 2023-09-20 DIAGNOSIS — R6521 Severe sepsis with septic shock: Secondary | ICD-10-CM | POA: Diagnosis not present

## 2023-09-20 DIAGNOSIS — F0390 Unspecified dementia without behavioral disturbance: Secondary | ICD-10-CM | POA: Diagnosis not present

## 2023-09-20 DIAGNOSIS — E8589 Other amyloidosis: Secondary | ICD-10-CM | POA: Diagnosis not present

## 2023-09-20 DIAGNOSIS — I129 Hypertensive chronic kidney disease with stage 1 through stage 4 chronic kidney disease, or unspecified chronic kidney disease: Secondary | ICD-10-CM | POA: Diagnosis not present

## 2023-09-20 DIAGNOSIS — G928 Other toxic encephalopathy: Secondary | ICD-10-CM | POA: Diagnosis not present

## 2023-09-20 DIAGNOSIS — N189 Chronic kidney disease, unspecified: Secondary | ICD-10-CM | POA: Diagnosis not present

## 2023-09-20 DIAGNOSIS — I959 Hypotension, unspecified: Secondary | ICD-10-CM | POA: Diagnosis not present

## 2023-09-20 DIAGNOSIS — J90 Pleural effusion, not elsewhere classified: Secondary | ICD-10-CM | POA: Diagnosis not present

## 2023-09-20 DIAGNOSIS — J690 Pneumonitis due to inhalation of food and vomit: Secondary | ICD-10-CM | POA: Diagnosis not present

## 2023-09-20 DIAGNOSIS — E8809 Other disorders of plasma-protein metabolism, not elsewhere classified: Secondary | ICD-10-CM | POA: Diagnosis not present

## 2023-09-20 DIAGNOSIS — Z9889 Other specified postprocedural states: Secondary | ICD-10-CM | POA: Diagnosis not present

## 2023-09-20 DIAGNOSIS — R918 Other nonspecific abnormal finding of lung field: Secondary | ICD-10-CM | POA: Diagnosis not present

## 2023-09-20 DIAGNOSIS — I4891 Unspecified atrial fibrillation: Secondary | ICD-10-CM | POA: Diagnosis not present

## 2023-09-20 DIAGNOSIS — R600 Localized edema: Secondary | ICD-10-CM | POA: Diagnosis not present

## 2023-09-20 DIAGNOSIS — E875 Hyperkalemia: Secondary | ICD-10-CM | POA: Diagnosis not present

## 2023-09-20 DIAGNOSIS — I081 Rheumatic disorders of both mitral and tricuspid valves: Secondary | ICD-10-CM | POA: Diagnosis not present

## 2023-09-20 DIAGNOSIS — R579 Shock, unspecified: Secondary | ICD-10-CM | POA: Diagnosis not present

## 2023-09-20 DIAGNOSIS — N058 Unspecified nephritic syndrome with other morphologic changes: Secondary | ICD-10-CM | POA: Diagnosis not present

## 2023-09-20 DIAGNOSIS — Z9981 Dependence on supplemental oxygen: Secondary | ICD-10-CM | POA: Diagnosis not present

## 2023-09-20 DIAGNOSIS — R809 Proteinuria, unspecified: Secondary | ICD-10-CM | POA: Diagnosis not present

## 2023-09-20 DIAGNOSIS — J810 Acute pulmonary edema: Secondary | ICD-10-CM | POA: Diagnosis not present

## 2023-09-20 DIAGNOSIS — N179 Acute kidney failure, unspecified: Secondary | ICD-10-CM | POA: Diagnosis not present

## 2023-09-20 DIAGNOSIS — G20A1 Parkinson's disease without dyskinesia, without mention of fluctuations: Secondary | ICD-10-CM | POA: Diagnosis not present

## 2023-09-20 DIAGNOSIS — D472 Monoclonal gammopathy: Secondary | ICD-10-CM | POA: Diagnosis not present

## 2023-09-20 DIAGNOSIS — C9000 Multiple myeloma not having achieved remission: Secondary | ICD-10-CM | POA: Diagnosis not present

## 2023-09-20 DIAGNOSIS — J189 Pneumonia, unspecified organism: Secondary | ICD-10-CM | POA: Diagnosis not present

## 2023-09-20 DIAGNOSIS — I447 Left bundle-branch block, unspecified: Secondary | ICD-10-CM | POA: Diagnosis not present

## 2023-09-20 DIAGNOSIS — I421 Obstructive hypertrophic cardiomyopathy: Secondary | ICD-10-CM | POA: Diagnosis not present

## 2023-09-20 DIAGNOSIS — I480 Paroxysmal atrial fibrillation: Secondary | ICD-10-CM | POA: Diagnosis not present

## 2023-09-20 DIAGNOSIS — Z452 Encounter for adjustment and management of vascular access device: Secondary | ICD-10-CM | POA: Diagnosis not present

## 2023-09-20 DIAGNOSIS — R4181 Age-related cognitive decline: Secondary | ICD-10-CM | POA: Diagnosis not present

## 2023-09-20 DIAGNOSIS — K6389 Other specified diseases of intestine: Secondary | ICD-10-CM | POA: Diagnosis not present

## 2023-09-20 DIAGNOSIS — I132 Hypertensive heart and chronic kidney disease with heart failure and with stage 5 chronic kidney disease, or end stage renal disease: Secondary | ICD-10-CM | POA: Diagnosis not present

## 2023-09-20 LAB — COMPREHENSIVE METABOLIC PANEL
ALT: 85 U/L — ABNORMAL HIGH (ref 0–44)
AST: 76 U/L — ABNORMAL HIGH (ref 15–41)
Albumin: 3 g/dL — ABNORMAL LOW (ref 3.5–5.0)
Alkaline Phosphatase: 66 U/L (ref 38–126)
Anion gap: 14 (ref 5–15)
BUN: 63 mg/dL — ABNORMAL HIGH (ref 8–23)
CO2: 22 mmol/L (ref 22–32)
Calcium: 8.9 mg/dL (ref 8.9–10.3)
Chloride: 98 mmol/L (ref 98–111)
Creatinine, Ser: 6.62 mg/dL — ABNORMAL HIGH (ref 0.61–1.24)
GFR, Estimated: 8 mL/min — ABNORMAL LOW (ref >60)
Glucose, Bld: 128 mg/dL — ABNORMAL HIGH (ref 70–99)
Potassium: 4 mmol/L (ref 3.5–5.1)
Sodium: 134 mmol/L — ABNORMAL LOW (ref 135–145)
Total Bilirubin: 0.7 mg/dL (ref <1.2)
Total Protein: 6.3 g/dL — ABNORMAL LOW (ref 6.5–8.1)

## 2023-09-20 LAB — RENAL FUNCTION PANEL
Albumin: 3.1 g/dL — ABNORMAL LOW (ref 3.5–5.0)
Anion gap: 17 — ABNORMAL HIGH (ref 5–15)
BUN: 63 mg/dL — ABNORMAL HIGH (ref 8–23)
CO2: 21 mmol/L — ABNORMAL LOW (ref 22–32)
Calcium: 9 mg/dL (ref 8.9–10.3)
Chloride: 97 mmol/L — ABNORMAL LOW (ref 98–111)
Creatinine, Ser: 6.82 mg/dL — ABNORMAL HIGH (ref 0.61–1.24)
GFR, Estimated: 8 mL/min — ABNORMAL LOW (ref >60)
Glucose, Bld: 111 mg/dL — ABNORMAL HIGH (ref 70–99)
Phosphorus: 5.6 mg/dL — ABNORMAL HIGH (ref 2.5–4.6)
Potassium: 4.3 mmol/L (ref 3.5–5.1)
Sodium: 135 mmol/L (ref 135–145)

## 2023-09-20 LAB — CBC WITH DIFFERENTIAL/PLATELET
Abs Immature Granulocytes: 0.35 10*3/uL — ABNORMAL HIGH (ref 0.00–0.07)
Basophils Absolute: 0 10*3/uL (ref 0.0–0.1)
Basophils Relative: 0 %
Eosinophils Absolute: 0 10*3/uL (ref 0.0–0.5)
Eosinophils Relative: 0 %
HCT: 23.5 % — ABNORMAL LOW (ref 39.0–52.0)
Hemoglobin: 7.9 g/dL — ABNORMAL LOW (ref 13.0–17.0)
Immature Granulocytes: 3 %
Lymphocytes Relative: 5 %
Lymphs Abs: 0.6 10*3/uL — ABNORMAL LOW (ref 0.7–4.0)
MCH: 32.9 pg (ref 26.0–34.0)
MCHC: 33.6 g/dL (ref 30.0–36.0)
MCV: 97.9 fL (ref 80.0–100.0)
Monocytes Absolute: 1.2 10*3/uL — ABNORMAL HIGH (ref 0.1–1.0)
Monocytes Relative: 9 %
Neutro Abs: 10.3 10*3/uL — ABNORMAL HIGH (ref 1.7–7.7)
Neutrophils Relative %: 83 %
Platelets: 209 10*3/uL (ref 150–400)
RBC: 2.4 MIL/uL — ABNORMAL LOW (ref 4.22–5.81)
RDW: 13.2 % (ref 11.5–15.5)
WBC: 12.5 10*3/uL — ABNORMAL HIGH (ref 4.0–10.5)
nRBC: 0 % (ref 0.0–0.2)

## 2023-09-20 LAB — BLOOD GAS, ARTERIAL
Acid-Base Excess: 1.7 mmol/L (ref 0.0–2.0)
Bicarbonate: 25.7 mmol/L (ref 20.0–28.0)
Drawn by: 38235
FIO2: 44 %
O2 Saturation: 94.8 %
Patient temperature: 37
pCO2 arterial: 37 mm[Hg] (ref 32–48)
pH, Arterial: 7.45 (ref 7.35–7.45)
pO2, Arterial: 67 mm[Hg] — ABNORMAL LOW (ref 83–108)

## 2023-09-20 LAB — PROTIME-INR
INR: 1.3 — ABNORMAL HIGH (ref 0.8–1.2)
Prothrombin Time: 16.1 s — ABNORMAL HIGH (ref 11.4–15.2)

## 2023-09-20 LAB — GLUCOSE, CAPILLARY
Glucose-Capillary: 102 mg/dL — ABNORMAL HIGH (ref 70–99)
Glucose-Capillary: 122 mg/dL — ABNORMAL HIGH (ref 70–99)

## 2023-09-20 LAB — TROPONIN I (HIGH SENSITIVITY)
Troponin I (High Sensitivity): 2882 ng/L (ref <18)
Troponin I (High Sensitivity): 2374 ng/L (ref <18)
Troponin I (High Sensitivity): 2258 ng/L (ref <18)

## 2023-09-20 LAB — BRAIN NATRIURETIC PEPTIDE: B Natriuretic Peptide: 4112 pg/mL — ABNORMAL HIGH (ref 0.0–100.0)

## 2023-09-20 LAB — HEPARIN LEVEL (UNFRACTIONATED)
Heparin Unfractionated: 0.18 [IU]/mL — ABNORMAL LOW (ref 0.30–0.70)
Heparin Unfractionated: 0.49 [IU]/mL (ref 0.30–0.70)

## 2023-09-20 LAB — CBC
HCT: 23.6 % — ABNORMAL LOW (ref 39.0–52.0)
Hemoglobin: 8 g/dL — ABNORMAL LOW (ref 13.0–17.0)
MCH: 33.3 pg (ref 26.0–34.0)
MCHC: 33.9 g/dL (ref 30.0–36.0)
MCV: 98.3 fL (ref 80.0–100.0)
Platelets: 231 10*3/uL (ref 150–400)
RBC: 2.4 MIL/uL — ABNORMAL LOW (ref 4.22–5.81)
RDW: 13.2 % (ref 11.5–15.5)
WBC: 13.3 10*3/uL — ABNORMAL HIGH (ref 4.0–10.5)
nRBC: 0 % (ref 0.0–0.2)

## 2023-09-20 LAB — D-DIMER, QUANTITATIVE: D-Dimer, Quant: 1.82 ug{FEU}/mL — ABNORMAL HIGH (ref 0.00–0.50)

## 2023-09-20 LAB — LACTATE DEHYDROGENASE: LDH: 250 U/L — ABNORMAL HIGH (ref 98–192)

## 2023-09-20 LAB — LACTIC ACID, PLASMA: Lactic Acid, Venous: 1.9 mmol/L (ref 0.5–1.9)

## 2023-09-20 MED ORDER — MIDODRINE HCL 5 MG PO TABS
10.0000 mg | ORAL_TABLET | Freq: Three times a day (TID) | ORAL | Status: DC
  Administered 2023-09-20: 10 mg via ORAL
  Filled 2023-09-20: qty 2

## 2023-09-20 MED ORDER — FUROSEMIDE 10 MG/ML IJ SOLN
40.0000 mg | INTRAMUSCULAR | Status: AC
  Administered 2023-09-20: 40 mg via INTRAVENOUS
  Filled 2023-09-20: qty 4

## 2023-09-20 MED ORDER — MIDODRINE HCL 5 MG PO TABS
10.0000 mg | ORAL_TABLET | ORAL | Status: AC
  Administered 2023-09-20: 10 mg via ORAL
  Filled 2023-09-20: qty 2

## 2023-09-20 MED ORDER — ASPIRIN 325 MG PO TABS
325.0000 mg | ORAL_TABLET | Freq: Once | ORAL | Status: AC
  Administered 2023-09-20: 325 mg via ORAL
  Filled 2023-09-20: qty 1

## 2023-09-20 MED ORDER — SODIUM CHLORIDE 0.9 % IV SOLN
3.0000 g | Freq: Two times a day (BID) | INTRAVENOUS | Status: DC
  Administered 2023-09-20: 3 g via INTRAVENOUS
  Filled 2023-09-20 (×4): qty 8

## 2023-09-20 NOTE — Progress Notes (Signed)
RT's responded to Rapid Response call overhead. Upon arrival patient noted to have increased WOB and respiratory rate. O2 demands have gone up to 6 lpm. Patient is currently awaiting pickup for bed at [facility]. ABG drawn and taken to lab. No further RT orders given at this time.

## 2023-09-20 NOTE — Progress Notes (Signed)
The patient has been transferred to stepdown unit for closer monitoring.  Updated [facility].  The patient's bed level at [facility] has also been upgraded to stepdown unit.  Discussed the case with cardiology, Dr. Lendell Caprice, the patient will be seen in the morning by cardiology.  Continue heparin drip and current medical management.  Will continue to closely monitor and treat as indicated.

## 2023-09-20 NOTE — Progress Notes (Signed)
PHARMACY - ANTICOAGULATION CONSULT NOTE  Pharmacy Consult for Heparin Indication: atrial fibrillation  Allergies  Allergen Reactions   Digoxin And Related Other (See Comments)    Patient Measurements: Height: 5\' 9"  (175.3 cm) Weight: 81.7 kg (180 lb 1.9 oz) IBW/kg (Calculated) : 70.7 HEPARIN DW (KG): 79.4   Vital Signs: Temp: 98.1 F (36.7 C) (12/11 0728) Temp Source: Axillary (12/11 0728) BP: 91/54 (12/11 0732) Pulse Rate: 90 (12/11 0732)  Labs: Recent Labs    09/17/23 1854 09/18/23 0405 09/19/23 0438 09/20/23 0026 09/20/23 0240 09/20/23 0443  HGB  --    < > 8.2* 7.9* 8.0*  --   HCT  --    < > 23.8* 23.5* 23.6*  --   PLT  --    < > 220 209 231  --   LABPROT  --   --   --   --  16.1*  --   INR  --   --   --   --  1.3*  --   HEPARINUNFRC 0.40  --  0.19* 0.18*  --   --   CREATININE  --    < > 7.59* 6.62* 6.82*  --   TROPONINIHS  --   --   --  2,882* 2,374* 2,258*   < > = values in this interval not displayed.    Estimated Creatinine Clearance: 8.5 mL/min (A) (by C-G formula based on SCr of 6.82 mg/dL (H)).   Medical History: Past Medical History:  Diagnosis Date   Bradycardia    Diabetes mellitus without complication (HCC)    Hyperlipidemia    Hypertension    NUCLEAR STRESS TEST, 07/17/2007 - EKG negative ischemia   Memory loss    Murmur    2D ECHO, 02/04/2009 - EF 55-65%, normal   TIA (transient ischemic attack)     Medications:   Assessment: [facility] is an 82 yo male who presented to [facility] ED on 09/06/2023 for evaluation of fatigue and weakness. Was found to have an AKI with creatinine elevated at 7.82 . H/o HTN, HLD, Parkinsons, , prior TIA and bradycardia. Patient with new onset afib. Pharmacy asked to dose heparin. Patient to be evaluated by [facility] for urgent start PD which is n/a at [facility]. Not on any oral anticoagulants PTA.  Heparin level now at goal on 1600 units/hr. No bleeding or IV issues noted. Hemoglobin 8, plt 231.  Rapid response  called overnight, troponins bumped and plan is for transfer to [facility] this morning.   Goal of Therapy:  Heparin level 0.3-0.7 units/ml Monitor platelets by anticoagulation protocol: Yes   Plan:  Continue heparin at 1600/hr Recheck heparin level this evening  Thank you for involving pharmacy in this patient's care.   Sheppard Coil PharmD., BCPS Clinical Pharmacist 09/20/2023 8:00 AM

## 2023-09-20 NOTE — Progress Notes (Addendum)
Received a call from bedside RN regarding the patient having increased work of breathing and increasing oxygen requirement.  Presented at bedside, the patient is tachypneic with respiration rate as high as 30.  His eyes are closed however he is able to follow commands.  Diffuse wheezing and rales noted on lungs auscultation.  Chest x-ray was done showing increased pulmonary vascularity and pulmonary infiltrates involving right lung and left lower lung.  Started Unasyn due to concern for possible aspiration pneumonia.  Speech therapist consulted for dysphagia.  Per the patient's wife at bedside, she has suspected that he may have issues with swallowing.  Lab work revealed elevated troponin with high delta, 2882 from 424 on heparin drip.  Full dose aspirin 325 mg x 1, twelve-lead EKG, and serial troponin ordered.  Due to altered mental status, stat CT head ordered to rule out intracranial hemorrhage.  Midodrine 10 mg x 2 and IV Lasix 40 mg x 1 administered.  [facility] was called to provide updates on the patient's transfer.  DC delayed due to ongoing workup for suspected NSTEMI.  Addendum:  Limited 2D echo ordered and cardiology on-call Dr. Lendell Caprice (At [facility]) re-consulted.   Critical care time: 30 minutes.

## 2023-09-20 NOTE — Progress Notes (Signed)
Patient transferred to ICU for further monitoring.  MD notified of increased BNP and troponin.

## 2023-09-20 NOTE — Progress Notes (Signed)
Rapid response called due to patient have increased respirations.   Respirations increased to the 30s, and oxygen increased to 6L high. Patient has shown agitation through shift.  Urinary output has been 200cc since beginning of shift at 1900.  Patient vitals are slightly below normal limits but stable. Family is present. Rapid response called, respiratory, AC ICU RN and MD to beside.

## 2023-09-20 NOTE — Progress Notes (Signed)
PHARMACY - ANTICOAGULATION Pharmacy Consult for Heparin Indication: atrial fibrillation/ACS Brief A/P: Heparin level subtherapeutic Increase Heparin rate  Allergies  Allergen Reactions   Digoxin And Related Other (See Comments)    Patient Measurements: Height: 5\' 9"  (175.3 cm) Weight: 83.9 kg (184 lb 15.5 oz) IBW/kg (Calculated) : 70.7 HEPARIN DW (KG): 79.4   Vital Signs: Temp: 98.1 F (36.7 C) (12/10 2342) Temp Source: Oral (12/10 2342) BP: 99/66 (12/10 2342) Pulse Rate: 97 (12/10 2342)  Labs: Recent Labs    09/17/23 1854 09/18/23 0405 09/19/23 0438 09/20/23 0026  HGB  --  8.6* 8.2* 7.9*  HCT  --  25.4* 23.8* 23.5*  PLT  --  228 220 209  HEPARINUNFRC 0.40  --  0.19* 0.18*  CREATININE  --  8.93* 7.59* 6.62*  TROPONINIHS  --   --   --  2,882*    Estimated Creatinine Clearance: 8.8 mL/min (A) (by C-G formula based on SCr of 6.62 mg/dL (H)).   Assessment: 82 y.o. male with Afib and elevated troponin for heparin  Goal of Therapy:  Heparin level 0.3-0.7 units/ml Monitor platelets by anticoagulation protocol: Yes   Plan:  Increase Heparin 1600 units/hr Check heparin level in 8 hours.   Geannie Risen, PharmD, BCPS  09/20/2023 2:17 AM

## 2023-09-20 NOTE — Progress Notes (Addendum)
   IR Note  Pt was scheduled for Bone marrow Bx in IR today Rapid response overnight; high troponins; on Hep drip and ASA 325 po He was moved to ICU overnight  Hypotension; increased O2 requirement-- 5L and satting in 90s IR procedure canceled  Pt to be Transferred to [facility] for Renal Care

## 2023-09-20 NOTE — Progress Notes (Addendum)
Patient to transport to [facility]. Bed: MPCU 3314 [phone]  Receiving Dr. Thomes Cake  [facility] called for transport.

## 2023-09-21 DIAGNOSIS — I4891 Unspecified atrial fibrillation: Secondary | ICD-10-CM | POA: Diagnosis not present

## 2023-09-21 DIAGNOSIS — N189 Chronic kidney disease, unspecified: Secondary | ICD-10-CM | POA: Diagnosis not present

## 2023-09-21 DIAGNOSIS — G20A1 Parkinson's disease without dyskinesia, without mention of fluctuations: Secondary | ICD-10-CM | POA: Diagnosis not present

## 2023-09-21 DIAGNOSIS — N179 Acute kidney failure, unspecified: Secondary | ICD-10-CM | POA: Diagnosis not present

## 2023-09-21 DIAGNOSIS — J9601 Acute respiratory failure with hypoxia: Secondary | ICD-10-CM | POA: Diagnosis not present

## 2023-09-21 DIAGNOSIS — J811 Chronic pulmonary edema: Secondary | ICD-10-CM | POA: Diagnosis not present

## 2023-09-21 DIAGNOSIS — R809 Proteinuria, unspecified: Secondary | ICD-10-CM | POA: Diagnosis not present

## 2023-09-21 DIAGNOSIS — C9000 Multiple myeloma not having achieved remission: Secondary | ICD-10-CM | POA: Diagnosis not present

## 2023-09-21 DIAGNOSIS — I129 Hypertensive chronic kidney disease with stage 1 through stage 4 chronic kidney disease, or unspecified chronic kidney disease: Secondary | ICD-10-CM | POA: Diagnosis not present

## 2023-09-21 DIAGNOSIS — R451 Restlessness and agitation: Secondary | ICD-10-CM | POA: Diagnosis not present

## 2023-09-21 DIAGNOSIS — Z452 Encounter for adjustment and management of vascular access device: Secondary | ICD-10-CM | POA: Diagnosis not present

## 2023-09-21 DIAGNOSIS — R918 Other nonspecific abnormal finding of lung field: Secondary | ICD-10-CM | POA: Diagnosis not present

## 2023-09-21 DIAGNOSIS — I447 Left bundle-branch block, unspecified: Secondary | ICD-10-CM | POA: Diagnosis not present

## 2023-09-21 DIAGNOSIS — Z4682 Encounter for fitting and adjustment of non-vascular catheter: Secondary | ICD-10-CM | POA: Diagnosis not present

## 2023-09-21 DIAGNOSIS — G9340 Encephalopathy, unspecified: Secondary | ICD-10-CM | POA: Diagnosis not present

## 2023-09-21 LAB — CULTURE, BLOOD (ROUTINE X 2)
Special Requests: ADEQUATE
Special Requests: ADEQUATE

## 2023-09-21 LAB — BETA 2 MICROGLOBULIN, SERUM: Beta-2 Microglobulin: 14.9 mg/L — ABNORMAL HIGH (ref 0.6–2.4)

## 2023-09-22 DIAGNOSIS — J811 Chronic pulmonary edema: Secondary | ICD-10-CM | POA: Diagnosis not present

## 2023-09-22 DIAGNOSIS — Z5111 Encounter for antineoplastic chemotherapy: Secondary | ICD-10-CM | POA: Diagnosis not present

## 2023-09-22 DIAGNOSIS — R768 Other specified abnormal immunological findings in serum: Secondary | ICD-10-CM | POA: Diagnosis not present

## 2023-09-22 DIAGNOSIS — R451 Restlessness and agitation: Secondary | ICD-10-CM | POA: Diagnosis not present

## 2023-09-22 DIAGNOSIS — N179 Acute kidney failure, unspecified: Secondary | ICD-10-CM | POA: Diagnosis not present

## 2023-09-22 DIAGNOSIS — C9000 Multiple myeloma not having achieved remission: Secondary | ICD-10-CM | POA: Diagnosis not present

## 2023-09-22 DIAGNOSIS — E859 Amyloidosis, unspecified: Secondary | ICD-10-CM | POA: Diagnosis not present

## 2023-09-22 DIAGNOSIS — I4891 Unspecified atrial fibrillation: Secondary | ICD-10-CM | POA: Diagnosis not present

## 2023-09-22 DIAGNOSIS — I081 Rheumatic disorders of both mitral and tricuspid valves: Secondary | ICD-10-CM | POA: Diagnosis not present

## 2023-09-22 DIAGNOSIS — Q213 Tetralogy of Fallot: Secondary | ICD-10-CM | POA: Diagnosis not present

## 2023-09-22 DIAGNOSIS — G20A1 Parkinson's disease without dyskinesia, without mention of fluctuations: Secondary | ICD-10-CM | POA: Diagnosis not present

## 2023-09-22 DIAGNOSIS — J9601 Acute respiratory failure with hypoxia: Secondary | ICD-10-CM | POA: Diagnosis not present

## 2023-09-22 DIAGNOSIS — Z4682 Encounter for fitting and adjustment of non-vascular catheter: Secondary | ICD-10-CM | POA: Diagnosis not present

## 2023-09-22 DIAGNOSIS — I129 Hypertensive chronic kidney disease with stage 1 through stage 4 chronic kidney disease, or unspecified chronic kidney disease: Secondary | ICD-10-CM | POA: Diagnosis not present

## 2023-09-22 DIAGNOSIS — Z452 Encounter for adjustment and management of vascular access device: Secondary | ICD-10-CM | POA: Diagnosis not present

## 2023-09-22 LAB — IMMUNOFIXATION, URINE

## 2023-09-23 DIAGNOSIS — R600 Localized edema: Secondary | ICD-10-CM | POA: Diagnosis not present

## 2023-09-23 DIAGNOSIS — G20A1 Parkinson's disease without dyskinesia, without mention of fluctuations: Secondary | ICD-10-CM | POA: Diagnosis not present

## 2023-09-23 DIAGNOSIS — N179 Acute kidney failure, unspecified: Secondary | ICD-10-CM | POA: Diagnosis not present

## 2023-09-23 DIAGNOSIS — I4891 Unspecified atrial fibrillation: Secondary | ICD-10-CM | POA: Diagnosis not present

## 2023-09-23 DIAGNOSIS — I129 Hypertensive chronic kidney disease with stage 1 through stage 4 chronic kidney disease, or unspecified chronic kidney disease: Secondary | ICD-10-CM | POA: Diagnosis not present

## 2023-09-23 DIAGNOSIS — J189 Pneumonia, unspecified organism: Secondary | ICD-10-CM | POA: Diagnosis not present

## 2023-09-23 DIAGNOSIS — N189 Chronic kidney disease, unspecified: Secondary | ICD-10-CM | POA: Diagnosis not present

## 2023-09-23 DIAGNOSIS — C9000 Multiple myeloma not having achieved remission: Secondary | ICD-10-CM | POA: Diagnosis not present

## 2023-09-23 DIAGNOSIS — J9601 Acute respiratory failure with hypoxia: Secondary | ICD-10-CM | POA: Diagnosis not present

## 2023-09-24 DIAGNOSIS — F0390 Unspecified dementia without behavioral disturbance: Secondary | ICD-10-CM | POA: Diagnosis not present

## 2023-09-24 DIAGNOSIS — J9601 Acute respiratory failure with hypoxia: Secondary | ICD-10-CM | POA: Diagnosis not present

## 2023-09-24 DIAGNOSIS — N179 Acute kidney failure, unspecified: Secondary | ICD-10-CM | POA: Diagnosis not present

## 2023-09-24 DIAGNOSIS — G20A1 Parkinson's disease without dyskinesia, without mention of fluctuations: Secondary | ICD-10-CM | POA: Diagnosis not present

## 2023-09-24 DIAGNOSIS — E859 Amyloidosis, unspecified: Secondary | ICD-10-CM | POA: Diagnosis not present

## 2023-09-24 DIAGNOSIS — R634 Abnormal weight loss: Secondary | ICD-10-CM | POA: Diagnosis not present

## 2023-09-24 DIAGNOSIS — R451 Restlessness and agitation: Secondary | ICD-10-CM | POA: Diagnosis not present

## 2023-09-24 DIAGNOSIS — J810 Acute pulmonary edema: Secondary | ICD-10-CM | POA: Diagnosis not present

## 2023-09-24 DIAGNOSIS — I480 Paroxysmal atrial fibrillation: Secondary | ICD-10-CM | POA: Diagnosis not present

## 2023-09-25 DIAGNOSIS — J9601 Acute respiratory failure with hypoxia: Secondary | ICD-10-CM | POA: Diagnosis not present

## 2023-09-25 DIAGNOSIS — I4891 Unspecified atrial fibrillation: Secondary | ICD-10-CM | POA: Diagnosis not present

## 2023-09-25 DIAGNOSIS — E859 Amyloidosis, unspecified: Secondary | ICD-10-CM | POA: Diagnosis not present

## 2023-09-25 DIAGNOSIS — I959 Hypotension, unspecified: Secondary | ICD-10-CM | POA: Diagnosis not present

## 2023-09-25 DIAGNOSIS — J811 Chronic pulmonary edema: Secondary | ICD-10-CM | POA: Diagnosis not present

## 2023-09-25 DIAGNOSIS — N179 Acute kidney failure, unspecified: Secondary | ICD-10-CM | POA: Diagnosis not present

## 2023-09-25 DIAGNOSIS — I129 Hypertensive chronic kidney disease with stage 1 through stage 4 chronic kidney disease, or unspecified chronic kidney disease: Secondary | ICD-10-CM | POA: Diagnosis not present

## 2023-09-25 DIAGNOSIS — G20A1 Parkinson's disease without dyskinesia, without mention of fluctuations: Secondary | ICD-10-CM | POA: Diagnosis not present

## 2023-09-25 DIAGNOSIS — N189 Chronic kidney disease, unspecified: Secondary | ICD-10-CM | POA: Diagnosis not present

## 2023-09-25 DIAGNOSIS — C9000 Multiple myeloma not having achieved remission: Secondary | ICD-10-CM | POA: Diagnosis not present

## 2023-09-25 DIAGNOSIS — J984 Other disorders of lung: Secondary | ICD-10-CM | POA: Diagnosis not present

## 2023-09-25 DIAGNOSIS — R918 Other nonspecific abnormal finding of lung field: Secondary | ICD-10-CM | POA: Diagnosis not present

## 2023-09-25 DIAGNOSIS — Z5111 Encounter for antineoplastic chemotherapy: Secondary | ICD-10-CM | POA: Diagnosis not present

## 2023-09-25 LAB — IMMUNOFIXATION ELECTROPHORESIS
IgA: 1159 mg/dL — ABNORMAL HIGH (ref 61–437)
IgG (Immunoglobin G), Serum: 469 mg/dL — ABNORMAL LOW (ref 603–1613)
IgM (Immunoglobulin M), Srm: 29 mg/dL (ref 15–143)
Total Protein ELP: 6.7 g/dL (ref 6.0–8.5)

## 2023-09-26 DIAGNOSIS — I129 Hypertensive chronic kidney disease with stage 1 through stage 4 chronic kidney disease, or unspecified chronic kidney disease: Secondary | ICD-10-CM | POA: Diagnosis not present

## 2023-09-26 DIAGNOSIS — I421 Obstructive hypertrophic cardiomyopathy: Secondary | ICD-10-CM | POA: Diagnosis not present

## 2023-09-26 DIAGNOSIS — N179 Acute kidney failure, unspecified: Secondary | ICD-10-CM | POA: Diagnosis not present

## 2023-09-26 DIAGNOSIS — J811 Chronic pulmonary edema: Secondary | ICD-10-CM | POA: Diagnosis not present

## 2023-09-26 DIAGNOSIS — J9601 Acute respiratory failure with hypoxia: Secondary | ICD-10-CM | POA: Diagnosis not present

## 2023-09-26 DIAGNOSIS — I4891 Unspecified atrial fibrillation: Secondary | ICD-10-CM | POA: Diagnosis not present

## 2023-09-26 DIAGNOSIS — E850 Non-neuropathic heredofamilial amyloidosis: Secondary | ICD-10-CM | POA: Diagnosis not present

## 2023-09-26 DIAGNOSIS — I959 Hypotension, unspecified: Secondary | ICD-10-CM | POA: Diagnosis not present

## 2023-09-26 DIAGNOSIS — N189 Chronic kidney disease, unspecified: Secondary | ICD-10-CM | POA: Diagnosis not present

## 2023-09-27 DIAGNOSIS — G20A1 Parkinson's disease without dyskinesia, without mention of fluctuations: Secondary | ICD-10-CM | POA: Diagnosis not present

## 2023-09-27 DIAGNOSIS — Q213 Tetralogy of Fallot: Secondary | ICD-10-CM | POA: Diagnosis not present

## 2023-09-27 DIAGNOSIS — N179 Acute kidney failure, unspecified: Secondary | ICD-10-CM | POA: Diagnosis not present

## 2023-09-27 DIAGNOSIS — J811 Chronic pulmonary edema: Secondary | ICD-10-CM | POA: Diagnosis not present

## 2023-09-27 DIAGNOSIS — D649 Anemia, unspecified: Secondary | ICD-10-CM | POA: Diagnosis not present

## 2023-09-27 DIAGNOSIS — E859 Amyloidosis, unspecified: Secondary | ICD-10-CM | POA: Diagnosis not present

## 2023-09-27 DIAGNOSIS — I959 Hypotension, unspecified: Secondary | ICD-10-CM | POA: Diagnosis not present

## 2023-09-27 DIAGNOSIS — D696 Thrombocytopenia, unspecified: Secondary | ICD-10-CM | POA: Diagnosis not present

## 2023-09-27 DIAGNOSIS — I4891 Unspecified atrial fibrillation: Secondary | ICD-10-CM | POA: Diagnosis not present

## 2023-09-27 DIAGNOSIS — J9601 Acute respiratory failure with hypoxia: Secondary | ICD-10-CM | POA: Diagnosis not present

## 2023-09-27 DIAGNOSIS — R451 Restlessness and agitation: Secondary | ICD-10-CM | POA: Diagnosis not present

## 2023-09-28 DIAGNOSIS — N179 Acute kidney failure, unspecified: Secondary | ICD-10-CM | POA: Diagnosis not present

## 2023-09-28 DIAGNOSIS — D472 Monoclonal gammopathy: Secondary | ICD-10-CM | POA: Diagnosis not present

## 2023-09-28 DIAGNOSIS — R451 Restlessness and agitation: Secondary | ICD-10-CM | POA: Diagnosis not present

## 2023-09-28 DIAGNOSIS — I4891 Unspecified atrial fibrillation: Secondary | ICD-10-CM | POA: Diagnosis not present

## 2023-09-28 DIAGNOSIS — N189 Chronic kidney disease, unspecified: Secondary | ICD-10-CM | POA: Diagnosis not present

## 2023-09-28 DIAGNOSIS — I129 Hypertensive chronic kidney disease with stage 1 through stage 4 chronic kidney disease, or unspecified chronic kidney disease: Secondary | ICD-10-CM | POA: Diagnosis not present

## 2023-09-28 DIAGNOSIS — J811 Chronic pulmonary edema: Secondary | ICD-10-CM | POA: Diagnosis not present

## 2023-09-28 DIAGNOSIS — G20A1 Parkinson's disease without dyskinesia, without mention of fluctuations: Secondary | ICD-10-CM | POA: Diagnosis not present

## 2023-09-28 DIAGNOSIS — J9601 Acute respiratory failure with hypoxia: Secondary | ICD-10-CM | POA: Diagnosis not present

## 2023-09-29 DIAGNOSIS — I4891 Unspecified atrial fibrillation: Secondary | ICD-10-CM | POA: Diagnosis not present

## 2023-09-29 DIAGNOSIS — Z5111 Encounter for antineoplastic chemotherapy: Secondary | ICD-10-CM | POA: Diagnosis not present

## 2023-09-29 DIAGNOSIS — Z452 Encounter for adjustment and management of vascular access device: Secondary | ICD-10-CM | POA: Diagnosis not present

## 2023-09-29 DIAGNOSIS — C9000 Multiple myeloma not having achieved remission: Secondary | ICD-10-CM | POA: Diagnosis not present

## 2023-09-29 DIAGNOSIS — D649 Anemia, unspecified: Secondary | ICD-10-CM | POA: Diagnosis not present

## 2023-09-29 DIAGNOSIS — Z4682 Encounter for fitting and adjustment of non-vascular catheter: Secondary | ICD-10-CM | POA: Diagnosis not present

## 2023-09-29 DIAGNOSIS — J9601 Acute respiratory failure with hypoxia: Secondary | ICD-10-CM | POA: Diagnosis not present

## 2023-09-29 DIAGNOSIS — Q213 Tetralogy of Fallot: Secondary | ICD-10-CM | POA: Diagnosis not present

## 2023-09-29 DIAGNOSIS — R451 Restlessness and agitation: Secondary | ICD-10-CM | POA: Diagnosis not present

## 2023-09-29 DIAGNOSIS — I959 Hypotension, unspecified: Secondary | ICD-10-CM | POA: Diagnosis not present

## 2023-09-29 DIAGNOSIS — G20A1 Parkinson's disease without dyskinesia, without mention of fluctuations: Secondary | ICD-10-CM | POA: Diagnosis not present

## 2023-09-29 DIAGNOSIS — I129 Hypertensive chronic kidney disease with stage 1 through stage 4 chronic kidney disease, or unspecified chronic kidney disease: Secondary | ICD-10-CM | POA: Diagnosis not present

## 2023-09-29 DIAGNOSIS — E859 Amyloidosis, unspecified: Secondary | ICD-10-CM | POA: Diagnosis not present

## 2023-09-29 DIAGNOSIS — J811 Chronic pulmonary edema: Secondary | ICD-10-CM | POA: Diagnosis not present

## 2023-09-29 DIAGNOSIS — N179 Acute kidney failure, unspecified: Secondary | ICD-10-CM | POA: Diagnosis not present

## 2023-09-30 DIAGNOSIS — R600 Localized edema: Secondary | ICD-10-CM | POA: Diagnosis not present

## 2023-09-30 DIAGNOSIS — N189 Chronic kidney disease, unspecified: Secondary | ICD-10-CM | POA: Diagnosis not present

## 2023-09-30 DIAGNOSIS — J189 Pneumonia, unspecified organism: Secondary | ICD-10-CM | POA: Diagnosis not present

## 2023-09-30 DIAGNOSIS — J9601 Acute respiratory failure with hypoxia: Secondary | ICD-10-CM | POA: Diagnosis not present

## 2023-09-30 DIAGNOSIS — Z5111 Encounter for antineoplastic chemotherapy: Secondary | ICD-10-CM | POA: Diagnosis not present

## 2023-09-30 DIAGNOSIS — E850 Non-neuropathic heredofamilial amyloidosis: Secondary | ICD-10-CM | POA: Diagnosis not present

## 2023-09-30 DIAGNOSIS — I129 Hypertensive chronic kidney disease with stage 1 through stage 4 chronic kidney disease, or unspecified chronic kidney disease: Secondary | ICD-10-CM | POA: Diagnosis not present

## 2023-09-30 DIAGNOSIS — N179 Acute kidney failure, unspecified: Secondary | ICD-10-CM | POA: Diagnosis not present

## 2023-09-30 DIAGNOSIS — I4891 Unspecified atrial fibrillation: Secondary | ICD-10-CM | POA: Diagnosis not present

## 2023-09-30 DIAGNOSIS — C9000 Multiple myeloma not having achieved remission: Secondary | ICD-10-CM | POA: Diagnosis not present

## 2023-09-30 DIAGNOSIS — G20A1 Parkinson's disease without dyskinesia, without mention of fluctuations: Secondary | ICD-10-CM | POA: Diagnosis not present

## 2023-10-01 DIAGNOSIS — Z5111 Encounter for antineoplastic chemotherapy: Secondary | ICD-10-CM | POA: Diagnosis not present

## 2023-10-01 DIAGNOSIS — I4891 Unspecified atrial fibrillation: Secondary | ICD-10-CM | POA: Diagnosis not present

## 2023-10-01 DIAGNOSIS — N179 Acute kidney failure, unspecified: Secondary | ICD-10-CM | POA: Diagnosis not present

## 2023-10-01 DIAGNOSIS — I959 Hypotension, unspecified: Secondary | ICD-10-CM | POA: Diagnosis not present

## 2023-10-01 DIAGNOSIS — J9601 Acute respiratory failure with hypoxia: Secondary | ICD-10-CM | POA: Diagnosis not present

## 2023-10-01 DIAGNOSIS — C9000 Multiple myeloma not having achieved remission: Secondary | ICD-10-CM | POA: Diagnosis not present

## 2023-10-01 DIAGNOSIS — R600 Localized edema: Secondary | ICD-10-CM | POA: Diagnosis not present

## 2023-10-01 DIAGNOSIS — G20A1 Parkinson's disease without dyskinesia, without mention of fluctuations: Secondary | ICD-10-CM | POA: Diagnosis not present

## 2023-10-01 DIAGNOSIS — Z9889 Other specified postprocedural states: Secondary | ICD-10-CM | POA: Diagnosis not present

## 2023-10-01 DIAGNOSIS — N189 Chronic kidney disease, unspecified: Secondary | ICD-10-CM | POA: Diagnosis not present

## 2023-10-01 DIAGNOSIS — E850 Non-neuropathic heredofamilial amyloidosis: Secondary | ICD-10-CM | POA: Diagnosis not present

## 2023-10-02 DIAGNOSIS — I4891 Unspecified atrial fibrillation: Secondary | ICD-10-CM | POA: Diagnosis not present

## 2023-10-02 DIAGNOSIS — G20A1 Parkinson's disease without dyskinesia, without mention of fluctuations: Secondary | ICD-10-CM | POA: Diagnosis not present

## 2023-10-02 DIAGNOSIS — R4181 Age-related cognitive decline: Secondary | ICD-10-CM | POA: Diagnosis not present

## 2023-10-02 DIAGNOSIS — I959 Hypotension, unspecified: Secondary | ICD-10-CM | POA: Diagnosis not present

## 2023-10-02 DIAGNOSIS — J9601 Acute respiratory failure with hypoxia: Secondary | ICD-10-CM | POA: Diagnosis not present

## 2023-10-02 DIAGNOSIS — Z5111 Encounter for antineoplastic chemotherapy: Secondary | ICD-10-CM | POA: Diagnosis not present

## 2023-10-02 DIAGNOSIS — E859 Amyloidosis, unspecified: Secondary | ICD-10-CM | POA: Diagnosis not present

## 2023-10-02 DIAGNOSIS — N179 Acute kidney failure, unspecified: Secondary | ICD-10-CM | POA: Diagnosis not present

## 2023-10-02 DIAGNOSIS — C9000 Multiple myeloma not having achieved remission: Secondary | ICD-10-CM | POA: Diagnosis not present

## 2023-10-02 DIAGNOSIS — N189 Chronic kidney disease, unspecified: Secondary | ICD-10-CM | POA: Diagnosis not present

## 2023-10-02 DIAGNOSIS — I129 Hypertensive chronic kidney disease with stage 1 through stage 4 chronic kidney disease, or unspecified chronic kidney disease: Secondary | ICD-10-CM | POA: Diagnosis not present

## 2023-10-02 DIAGNOSIS — J811 Chronic pulmonary edema: Secondary | ICD-10-CM | POA: Diagnosis not present

## 2023-10-03 DIAGNOSIS — Z4682 Encounter for fitting and adjustment of non-vascular catheter: Secondary | ICD-10-CM | POA: Diagnosis not present

## 2023-10-03 DIAGNOSIS — N179 Acute kidney failure, unspecified: Secondary | ICD-10-CM | POA: Diagnosis not present

## 2023-10-03 DIAGNOSIS — R600 Localized edema: Secondary | ICD-10-CM | POA: Diagnosis not present

## 2023-10-03 DIAGNOSIS — K6389 Other specified diseases of intestine: Secondary | ICD-10-CM | POA: Diagnosis not present

## 2023-10-03 DIAGNOSIS — I4891 Unspecified atrial fibrillation: Secondary | ICD-10-CM | POA: Diagnosis not present

## 2023-10-03 DIAGNOSIS — N189 Chronic kidney disease, unspecified: Secondary | ICD-10-CM | POA: Diagnosis not present

## 2023-10-03 DIAGNOSIS — I959 Hypotension, unspecified: Secondary | ICD-10-CM | POA: Diagnosis not present

## 2023-10-03 DIAGNOSIS — D472 Monoclonal gammopathy: Secondary | ICD-10-CM | POA: Diagnosis not present

## 2023-10-03 DIAGNOSIS — R451 Restlessness and agitation: Secondary | ICD-10-CM | POA: Diagnosis not present

## 2023-10-03 DIAGNOSIS — J9601 Acute respiratory failure with hypoxia: Secondary | ICD-10-CM | POA: Diagnosis not present

## 2023-10-03 DIAGNOSIS — G20A1 Parkinson's disease without dyskinesia, without mention of fluctuations: Secondary | ICD-10-CM | POA: Diagnosis not present

## 2023-10-03 DIAGNOSIS — C9000 Multiple myeloma not having achieved remission: Secondary | ICD-10-CM | POA: Diagnosis not present

## 2023-10-03 DIAGNOSIS — R140 Abdominal distension (gaseous): Secondary | ICD-10-CM | POA: Diagnosis not present

## 2023-10-04 DIAGNOSIS — J9601 Acute respiratory failure with hypoxia: Secondary | ICD-10-CM | POA: Diagnosis not present

## 2023-10-04 DIAGNOSIS — N189 Chronic kidney disease, unspecified: Secondary | ICD-10-CM | POA: Diagnosis not present

## 2023-10-04 DIAGNOSIS — J189 Pneumonia, unspecified organism: Secondary | ICD-10-CM | POA: Diagnosis not present

## 2023-10-04 DIAGNOSIS — R140 Abdominal distension (gaseous): Secondary | ICD-10-CM | POA: Diagnosis not present

## 2023-10-04 DIAGNOSIS — J811 Chronic pulmonary edema: Secondary | ICD-10-CM | POA: Diagnosis not present

## 2023-10-04 DIAGNOSIS — G20A1 Parkinson's disease without dyskinesia, without mention of fluctuations: Secondary | ICD-10-CM | POA: Diagnosis not present

## 2023-10-04 DIAGNOSIS — Z4682 Encounter for fitting and adjustment of non-vascular catheter: Secondary | ICD-10-CM | POA: Diagnosis not present

## 2023-10-04 DIAGNOSIS — Z452 Encounter for adjustment and management of vascular access device: Secondary | ICD-10-CM | POA: Diagnosis not present

## 2023-10-04 DIAGNOSIS — Z9981 Dependence on supplemental oxygen: Secondary | ICD-10-CM | POA: Diagnosis not present

## 2023-10-04 DIAGNOSIS — N179 Acute kidney failure, unspecified: Secondary | ICD-10-CM | POA: Diagnosis not present

## 2023-10-04 DIAGNOSIS — C9000 Multiple myeloma not having achieved remission: Secondary | ICD-10-CM | POA: Diagnosis not present

## 2023-10-04 DIAGNOSIS — E859 Amyloidosis, unspecified: Secondary | ICD-10-CM | POA: Diagnosis not present

## 2023-10-05 DIAGNOSIS — R140 Abdominal distension (gaseous): Secondary | ICD-10-CM | POA: Diagnosis not present

## 2023-10-05 DIAGNOSIS — N189 Chronic kidney disease, unspecified: Secondary | ICD-10-CM | POA: Diagnosis not present

## 2023-10-05 DIAGNOSIS — E859 Amyloidosis, unspecified: Secondary | ICD-10-CM | POA: Diagnosis not present

## 2023-10-05 DIAGNOSIS — N179 Acute kidney failure, unspecified: Secondary | ICD-10-CM | POA: Diagnosis not present

## 2023-10-05 DIAGNOSIS — J811 Chronic pulmonary edema: Secondary | ICD-10-CM | POA: Diagnosis not present

## 2023-10-05 DIAGNOSIS — C9000 Multiple myeloma not having achieved remission: Secondary | ICD-10-CM | POA: Diagnosis not present

## 2023-10-05 DIAGNOSIS — I129 Hypertensive chronic kidney disease with stage 1 through stage 4 chronic kidney disease, or unspecified chronic kidney disease: Secondary | ICD-10-CM | POA: Diagnosis not present

## 2023-10-05 DIAGNOSIS — J9601 Acute respiratory failure with hypoxia: Secondary | ICD-10-CM | POA: Diagnosis not present

## 2023-10-05 DIAGNOSIS — R410 Disorientation, unspecified: Secondary | ICD-10-CM | POA: Diagnosis not present

## 2023-10-05 DIAGNOSIS — Z992 Dependence on renal dialysis: Secondary | ICD-10-CM | POA: Diagnosis not present

## 2023-10-05 DIAGNOSIS — I4891 Unspecified atrial fibrillation: Secondary | ICD-10-CM | POA: Diagnosis not present

## 2023-10-05 DIAGNOSIS — E875 Hyperkalemia: Secondary | ICD-10-CM | POA: Diagnosis not present

## 2023-10-05 DIAGNOSIS — G20A1 Parkinson's disease without dyskinesia, without mention of fluctuations: Secondary | ICD-10-CM | POA: Diagnosis not present

## 2023-10-06 DIAGNOSIS — E859 Amyloidosis, unspecified: Secondary | ICD-10-CM | POA: Diagnosis not present

## 2023-10-06 DIAGNOSIS — I129 Hypertensive chronic kidney disease with stage 1 through stage 4 chronic kidney disease, or unspecified chronic kidney disease: Secondary | ICD-10-CM | POA: Diagnosis not present

## 2023-10-06 DIAGNOSIS — I4891 Unspecified atrial fibrillation: Secondary | ICD-10-CM | POA: Diagnosis not present

## 2023-10-06 DIAGNOSIS — E8809 Other disorders of plasma-protein metabolism, not elsewhere classified: Secondary | ICD-10-CM | POA: Diagnosis not present

## 2023-10-06 DIAGNOSIS — G20A1 Parkinson's disease without dyskinesia, without mention of fluctuations: Secondary | ICD-10-CM | POA: Diagnosis not present

## 2023-10-06 DIAGNOSIS — J811 Chronic pulmonary edema: Secondary | ICD-10-CM | POA: Diagnosis not present

## 2023-10-06 DIAGNOSIS — N058 Unspecified nephritic syndrome with other morphologic changes: Secondary | ICD-10-CM | POA: Diagnosis not present

## 2023-10-06 DIAGNOSIS — J9601 Acute respiratory failure with hypoxia: Secondary | ICD-10-CM | POA: Diagnosis not present

## 2023-10-06 DIAGNOSIS — N185 Chronic kidney disease, stage 5: Secondary | ICD-10-CM | POA: Diagnosis not present

## 2023-10-06 DIAGNOSIS — I959 Hypotension, unspecified: Secondary | ICD-10-CM | POA: Diagnosis not present

## 2023-10-06 DIAGNOSIS — R451 Restlessness and agitation: Secondary | ICD-10-CM | POA: Diagnosis not present

## 2023-10-06 DIAGNOSIS — D649 Anemia, unspecified: Secondary | ICD-10-CM | POA: Diagnosis not present

## 2023-10-06 DIAGNOSIS — C9000 Multiple myeloma not having achieved remission: Secondary | ICD-10-CM | POA: Diagnosis not present

## 2023-10-06 DIAGNOSIS — I421 Obstructive hypertrophic cardiomyopathy: Secondary | ICD-10-CM | POA: Diagnosis not present

## 2023-10-06 DIAGNOSIS — N179 Acute kidney failure, unspecified: Secondary | ICD-10-CM | POA: Diagnosis not present

## 2023-10-07 DIAGNOSIS — J811 Chronic pulmonary edema: Secondary | ICD-10-CM | POA: Diagnosis not present

## 2023-10-07 DIAGNOSIS — N189 Chronic kidney disease, unspecified: Secondary | ICD-10-CM | POA: Diagnosis not present

## 2023-10-07 DIAGNOSIS — C9000 Multiple myeloma not having achieved remission: Secondary | ICD-10-CM | POA: Diagnosis not present

## 2023-10-07 DIAGNOSIS — N179 Acute kidney failure, unspecified: Secondary | ICD-10-CM | POA: Diagnosis not present

## 2023-10-07 DIAGNOSIS — I4891 Unspecified atrial fibrillation: Secondary | ICD-10-CM | POA: Diagnosis not present

## 2023-10-07 DIAGNOSIS — I129 Hypertensive chronic kidney disease with stage 1 through stage 4 chronic kidney disease, or unspecified chronic kidney disease: Secondary | ICD-10-CM | POA: Diagnosis not present

## 2023-10-07 DIAGNOSIS — Z5111 Encounter for antineoplastic chemotherapy: Secondary | ICD-10-CM | POA: Diagnosis not present

## 2023-10-07 DIAGNOSIS — G20A1 Parkinson's disease without dyskinesia, without mention of fluctuations: Secondary | ICD-10-CM | POA: Diagnosis not present

## 2023-10-07 DIAGNOSIS — R4182 Altered mental status, unspecified: Secondary | ICD-10-CM | POA: Diagnosis not present

## 2023-10-07 DIAGNOSIS — J9601 Acute respiratory failure with hypoxia: Secondary | ICD-10-CM | POA: Diagnosis not present

## 2023-10-07 DIAGNOSIS — E875 Hyperkalemia: Secondary | ICD-10-CM | POA: Diagnosis not present

## 2023-10-08 DIAGNOSIS — C9000 Multiple myeloma not having achieved remission: Secondary | ICD-10-CM | POA: Diagnosis not present

## 2023-10-08 DIAGNOSIS — G20A1 Parkinson's disease without dyskinesia, without mention of fluctuations: Secondary | ICD-10-CM | POA: Diagnosis not present

## 2023-10-08 DIAGNOSIS — E859 Amyloidosis, unspecified: Secondary | ICD-10-CM | POA: Diagnosis not present

## 2023-10-08 DIAGNOSIS — N189 Chronic kidney disease, unspecified: Secondary | ICD-10-CM | POA: Diagnosis not present

## 2023-10-08 DIAGNOSIS — I129 Hypertensive chronic kidney disease with stage 1 through stage 4 chronic kidney disease, or unspecified chronic kidney disease: Secondary | ICD-10-CM | POA: Diagnosis not present

## 2023-10-08 DIAGNOSIS — R600 Localized edema: Secondary | ICD-10-CM | POA: Diagnosis not present

## 2023-10-08 DIAGNOSIS — E875 Hyperkalemia: Secondary | ICD-10-CM | POA: Diagnosis not present

## 2023-10-08 DIAGNOSIS — I4891 Unspecified atrial fibrillation: Secondary | ICD-10-CM | POA: Diagnosis not present

## 2023-10-08 DIAGNOSIS — D649 Anemia, unspecified: Secondary | ICD-10-CM | POA: Diagnosis not present

## 2023-10-08 DIAGNOSIS — J9601 Acute respiratory failure with hypoxia: Secondary | ICD-10-CM | POA: Diagnosis not present

## 2023-10-09 DIAGNOSIS — G20A1 Parkinson's disease without dyskinesia, without mention of fluctuations: Secondary | ICD-10-CM | POA: Diagnosis not present

## 2023-10-09 DIAGNOSIS — I4891 Unspecified atrial fibrillation: Secondary | ICD-10-CM | POA: Diagnosis not present

## 2023-10-09 DIAGNOSIS — D649 Anemia, unspecified: Secondary | ICD-10-CM | POA: Diagnosis not present

## 2023-10-09 DIAGNOSIS — C9000 Multiple myeloma not having achieved remission: Secondary | ICD-10-CM | POA: Diagnosis not present

## 2023-10-09 DIAGNOSIS — N179 Acute kidney failure, unspecified: Secondary | ICD-10-CM | POA: Diagnosis not present

## 2023-10-09 DIAGNOSIS — I129 Hypertensive chronic kidney disease with stage 1 through stage 4 chronic kidney disease, or unspecified chronic kidney disease: Secondary | ICD-10-CM | POA: Diagnosis not present

## 2023-10-09 DIAGNOSIS — J9601 Acute respiratory failure with hypoxia: Secondary | ICD-10-CM | POA: Diagnosis not present

## 2023-10-09 DIAGNOSIS — N189 Chronic kidney disease, unspecified: Secondary | ICD-10-CM | POA: Diagnosis not present

## 2023-10-09 DIAGNOSIS — E875 Hyperkalemia: Secondary | ICD-10-CM | POA: Diagnosis not present

## 2023-10-09 DIAGNOSIS — E859 Amyloidosis, unspecified: Secondary | ICD-10-CM | POA: Diagnosis not present

## 2023-10-10 DIAGNOSIS — J9601 Acute respiratory failure with hypoxia: Secondary | ICD-10-CM | POA: Diagnosis not present

## 2023-10-10 DIAGNOSIS — J811 Chronic pulmonary edema: Secondary | ICD-10-CM | POA: Diagnosis not present

## 2023-10-10 DIAGNOSIS — G20A1 Parkinson's disease without dyskinesia, without mention of fluctuations: Secondary | ICD-10-CM | POA: Diagnosis not present

## 2023-10-10 DIAGNOSIS — R451 Restlessness and agitation: Secondary | ICD-10-CM | POA: Diagnosis not present

## 2023-10-10 DIAGNOSIS — E8589 Other amyloidosis: Secondary | ICD-10-CM | POA: Diagnosis not present

## 2023-10-10 DIAGNOSIS — Q213 Tetralogy of Fallot: Secondary | ICD-10-CM | POA: Diagnosis not present

## 2023-10-10 DIAGNOSIS — I129 Hypertensive chronic kidney disease with stage 1 through stage 4 chronic kidney disease, or unspecified chronic kidney disease: Secondary | ICD-10-CM | POA: Diagnosis not present

## 2023-10-10 DIAGNOSIS — I4891 Unspecified atrial fibrillation: Secondary | ICD-10-CM | POA: Diagnosis not present

## 2023-10-10 DIAGNOSIS — I959 Hypotension, unspecified: Secondary | ICD-10-CM | POA: Diagnosis not present

## 2023-10-11 DIAGNOSIS — J90 Pleural effusion, not elsewhere classified: Secondary | ICD-10-CM | POA: Diagnosis not present

## 2023-10-11 DIAGNOSIS — G20A1 Parkinson's disease without dyskinesia, without mention of fluctuations: Secondary | ICD-10-CM | POA: Diagnosis not present

## 2023-10-11 DIAGNOSIS — N179 Acute kidney failure, unspecified: Secondary | ICD-10-CM | POA: Diagnosis not present

## 2023-10-11 DIAGNOSIS — C9000 Multiple myeloma not having achieved remission: Secondary | ICD-10-CM | POA: Diagnosis not present

## 2023-10-11 DIAGNOSIS — E8589 Other amyloidosis: Secondary | ICD-10-CM | POA: Diagnosis not present

## 2023-10-11 DIAGNOSIS — R4182 Altered mental status, unspecified: Secondary | ICD-10-CM | POA: Diagnosis not present

## 2023-10-11 DIAGNOSIS — J9601 Acute respiratory failure with hypoxia: Secondary | ICD-10-CM | POA: Diagnosis not present

## 2023-10-11 DIAGNOSIS — I129 Hypertensive chronic kidney disease with stage 1 through stage 4 chronic kidney disease, or unspecified chronic kidney disease: Secondary | ICD-10-CM | POA: Diagnosis not present

## 2023-10-11 DIAGNOSIS — I959 Hypotension, unspecified: Secondary | ICD-10-CM | POA: Diagnosis not present

## 2023-10-11 DIAGNOSIS — Q213 Tetralogy of Fallot: Secondary | ICD-10-CM | POA: Diagnosis not present

## 2023-10-11 DIAGNOSIS — I4891 Unspecified atrial fibrillation: Secondary | ICD-10-CM | POA: Diagnosis not present

## 2023-10-11 DIAGNOSIS — J811 Chronic pulmonary edema: Secondary | ICD-10-CM | POA: Diagnosis not present

## 2023-10-12 DIAGNOSIS — N189 Chronic kidney disease, unspecified: Secondary | ICD-10-CM | POA: Diagnosis not present

## 2023-10-12 DIAGNOSIS — E859 Amyloidosis, unspecified: Secondary | ICD-10-CM | POA: Diagnosis not present

## 2023-10-12 DIAGNOSIS — N179 Acute kidney failure, unspecified: Secondary | ICD-10-CM | POA: Diagnosis not present

## 2023-10-12 DIAGNOSIS — R4182 Altered mental status, unspecified: Secondary | ICD-10-CM | POA: Diagnosis not present

## 2023-10-12 DIAGNOSIS — E875 Hyperkalemia: Secondary | ICD-10-CM | POA: Diagnosis not present

## 2023-10-12 DIAGNOSIS — R7989 Other specified abnormal findings of blood chemistry: Secondary | ICD-10-CM | POA: Diagnosis not present

## 2023-10-12 DIAGNOSIS — Z9981 Dependence on supplemental oxygen: Secondary | ICD-10-CM | POA: Diagnosis not present

## 2023-10-12 DIAGNOSIS — I4891 Unspecified atrial fibrillation: Secondary | ICD-10-CM | POA: Diagnosis not present

## 2023-10-12 DIAGNOSIS — G20A1 Parkinson's disease without dyskinesia, without mention of fluctuations: Secondary | ICD-10-CM | POA: Diagnosis not present

## 2023-10-13 DIAGNOSIS — N189 Chronic kidney disease, unspecified: Secondary | ICD-10-CM | POA: Diagnosis not present

## 2023-10-13 DIAGNOSIS — G20A1 Parkinson's disease without dyskinesia, without mention of fluctuations: Secondary | ICD-10-CM | POA: Diagnosis not present

## 2023-10-13 DIAGNOSIS — J690 Pneumonitis due to inhalation of food and vomit: Secondary | ICD-10-CM | POA: Diagnosis not present

## 2023-10-13 DIAGNOSIS — I480 Paroxysmal atrial fibrillation: Secondary | ICD-10-CM | POA: Diagnosis not present

## 2023-10-13 DIAGNOSIS — F0390 Unspecified dementia without behavioral disturbance: Secondary | ICD-10-CM | POA: Diagnosis not present

## 2023-10-13 DIAGNOSIS — C9000 Multiple myeloma not having achieved remission: Secondary | ICD-10-CM | POA: Diagnosis not present

## 2023-10-13 DIAGNOSIS — N179 Acute kidney failure, unspecified: Secondary | ICD-10-CM | POA: Diagnosis not present

## 2023-10-13 DIAGNOSIS — I21A1 Myocardial infarction type 2: Secondary | ICD-10-CM | POA: Diagnosis not present

## 2023-10-13 DIAGNOSIS — R579 Shock, unspecified: Secondary | ICD-10-CM | POA: Diagnosis not present

## 2023-10-23 ENCOUNTER — Ambulatory Visit: Payer: Medicare PPO | Attending: Medical | Admitting: Medical

## 2023-10-23 ENCOUNTER — Encounter: Payer: Self-pay | Admitting: Medical

## 2023-10-23 NOTE — Progress Notes (Deleted)
 Cardiology Office Note:    Date:  10/23/2023   ID:  Raymond [facility], DOB Feb 14, 1941, MRN [id number]  PCP:  Benjamine Aland, MD  [facility] Cardiologist:  None  [facility] Electrophysiologist:  None   Referring MD: Benjamine Aland, MD   Chief Complaint: hospital follow-up  History of Present Illness:    Raymond [facility] is a 83 y.o. male with a hx of bradycardia, paroxysmal Afib, HTN, prediabetes, Parkinson's, TIA who presents for hospital follow-up.   The patient presented to [facility] 09/06/23 with fatigue and weakness found to have AKI with Scr 7.82 felt to be due to poor oral intake and Telmisartan-hydrochlorothiazide. HS trop 541>524>575. This was felt to be from secondary ischemia. EKG showed SB, 53bpm, with LVH. He was started on IV heparin . Echo showed LVEF 60-65%, dilated left atrium, trivial MR. IVF d/c'd 09/13/23 and scr was 8.26. On 09/15/23 he was tachycardic noted to be in Afib RVR and given digoxin  and continued to have paroxymal Afib. The patient was started on amiodarone  taper and Eliquis.         No data to display             Past Medical History:  Diagnosis Date   Bradycardia    Diabetes mellitus without complication (HCC)    Hyperlipidemia    Hypertension    NUCLEAR STRESS TEST, 07/17/2007 - EKG negative ischemia   Memory loss    Murmur    2D ECHO, 02/04/2009 - EF 55-65%, normal   TIA (transient ischemic attack)     No past surgical history on file.  Current Medications: No outpatient medications have been marked as taking for the 10/23/23 encounter (Appointment) with Franchester, Tawny Raspberry H, PA-C.     Allergies:   Digoxin  and related   Social History   Socioeconomic History   Marital status: Married    Spouse name: Not on file   Number of children: Not on file   Years of education: Not on file   Highest education level: Not on file  Occupational History   Not on file  Tobacco Use   Smoking status: Never   Smokeless tobacco: Never   Tobacco comments:     smoked for about 2 weeks, gave him a headache  Substance and Sexual Activity   Alcohol use: No   Drug use: No   Sexual activity: Not on file  Other Topics Concern   Not on file  Social History Narrative   Not on file   Social Drivers of Health   Financial Resource Strain: Low Risk  (09/21/2023)   Received from [facility]   Overall Financial Resource Strain (CARDIA)    Difficulty of Paying Living Expenses: Not hard at all  Food Insecurity: No Food Insecurity (09/21/2023)   Received from [facility]   Hunger Vital Sign    Worried About Running Out of Food in the Last Year: Never true    Ran Out of Food in the Last Year: Never true  Transportation Needs: No Transportation Needs (09/21/2023)   Received from [facility]   PRAPARE - Transportation    Lack of Transportation (Medical): No    Lack of Transportation (Non-Medical): No  Physical Activity: Not on file  Stress: Not on file  Social Connections: Not on file     Family History: The patient's ***family history includes Heart attack in his mother; Stroke in his mother.  ROS:   Please see the history of present illness.    ***  All other systems reviewed and are negative.  EKGs/Labs/Other Studies Reviewed:    The following studies were reviewed today: ***  EKG:  EKG is *** ordered today.  The ekg ordered today demonstrates ***  Recent Labs: 09/06/2023: TSH 2.050 09/15/2023: Magnesium 1.7 09/20/2023: ALT 85; B Natriuretic Peptide 4,112.0; BUN 63; Creatinine, Ser 6.82; Hemoglobin 8.0; Platelets 231; Potassium 4.3; Sodium 135  Recent Lipid Panel No results found for: CHOL, TRIG, HDL, CHOLHDL, VLDL, LDLCALC, LDLDIRECT   Risk Assessment/Calculations:   {Does this patient have ATRIAL FIBRILLATION?:[id number]}   Physical Exam:    VS:  There were no vitals taken for this visit.    Wt Readings from Last 3 Encounters:  09/20/23 180 lb 1.9 oz (81.7 kg)  07/03/17 181 lb 6.4 oz (82.3 kg)   06/29/16 187 lb 9.6 oz (85.1 kg)     GEN: *** Well nourished, well developed in no acute distress HEENT: Normal NECK: No JVD; No carotid bruits LYMPHATICS: No lymphadenopathy CARDIAC: ***RRR, no murmurs, rubs, gallops RESPIRATORY:  Clear to auscultation without rales, wheezing or rhonchi  ABDOMEN: Soft, non-tender, non-distended MUSCULOSKELETAL:  No edema; No deformity  SKIN: Warm and dry NEUROLOGIC:  Alert and oriented x 3 PSYCHIATRIC:  Normal affect   ASSESSMENT:    No diagnosis found. PLAN:    In order of problems listed above:  ***  Disposition: Follow up {follow up:15908} with ***   Shared Decision Making/Informed Consent   {Are you ordering a CV Procedure (e.g. stress test, cath, DCCV, TEE, etc)?   Press F2        :[id number]}    Signed, Homer Pfeifer VEAR Franchester RIGGERS  10/23/2023 12:33 PM    [facility]
